# Patient Record
Sex: Female | Born: 1970
Health system: Southern US, Community
[De-identification: ages and names within clinical notes are randomized; demographics above are authoritative.]

## PROBLEM LIST (undated history)

## (undated) DIAGNOSIS — F32A Depression, unspecified: Secondary | ICD-10-CM

## (undated) DIAGNOSIS — I10 Essential (primary) hypertension: Secondary | ICD-10-CM

## (undated) DIAGNOSIS — F419 Anxiety disorder, unspecified: Secondary | ICD-10-CM

## (undated) DIAGNOSIS — F329 Major depressive disorder, single episode, unspecified: Secondary | ICD-10-CM

## (undated) DIAGNOSIS — C189 Malignant neoplasm of colon, unspecified: Secondary | ICD-10-CM

## (undated) HISTORY — DX: Anxiety disorder, unspecified: F41.9

## (undated) HISTORY — DX: Major depressive disorder, single episode, unspecified: F32.9

## (undated) HISTORY — DX: Depression, unspecified: F32.A

---

## 2004-10-12 ENCOUNTER — Emergency Department (HOSPITAL_COMMUNITY): Admission: EM | Admit: 2004-10-12 | Discharge: 2004-10-12 | Payer: Self-pay | Admitting: Emergency Medicine

## 2012-01-16 ENCOUNTER — Other Ambulatory Visit (HOSPITAL_COMMUNITY)
Admission: RE | Admit: 2012-01-16 | Discharge: 2012-01-16 | Disposition: A | Discharge status: Home / Self Care | Payer: Medicaid Other | Source: Ambulatory Visit | Attending: Obstetrics and Gynecology | Admitting: Obstetrics and Gynecology

## 2012-01-16 DIAGNOSIS — Z124 Encounter for screening for malignant neoplasm of cervix: Secondary | ICD-10-CM | POA: Insufficient documentation

## 2012-04-15 ENCOUNTER — Encounter (INDEPENDENT_AMBULATORY_CARE_PROVIDER_SITE_OTHER): Payer: Self-pay | Admitting: Surgery

## 2012-04-20 ENCOUNTER — Ambulatory Visit (INDEPENDENT_AMBULATORY_CARE_PROVIDER_SITE_OTHER): Payer: Self-pay | Admitting: Surgery

## 2012-05-04 ENCOUNTER — Encounter (INDEPENDENT_AMBULATORY_CARE_PROVIDER_SITE_OTHER): Payer: Self-pay | Admitting: Surgery

## 2012-07-27 ENCOUNTER — Encounter (HOSPITAL_COMMUNITY): Payer: Self-pay | Admitting: Emergency Medicine

## 2012-07-27 ENCOUNTER — Emergency Department (HOSPITAL_COMMUNITY)
Admission: EM | Admit: 2012-07-27 | Discharge: 2012-07-28 | Disposition: A | Discharge status: Home / Self Care | Payer: Medicaid Other | Attending: Emergency Medicine | Admitting: Emergency Medicine

## 2012-07-27 DIAGNOSIS — M549 Dorsalgia, unspecified: Secondary | ICD-10-CM

## 2012-07-27 DIAGNOSIS — Z79899 Other long term (current) drug therapy: Secondary | ICD-10-CM | POA: Insufficient documentation

## 2012-07-27 DIAGNOSIS — M546 Pain in thoracic spine: Secondary | ICD-10-CM | POA: Insufficient documentation

## 2012-07-27 DIAGNOSIS — R110 Nausea: Secondary | ICD-10-CM | POA: Insufficient documentation

## 2012-07-27 DIAGNOSIS — R61 Generalized hyperhidrosis: Secondary | ICD-10-CM | POA: Insufficient documentation

## 2012-07-27 DIAGNOSIS — I10 Essential (primary) hypertension: Secondary | ICD-10-CM | POA: Insufficient documentation

## 2012-07-27 HISTORY — DX: Essential (primary) hypertension: I10

## 2012-07-27 NOTE — ED Notes (Signed)
PT. REPORTS UPPER BACK PAIN ONSET LAST NIGHT WITH SLIGHT NAUSEA , DENIES INJURY OR FALL.

## 2012-07-28 ENCOUNTER — Emergency Department (HOSPITAL_COMMUNITY): Payer: Medicaid Other

## 2012-07-28 ENCOUNTER — Encounter (HOSPITAL_COMMUNITY): Payer: Self-pay | Admitting: Emergency Medicine

## 2012-07-28 MED ORDER — FAMOTIDINE 20 MG PO TABS: 20.0000 mg | ORAL_TABLET | Freq: Two times a day (BID) | ORAL | Status: DC

## 2012-07-28 MED ORDER — OMEPRAZOLE 20 MG PO CPDR: 20.0000 mg | DELAYED_RELEASE_CAPSULE | Freq: Every day | ORAL | Status: DC

## 2012-07-28 NOTE — ED Provider Notes (Signed)
Medical screening examination/treatment/procedure(s) were performed by non-physician practitioner and as supervising physician I was immediately available for consultation/collaboration.  Drakkar Medeiros K Marybel Alcott, MD 07/28/12 0404 

## 2012-07-28 NOTE — ED Notes (Signed)
Patient is alert and orientedx4.  Patient was explained discharge instructions and she understood with no questions.   

## 2012-07-28 NOTE — ED Provider Notes (Signed)
History     CSN: [id number]  Arrival date & time 07/27/12  2232   First MD Initiated Contact with Patient 07/27/12 2339      Chief Complaint  Patient presents with  . Back Pain   HPI  History provided by the patient. Patient is a 41 year old female with history of hypertension who presents with complaints of mid upper back pain that began last night. She reports resting and sitting at the time when she began to feel an ache and unusual pain to her left upper mid back area. Patient reports using over-the-counter pain medication for symptoms with some relief and went to sleep. Patient then reports being awoken by the same symptoms earlier this morning around 4 AM. Since that time symptoms have been waxing and waning. Symptoms have been associated with some nausea and sweats. Symptoms seem worse with lying in certain positions. Do not seem worse with any activities. Do not seem to be changed around eating her meal times. Patient denies having similar symptoms previously. She denies any injury or trauma. She denies any strenuous activity. Patient denies having any chest pain, shortness of breath, heart palpitations. Patient does not use any estrogen. She denies any hemoptysis. Denies any recent travel. Denies any previous history of DVT or PE. Pain is not pleuritic.    Past Medical History  Diagnosis Date  . Hypertension     Past Surgical History  Procedure Date  . Cesarean section     No family history on file.  History  Substance Use Topics  . Smoking status: Never Smoker   . Smokeless tobacco: Not on file  . Alcohol Use: No    OB History    Grav Para Term Preterm Abortions TAB SAB Ect Mult Living                  Review of Systems  Constitutional: Positive for diaphoresis. Negative for fever, chills and appetite change.  Respiratory: Negative for cough.   Cardiovascular: Negative for chest pain, palpitations and leg swelling.  Gastrointestinal: Positive for nausea.  Negative for vomiting, abdominal pain, diarrhea and constipation.  Genitourinary: Negative for dysuria, frequency, hematuria and flank pain.  Musculoskeletal: Positive for back pain.  Skin: Negative for rash.  Neurological: Negative for headaches.  All other systems reviewed and are negative.    Allergies  Alka-seltzer  Home Medications   Current Outpatient Rx  Name  Route  Sig  Dispense  Refill  . ASPIRIN 81 MG PO CHEW   Oral   Chew 81 mg by mouth daily as needed. For pain         . HYDROCHLOROTHIAZIDE 25 MG PO TABS   Oral   Take 25 mg by mouth every morning.         [name] METOPROLOL TARTRATE 25 MG PO TABS   Oral   Take 25 mg by mouth 2 (two) times daily.         [name] POTASSIUM CHLORIDE CRYS ER 20 MEQ PO TBCR   Oral   Take 20 mEq by mouth daily with breakfast.           BP 144/87  Pulse 87  Temp 98.9 F (37.2 C) (Oral)  Resp 14  SpO2 100%  LMP 07/22/2012  Physical Exam  Nursing note and vitals reviewed. Constitutional: She is oriented to person, place, and time. She appears well-developed and well-nourished. No distress.  HENT:  Head: Normocephalic and atraumatic.  Neck: Normal range of motion. Neck supple.  No cervical midline tenderness  Cardiovascular: Normal rate and regular rhythm.   No murmur heard. Pulmonary/Chest: Effort normal and breath sounds normal. No respiratory distress. She has no wheezes. She has no rales. She exhibits no tenderness.  Abdominal: Soft. There is no hepatosplenomegaly. There is no tenderness. There is no rebound, no guarding, no CVA tenderness, no tenderness at McBurney's point and negative Murphy's sign.  Musculoskeletal: She exhibits no edema and no tenderness.       Thoracic back: She exhibits tenderness. She exhibits normal range of motion and no bony tenderness.       Back:       Patient with mild tenderness over left but reports this feels different than current complaint symptoms.  Neurological: She is alert and  oriented to person, place, and time.  Skin: Skin is warm and dry.  Psychiatric: She has a normal mood and affect. Her behavior is normal.    ED Course  Procedures   Dg Chest 2 View  07/28/2012  *RADIOLOGY REPORT*  Clinical Data: Mid back pain for 24 hours  CHEST - 2 VIEW  Comparison: None  Findings: Normal heart size, mediastinal contours, and pulmonary vascularity. Lungs clear. Bones unremarkable. No pneumothorax.  IMPRESSION: No acute abnormalities.   Original Report Authenticated By: Ulyses Southward, M.D.      1. Back pain       MDM  11:45PM patient seen and evaluated. Patient appears well in no acute distress. Patient is PERC negative  Patient denies having any chest pain, shortness of breath or heart palpitations. Symptoms have been waxing and waning. They have been occurring at rest. Symptoms are atypical for ACS. Patient has a normal unremarkable EKG and chest x-ray. I discussed with patient possible causes of symptoms including muscle strain. She does appear to have slight tenderness to the back but reports this feels different from her symptoms. Patient is also having nausea and symptoms of sweating with BHS for muscle strain. I also discussed with patient possibilities of GERD.  At this time I recommended she use Tylenol with heat compress over the area as well as and antacid medicines to help see if symptoms improve. Patient will followup with PCP this week.     Date: 07/28/2012  Rate: 76  Rhythm: normal sinus rhythm  QRS Axis: normal  Intervals: normal  ST/T Wave abnormalities: normal  Conduction Disutrbances:none  Narrative Interpretation:   Old EKG Reviewed: none available      Angus Seller, PA 07/28/12 0126

## 2013-09-10 ENCOUNTER — Encounter (HOSPITAL_COMMUNITY): Payer: Self-pay | Admitting: Emergency Medicine

## 2013-09-10 ENCOUNTER — Emergency Department (HOSPITAL_COMMUNITY)
Admission: EM | Admit: 2013-09-10 | Discharge: 2013-09-10 | Disposition: A | Discharge status: Home / Self Care | Payer: Medicaid Other | Attending: Emergency Medicine | Admitting: Emergency Medicine

## 2013-09-10 DIAGNOSIS — IMO0001 Reserved for inherently not codable concepts without codable children: Secondary | ICD-10-CM | POA: Insufficient documentation

## 2013-09-10 DIAGNOSIS — Z79899 Other long term (current) drug therapy: Secondary | ICD-10-CM | POA: Insufficient documentation

## 2013-09-10 DIAGNOSIS — R509 Fever, unspecified: Secondary | ICD-10-CM | POA: Insufficient documentation

## 2013-09-10 DIAGNOSIS — J029 Acute pharyngitis, unspecified: Secondary | ICD-10-CM | POA: Insufficient documentation

## 2013-09-10 DIAGNOSIS — J069 Acute upper respiratory infection, unspecified: Secondary | ICD-10-CM | POA: Insufficient documentation

## 2013-09-10 DIAGNOSIS — I10 Essential (primary) hypertension: Secondary | ICD-10-CM | POA: Insufficient documentation

## 2013-09-10 DIAGNOSIS — R112 Nausea with vomiting, unspecified: Secondary | ICD-10-CM | POA: Insufficient documentation

## 2013-09-10 LAB — RAPID STREP SCREEN (MED CTR MEBANE ONLY): Streptococcus, Group A Screen (Direct): NEGATIVE

## 2013-09-10 MED ORDER — ONDANSETRON HCL 4 MG PO TABS: 4.0000 mg | ORAL_TABLET | Freq: Four times a day (QID) | ORAL | Status: DC

## 2013-09-10 MED ORDER — HYDROCOD POLST-CHLORPHEN POLST 10-8 MG/5ML PO LQCR
5.0000 mL | Freq: Once | ORAL | Status: AC
  Administered 2013-09-10: 5 mL via ORAL
  Filled 2013-09-10: qty 5

## 2013-09-10 MED ORDER — HYDROCOD POLST-CHLORPHEN POLST 10-8 MG/5ML PO LQCR: 5.0000 mL | Freq: Every evening | ORAL | Status: DC | PRN

## 2013-09-10 NOTE — ED Provider Notes (Signed)
CSN: [id number]     Arrival date & time 09/10/13  1607 History   This chart was scribed for non-physician practitioner working with [place], DO by Mercy Moore, ED Scribe. This patient was seen in room WTR5/WTR5 and the patient's care was started at 5:55 PM.   Chief Complaint  Patient presents with  . Influenza   The history is provided by the patient. No language interpreter was used.   HPI Comments: Brittney Young is a 43 y.o. female who presents to the Emergency Department complaining of influenza like symptoms: sore, itchy throat, rhinorrhea, non productive burning cough, body aches, fever onset 4 days ago. Maximum temperature at home measured at 100.3 F. ED temperature measured at 99.8 F. Patient reports 4/5 episodes of emesis yesterday after feeling nauseated. Patient has been managing symptoms with OTC medications, but has not taken anything today. Patient states that her cough is worse when lying down. Patient reports no history of asthma. Nonsmoker. Patient has history of HTN.   Past Medical History  Diagnosis Date  . Hypertension    Past Surgical History  Procedure Laterality Date  . Cesarean section     History reviewed. No pertinent family history. History  Substance Use Topics  . Smoking status: Never Smoker   . Smokeless tobacco: Not on file  . Alcohol Use: No   OB History   Grav Para Term Preterm Abortions TAB SAB Ect Mult Living                 Review of Systems  Constitutional: Positive for fever.  HENT: Positive for rhinorrhea and sore throat.   Respiratory: Positive for cough.   Gastrointestinal: Positive for nausea.  Musculoskeletal: Positive for myalgias.    Allergies  Review of patient's allergies indicates no known allergies.  Home Medications   Current Outpatient Rx  Name  Route  Sig  Dispense  Refill  . hydrochlorothiazide (HYDRODIURIL) 25 MG tablet   Oral   Take 25 mg by mouth every morning.         . metoprolol tartrate  (LOPRESSOR) 25 MG tablet   Oral   Take 25 mg by mouth 2 (two) times daily.         [name] Phenyleph-CPM-DM-APAP (ALKA-SELTZER PLUS COLD & FLU) 01-07-09-250 MG TBEF   Oral   Take 2 tablets by mouth as needed (cold symptoms).         . potassium chloride SA (K-DUR,KLOR-CON) 20 MEQ tablet   Oral   Take 20 mEq by mouth daily with breakfast.          Triage Vitals: BP 141/75  Pulse 100  Temp(Src) 99.8 F (37.7 C) (Oral)  Resp 25  SpO2 100%  LMP 08/17/2013 Physical Exam  Nursing note and vitals reviewed. Constitutional: She is oriented to person, place, and time. She appears well-developed and well-nourished. No distress.  HENT:  Head: Normocephalic and atraumatic.  Nose: Mucosal edema present.  Mouth/Throat: Posterior oropharyngeal erythema present. No oropharyngeal exudate.  Pharynx red without exudate. Nasal mucousa Minimally swollen.   Eyes: EOM are normal.  Neck: Neck supple. No tracheal deviation present.  Cardiovascular: Normal rate and regular rhythm.   No murmur heard. Pulmonary/Chest: Effort normal and breath sounds normal. No respiratory distress. She has no wheezes. She has no rales.  Musculoskeletal: Normal range of motion.  Neurological: She is alert and oriented to person, place, and time.  Skin: Skin is warm and dry. No rash noted.  Psychiatric: She has  a normal mood and affect. Her behavior is normal.    ED Course  Procedures (including critical care time) DIAGNOSTIC STUDIES: Oxygen Saturation is 100% on room air, normal by my interpretation.    COORDINATION OF CARE: 5:59 PM- Will order strep screen and prescribe Tussionex. Pt advised of plan for treatment and pt agrees.    Labs Review Labs Reviewed - No data to display Imaging Review No results found.  EKG Interpretation   None       MDM  No diagnosis found. 1. URI 2. Pharyngitis  Well appearing patient, non-toxic. Symptomatic treatment recommended.  I personally performed the services  described in this documentation, which was scribed in my presence. The recorded information has been reviewed and is accurate.     Dewaine Oats, PA-C 09/11/13 1647

## 2013-09-10 NOTE — Discharge Instructions (Signed)
Cough, Adult  A cough is a reflex that helps clear your throat and airways. It can help heal the body or may be a reaction to an irritated airway. A cough may only last 2 or 3 weeks (acute) or may last more than 8 weeks (chronic).  CAUSES Acute cough:  Viral or bacterial infections. Chronic cough:  Infections.  Allergies.  Asthma.  Post-nasal drip.  Smoking.  Heartburn or acid reflux.  Some medicines.  Chronic lung problems (COPD).  Cancer. SYMPTOMS   Cough.  Fever.  Chest pain.  Increased breathing rate.  High-pitched whistling sound when breathing (wheezing).  Colored mucus that you cough up (sputum). TREATMENT   A bacterial cough may be treated with antibiotic medicine.  A viral cough must run its course and will not respond to antibiotics.  Your caregiver may recommend other treatments if you have a chronic cough. HOME CARE INSTRUCTIONS   Only take over-the-counter or prescription medicines for pain, discomfort, or fever as directed by your caregiver. Use cough suppressants only as directed by your caregiver.  Use a cold steam vaporizer or humidifier in your bedroom or home to help loosen secretions.  Sleep in a semi-upright position if your cough is worse at night.  Rest as needed.  Stop smoking if you smoke. SEEK IMMEDIATE MEDICAL CARE IF:   You have pus in your sputum.  Your cough starts to worsen.  You cannot control your cough with suppressants and are losing sleep.  You begin coughing up blood.  You have difficulty breathing.  You develop pain which is getting worse or is uncontrolled with medicine.  You have a fever. MAKE SURE YOU:   Understand these instructions.  Will watch your condition.  Will get help right away if you are not doing well or get worse. Document Released: 02/21/2011 Document Revised: 11/17/2011 Document Reviewed: 02/21/2011 [facility] Patient Information 2014 [place].  Antibiotic Nonuse  Your  caregiver felt that the infection or problem was not one that would be helped with an antibiotic. Infections may be caused by viruses or bacteria. Only a caregiver can tell which one of these is the likely cause of an illness. A cold is the most common cause of infection in both adults and children. A cold is a virus. Antibiotic treatment will have no effect on a viral infection. Viruses can lead to many lost days of work caring for sick children and many missed days of school. Children may catch as many as 10 "colds" or "flus" per year during which they can be tearful, cranky, and uncomfortable. The goal of treating a virus is aimed at keeping the ill person comfortable. Antibiotics are medications used to help the body fight bacterial infections. There are relatively few types of bacteria that cause infections but there are hundreds of viruses. While both viruses and bacteria cause infection they are very different types of germs. A viral infection will typically go away by itself within 7 to 10 days. Bacterial infections may spread or get worse without antibiotic treatment. Examples of bacterial infections are:  Sore throats (like strep throat or tonsillitis).  Infection in the lung (pneumonia).  Ear and skin infections. Examples of viral infections are:  Colds or flus.  Most coughs and bronchitis.  Sore throats not caused by Strep.  Runny noses. It is often best not to take an antibiotic when a viral infection is the cause of the problem. Antibiotics can kill off the helpful bacteria that we have inside our body  and allow harmful bacteria to start growing. Antibiotics can cause side effects such as allergies, nausea, and diarrhea without helping to improve the symptoms of the viral infection. Additionally, repeated uses of antibiotics can cause bacteria inside of our body to become resistant. That resistance can be passed onto harmful bacterial. The next time you have an infection it may be  harder to treat if antibiotics are used when they are not needed. Not treating with antibiotics allows our own immune system to develop and take care of infections more efficiently. Also, antibiotics will work better for [place] when they are prescribed for bacterial infections. Treatments for a child that is ill may include:  Give extra fluids throughout the day to stay hydrated.  Get plenty of rest.  Only give your child over-the-counter or prescription medicines for pain, discomfort, or fever as directed by your caregiver.  The use of a cool mist humidifier may help stuffy noses.  Cold medications if suggested by your caregiver. Your caregiver may decide to start you on an antibiotic if:  The problem you were seen for today continues for a longer length of time than expected.  You develop a secondary bacterial infection. SEEK MEDICAL CARE IF:  Fever lasts longer than 5 days.  Symptoms continue to get worse after 5 to 7 days or become severe.  Difficulty in breathing develops.  Signs of dehydration develop (poor drinking, rare urinating, dark colored urine).  Changes in behavior or worsening tiredness (listlessness or lethargy). Document Released: 11/03/2001 Document Revised: 11/17/2011 Document Reviewed: 05/02/2009 [facility] Patient Information 2014 [place], [place]. Sore Throat A sore throat is pain, burning, irritation, or scratchiness of the throat. There is often pain or tenderness when swallowing or talking. A sore throat may be accompanied by other symptoms, such as coughing, sneezing, fever, and swollen neck glands. A sore throat is often the first sign of another sickness, such as a cold, flu, strep throat, or mononucleosis (commonly known as mono). Most sore throats go away without medical treatment. CAUSES  The most common causes of a sore throat include:  A viral infection, such as a cold, flu, or mono.  A bacterial infection, such as strep throat, tonsillitis, or whooping  cough.  Seasonal allergies.  Dryness in the air.  Irritants, such as smoke or pollution.  Gastroesophageal reflux disease (GERD). HOME CARE INSTRUCTIONS   Only take over-the-counter medicines as directed by your caregiver.  Drink enough fluids to keep your urine clear or pale yellow.  Rest as needed.  Try using throat sprays, lozenges, or sucking on hard candy to ease any pain (if older than 4 years or as directed).  Sip warm liquids, such as broth, herbal tea, or warm water with honey to relieve pain temporarily. You may also eat or drink cold or frozen liquids such as frozen ice pops.  Gargle with salt water (mix 1 tsp salt with 8 oz of water).  Do not smoke and avoid secondhand smoke.  Put a cool-mist humidifier in your bedroom at night to moisten the air. You can also turn on a hot shower and sit in the bathroom with the door closed for 5 10 minutes. SEEK IMMEDIATE MEDICAL CARE IF:  You have difficulty breathing.  You are unable to swallow fluids, soft foods, or your saliva.  You have increased swelling in the throat.  Your sore throat does not get better in 7 days.  You have nausea and vomiting.  You have a fever or persistent symptoms for more  than 2 3 days.  You have a fever and your symptoms suddenly get worse. MAKE SURE YOU:   Understand these instructions.  Will watch your condition.  Will get help right away if you are not doing well or get worse. Document Released: 10/02/2004 Document Revised: 08/11/2012 Document Reviewed: 05/02/2012 [facility] Patient Information 2014 [place], [place]. Salt Water Gargle This solution will help make your mouth and throat feel better. HOME CARE INSTRUCTIONS   Mix 1 teaspoon of salt in 8 ounces of warm water.  Gargle with this solution as much or often as you need or as directed. Swish and gargle gently if you have any sores or wounds in your mouth.  Do not swallow this mixture. Document Released: 05/29/2004 Document  Revised: 11/17/2011 Document Reviewed: 10/20/2008 [facility] Patient Information 2014 [place].

## 2013-09-10 NOTE — ED Notes (Signed)
She c/o congested cough and body aches, esp. Of low back; x 3-4 days.  She states she vomited a couple of time with cough yesterday--denies any vomiting today.  She is in no distress.

## 2013-09-11 NOTE — ED Provider Notes (Signed)
Medical screening examination/treatment/procedure(s) were performed by non-physician practitioner and as supervising physician I was immediately available for consultation/collaboration.  EKG Interpretation   None         [place], DO 09/11/13 2341

## 2013-09-12 LAB — CULTURE, GROUP A STREP

## 2013-09-13 ENCOUNTER — Ambulatory Visit: Payer: BC Managed Care – PPO | Admitting: Family Medicine

## 2013-09-13 VITALS — BP 150/90 | HR 81 | Temp 98.2°F | Resp 16 | Ht 64.5 in | Wt 175.2 lb

## 2013-09-13 DIAGNOSIS — R059 Cough, unspecified: Secondary | ICD-10-CM

## 2013-09-13 DIAGNOSIS — J069 Acute upper respiratory infection, unspecified: Secondary | ICD-10-CM

## 2013-09-13 DIAGNOSIS — J019 Acute sinusitis, unspecified: Secondary | ICD-10-CM

## 2013-09-13 DIAGNOSIS — R05 Cough: Secondary | ICD-10-CM

## 2013-09-13 MED ORDER — AMOXICILLIN-POT CLAVULANATE 875-125 MG PO TABS: 1.0000 | ORAL_TABLET | Freq: Two times a day (BID) | ORAL | Status: DC

## 2013-09-13 MED ORDER — BENZONATATE 100 MG PO CAPS: 200.0000 mg | ORAL_CAPSULE | Freq: Two times a day (BID) | ORAL | Status: DC | PRN

## 2013-09-13 NOTE — Progress Notes (Signed)
Chief Complaint:  Chief Complaint  Patient presents with  . Cough    was in the ER dxd with URI, not feeling better  . Dizziness    when she moves around, facial tightness  . URI    HPI: Brittney Young is a 43 y.o. female who is here for  1 week history of flu like symptoms, back pain between shoulder  Worse with coughing, she has had some intermittent dizziness Has a cough but not coughing much, back pain and dizziness worse with coughing, she is coughing up green sputum She has had fever T max was 100.3, last one was  Saturday She still feels really bad but not worse She was vomiting before, currently no diarrhea, no rashes.  She was seen in ED for sore throat, and flu like sxs but rapid strp was doen and that was it. No flu test or other blood work The rapid strep test was negative.  Has not had flu vaccine,  No other sick contacts.  She did not take her BP meds today Has been out of work for several days due to illness and would like a note  Past Medical History  Diagnosis Date  . Hypertension   . Depression   . Anxiety    Past Surgical History  Procedure Laterality Date  . Cesarean section     History   Social History  . Marital Status: Single    Spouse Name: N/A    Number of Children: N/A  . Years of Education: N/A   Social History Main Topics  . Smoking status: Never Smoker   . Smokeless tobacco: None  . Alcohol Use: No  . Drug Use: No  . Sexual Activity: None   Other Topics Concern  . None   Social History Narrative  . None   Family History  Problem Relation Age of Onset  . Stroke Mother   . Cancer Sister    No Known Allergies Prior to Admission medications   Medication Sig Start Date End Date Taking? Authorizing Provider  chlorpheniramine-HYDROcodone (TUSSIONEX PENNKINETIC ER) 10-8 MG/5ML LQCR Take 5 mLs by mouth at bedtime as needed for cough. 09/10/13   Shari A Upstill, PA-C  hydrochlorothiazide (HYDRODIURIL) 25 MG tablet Take 25  mg by mouth every morning.    Historical Provider, MD  metoprolol tartrate (LOPRESSOR) 25 MG tablet Take 25 mg by mouth 2 (two) times daily.    Historical Provider, MD  ondansetron (ZOFRAN) 4 MG tablet Take 1 tablet (4 mg total) by mouth every 6 (six) hours. 09/10/13   Shari A Upstill, PA-C  Phenyleph-CPM-DM-APAP (ALKA-SELTZER PLUS COLD & FLU) 01-07-09-250 MG TBEF Take 2 tablets by mouth as needed (cold symptoms).    Historical Provider, MD  potassium chloride SA (K-DUR,KLOR-CON) 20 MEQ tablet Take 20 mEq by mouth daily with breakfast.    Historical Provider, MD     ROS: The patient denies  night sweats, unintentional weight loss, chest pain, palpitations, wheezing, dyspnea on exertion,   abdominal pain, dysuria, hematuria, melena, numbness,  or tingling.   All other systems have been reviewed and were otherwise negative with the exception of those mentioned in the HPI and as above.    PHYSICAL EXAM: Filed Vitals:   09/13/13 1741  BP: 150/90  Pulse: 81  Temp: 98.2 F (36.8 C)  Resp: 16  Spo2 100% Filed Vitals:   09/13/13 1741  Height: 5' 4.5" (1.638 m)  Weight: 175 lb 3.2 oz (  79.47 kg)   Body mass index is 29.62 kg/(m^2).  General: Alert, no acute distress, looks tired HEENT:  Normocephalic, atraumatic, oropharynx patent. EOMI, PERRLA. Tm nl. No exudates. + sinus tenderness.  Cardiovascular:  Regular rate and rhythm, no rubs murmurs or gallops.  No Carotid bruits, radial pulse intact. No pedal edema.  Respiratory: Clear to auscultation bilaterally.  No wheezes, rales, or rhonchi.  No cyanosis, no use of accessory musculature GI: No organomegaly, abdomen is soft and non-tender, positive bowel sounds.  No masses. Skin: No rashes. Neurologic: Facial musculature symmetric. CN 2-12 grossly intact Psychiatric: Patient is appropriate throughout our interaction. Lymphatic: No cervical lymphadenopathy Musculoskeletal: Gait intact.   LABS: Results for orders placed during the hospital  encounter of 09/10/13  RAPID STREP SCREEN      Result Value Range   Streptococcus, Group A Screen (Direct) NEGATIVE  NEGATIVE  CULTURE, GROUP A STREP      Result Value Range   Specimen Description THROAT     Special Requests NONE     Culture       Value: No Beta Hemolytic Streptococci Isolated     Performed at [facility]   Report Status 09/12/2013 FINAL       EKG/XRAY:   Primary read interpreted by Dr. Marin Comment at [facility].   ASSESSMENT/PLAN: Encounter Diagnoses  Name Primary?  . URI, acute Yes  . Acute sinusitis   . Cough     Most likely viral with some sinusitis, has been afebrile so will not do anything more aggressive, sxs are not worse,she just feels she is not improving as fast as she would like Will rx augmentin for acute sinusitis Otc nasocort, continue with rx cough meds She will take BP meds when she is feeling better, push fluids, get up and down slowly, orthostatic BP nl F/u prn Work note given  Gross sideeffects, risk and benefits, and alternatives of medications d/w patient. Patient is aware that all medications have potential sideeffects and we are unable to predict every sideeffect or drug-drug interaction that may occur.  Lavenia Stumpo, [place], DO 09/13/2013 6:29 PM

## 2014-03-08 ENCOUNTER — Other Ambulatory Visit (HOSPITAL_COMMUNITY): Payer: Self-pay | Admitting: Physician Assistant

## 2014-03-08 ENCOUNTER — Telehealth (HOSPITAL_COMMUNITY): Payer: Self-pay | Admitting: *Deleted

## 2014-03-08 DIAGNOSIS — R079 Chest pain, unspecified: Secondary | ICD-10-CM

## 2014-03-14 ENCOUNTER — Telehealth (HOSPITAL_COMMUNITY): Payer: Self-pay

## 2014-03-14 NOTE — Telephone Encounter (Signed)
Encounter complete. 

## 2014-03-15 ENCOUNTER — Telehealth (HOSPITAL_COMMUNITY): Payer: Self-pay

## 2014-03-15 NOTE — Telephone Encounter (Signed)
Encounter complete. 

## 2014-03-16 ENCOUNTER — Ambulatory Visit (HOSPITAL_COMMUNITY)
Admission: RE | Admit: 2014-03-16 | Discharge: 2014-03-16 | Disposition: A | Discharge status: Home / Self Care | Payer: BC Managed Care – PPO | Source: Ambulatory Visit | Attending: Internal Medicine | Admitting: Internal Medicine

## 2014-03-16 DIAGNOSIS — R079 Chest pain, unspecified: Secondary | ICD-10-CM | POA: Diagnosis not present

## 2014-03-16 NOTE — Procedures (Signed)
Exercise Treadmill Test   Test  Exercise Tolerance Test Ordering MD: Lennie Odor, PA-C  Interpreting MD:  Unique Test No: 1   Treadmill:  1  Indication for ETT: Dizziness  Contraindication to ETT: Yes   Stress Modality: exercise - treadmill  Cardiac Imaging Performed: non   Protocol: standard Bruce - maximal  Max BP:  157/84  Max MPHR (bpm): 179 85% MPR (bpm): 152  MPHR obtained (bpm):  164 % MPHR obtained:  91  Reached 85% MPHR (min:sec):  5:30 Total Exercise Time (min-sec): 6:14  Workload in METS: 7.30 Borg Scale:   Reason ETT Terminated:  fatigue    ST Segment Analysis At Rest: NSR, nonspecific ST changes. With Exercise: no evidence of significant ST depression  Other Information Arrhythmia:  No Angina during ETT:  absent (0) Quality of ETT:  diagnostic  ETT Interpretation:  normal - no evidence of ischemia by ST analysis  Comments: ETT with no chest pain; fair exercise capacity; normal BP response; no ST changes; negative adequate ETT.  Kirk Ruths

## 2014-04-14 ENCOUNTER — Other Ambulatory Visit: Payer: Self-pay | Admitting: Physician Assistant

## 2014-04-14 ENCOUNTER — Other Ambulatory Visit (HOSPITAL_COMMUNITY)
Admission: RE | Admit: 2014-04-14 | Discharge: 2014-04-14 | Disposition: A | Discharge status: Home / Self Care | Payer: BC Managed Care – PPO | Source: Ambulatory Visit | Attending: Family Medicine | Admitting: Family Medicine

## 2014-04-14 DIAGNOSIS — N6320 Unspecified lump in the left breast, unspecified quadrant: Secondary | ICD-10-CM

## 2014-04-14 DIAGNOSIS — Z124 Encounter for screening for malignant neoplasm of cervix: Secondary | ICD-10-CM | POA: Insufficient documentation

## 2014-04-18 LAB — CYTOLOGY - PAP

## 2014-04-19 ENCOUNTER — Ambulatory Visit
Admission: RE | Admit: 2014-04-19 | Discharge: 2014-04-19 | Disposition: A | Discharge status: Home / Self Care | Payer: BC Managed Care – PPO | Source: Ambulatory Visit | Attending: Physician Assistant | Admitting: Physician Assistant

## 2014-04-19 ENCOUNTER — Other Ambulatory Visit: Payer: Self-pay | Admitting: Physician Assistant

## 2014-04-19 DIAGNOSIS — N6320 Unspecified lump in the left breast, unspecified quadrant: Secondary | ICD-10-CM

## 2014-04-24 ENCOUNTER — Other Ambulatory Visit: Payer: Self-pay | Admitting: Physician Assistant

## 2014-04-24 DIAGNOSIS — N6320 Unspecified lump in the left breast, unspecified quadrant: Secondary | ICD-10-CM

## 2014-04-26 ENCOUNTER — Ambulatory Visit
Admission: RE | Admit: 2014-04-26 | Discharge: 2014-04-26 | Disposition: A | Discharge status: Home / Self Care | Payer: BC Managed Care – PPO | Source: Ambulatory Visit | Attending: Physician Assistant | Admitting: Physician Assistant

## 2014-04-26 DIAGNOSIS — N6320 Unspecified lump in the left breast, unspecified quadrant: Secondary | ICD-10-CM

## 2014-04-27 ENCOUNTER — Other Ambulatory Visit: Payer: Self-pay | Admitting: Physician Assistant

## 2014-04-27 DIAGNOSIS — R921 Mammographic calcification found on diagnostic imaging of breast: Secondary | ICD-10-CM

## 2014-04-28 ENCOUNTER — Ambulatory Visit
Admission: RE | Admit: 2014-04-28 | Discharge: 2014-04-28 | Disposition: A | Discharge status: Home / Self Care | Payer: BC Managed Care – PPO | Source: Ambulatory Visit | Attending: Physician Assistant | Admitting: Physician Assistant

## 2014-04-28 DIAGNOSIS — R921 Mammographic calcification found on diagnostic imaging of breast: Secondary | ICD-10-CM

## 2014-06-30 ENCOUNTER — Other Ambulatory Visit: Payer: Self-pay | Admitting: Physician Assistant

## 2014-06-30 ENCOUNTER — Ambulatory Visit (INDEPENDENT_AMBULATORY_CARE_PROVIDER_SITE_OTHER): Payer: BC Managed Care – PPO | Admitting: Physician Assistant

## 2014-06-30 ENCOUNTER — Encounter: Payer: Self-pay | Admitting: Physician Assistant

## 2014-06-30 VITALS — BP 133/88 | HR 82 | Ht 64.5 in | Wt 174.0 lb

## 2014-06-30 DIAGNOSIS — A6000 Herpesviral infection of urogenital system, unspecified: Secondary | ICD-10-CM

## 2014-06-30 DIAGNOSIS — R5383 Other fatigue: Secondary | ICD-10-CM | POA: Diagnosis not present

## 2014-06-30 DIAGNOSIS — I10 Essential (primary) hypertension: Secondary | ICD-10-CM

## 2014-06-30 NOTE — Progress Notes (Signed)
   Subjective:    Patient ID: Brittney Young, female    DOB: 06/28/1971, 43 y.o.   MRN: [id number]  HPI  Patient is a 43 year old female who presents to the clinic to establish care.  .. Active Ambulatory Problems    Diagnosis Date Noted  . Essential hypertension, benign 06/30/2014  . Genital herpes 06/30/2014  . Anemia 07/03/2014  . Vitamin D deficiency 07/03/2014  . Other fatigue 07/03/2014   Resolved Ambulatory Problems    Diagnosis Date Noted  . No Resolved Ambulatory Problems   Past Medical History  Diagnosis Date  . Hypertension   . Depression   . Anxiety    .[name] Family History  Problem Relation Age of Onset  . Stroke Mother   . Cancer Sister    .[name] History   Social History  . Marital Status: Single    Spouse Name: N/A    Number of Children: N/A  . Years of Education: N/A   Occupational History  . Not on file.   Social History Main Topics  . Smoking status: Never Smoker   . Smokeless tobacco: Not on file  . Alcohol Use: No  . Drug Use: No  . Sexual Activity: Not on file   Other Topics Concern  . Not on file   Social History Narrative  . No narrative on file   Hypertension-patient would like refills on her medication. She denies any chest pain, palpitations, headaches or vision changes.  Patient does have a history of anemia. This has not been checked recently. She complains of ongoing fatigue. She also admits to working second shift and getting 3-4 hours of rest at night. She also has heavy periods that are regular. She is currently on her cycle.  Patient also reports a history of general herpes. She occasionally will have outbreaks that are very painful and treatment leaves her not to be able to work. She'll like to be written out for 1-2 days as needed a month for FMLA.     Review of Systems  All other systems reviewed and are negative.      Objective:   Physical Exam  Constitutional: She is oriented to person, place, and time. She  appears well-developed and well-nourished.  HENT:  Head: Normocephalic and atraumatic.  Cardiovascular: Normal rate, regular rhythm and normal heart sounds.   Pulmonary/Chest: Effort normal and breath sounds normal. She has no wheezes.  Neurological: She is alert and oriented to person, place, and time.  Skin: Skin is dry.  Psychiatric: She has a normal mood and affect. Her behavior is normal.          Assessment & Plan:  HTN-Refilled medications for 6 months.  Fatigue/anemia- ordered labs today to evaluate thyroid, CBC, ferritin, B12, vitamin D and CMP. I do think a lot of patient's fatigue could be coming from her sleeping. She works second shift and then has a young child to which she is up a lot of nights for. I'm not sure there is anything she can do about this except change her shift at work. Discussed with patient importance of sleep.  General herpes-discuss with patient that are needed records proving diagnosis. Also she like FMLA papers filled out I need a separate visit because I do not have time with establishing of care to fill out FMLA paperwork.  Per pt has had fasting labs at [facility] in September. Would like to have records to update system.

## 2014-07-01 LAB — CBC WITH DIFFERENTIAL/PLATELET
BASOS PCT: 0 % (ref 0–1)
Basophils Absolute: 0 10*3/uL (ref 0.0–0.1)
Eosinophils Absolute: 0 10*3/uL (ref 0.0–0.7)
Eosinophils Relative: 0 % (ref 0–5)
HEMATOCRIT: 32.2 % — AB (ref 36.0–46.0)
Hemoglobin: 10.3 g/dL — ABNORMAL LOW (ref 12.0–15.0)
LYMPHS ABS: 1.5 10*3/uL (ref 0.7–4.0)
LYMPHS PCT: 33 % (ref 12–46)
MCH: 29.9 pg (ref 26.0–34.0)
MCHC: 32 g/dL (ref 30.0–36.0)
MCV: 93.6 fL (ref 78.0–100.0)
Monocytes Absolute: 0.2 10*3/uL (ref 0.1–1.0)
Monocytes Relative: 5 % (ref 3–12)
Neutro Abs: 2.8 10*3/uL (ref 1.7–7.7)
Neutrophils Relative %: 62 % (ref 43–77)
Platelets: 233 10*3/uL (ref 150–400)
RBC: 3.44 MIL/uL — AB (ref 3.87–5.11)
RDW: 15.5 % (ref 11.5–15.5)
WBC: 4.5 10*3/uL (ref 4.0–10.5)

## 2014-07-01 LAB — IBC PANEL
%SAT: 18 % — AB (ref 20–55)
TIBC: 281 ug/dL (ref 250–470)
UIBC: 230 ug/dL (ref 125–400)

## 2014-07-01 LAB — IRON: Iron: 51 ug/dL (ref 42–145)

## 2014-07-01 LAB — TSH: TSH: 1.942 u[IU]/mL (ref 0.350–4.500)

## 2014-07-01 LAB — VITAMIN B12: Vitamin B-12: 356 pg/mL (ref 211–911)

## 2014-07-01 LAB — FERRITIN: Ferritin: 40 ng/mL (ref 10–291)

## 2014-07-01 LAB — VITAMIN D 25 HYDROXY (VIT D DEFICIENCY, FRACTURES): Vit D, 25-Hydroxy: 13 ng/mL — ABNORMAL LOW (ref 30–89)

## 2014-07-03 ENCOUNTER — Other Ambulatory Visit: Payer: Self-pay | Admitting: Physician Assistant

## 2014-07-03 ENCOUNTER — Encounter: Payer: Self-pay | Admitting: Physician Assistant

## 2014-07-03 DIAGNOSIS — E559 Vitamin D deficiency, unspecified: Secondary | ICD-10-CM | POA: Insufficient documentation

## 2014-07-03 DIAGNOSIS — D649 Anemia, unspecified: Secondary | ICD-10-CM | POA: Insufficient documentation

## 2014-07-03 DIAGNOSIS — R5383 Other fatigue: Secondary | ICD-10-CM | POA: Insufficient documentation

## 2014-07-03 MED ORDER — VITAMIN D (ERGOCALCIFEROL) 1.25 MG (50000 UNIT) PO CAPS: 50000.0000 [IU] | ORAL_CAPSULE | ORAL | Status: DC

## 2014-07-07 ENCOUNTER — Ambulatory Visit (INDEPENDENT_AMBULATORY_CARE_PROVIDER_SITE_OTHER): Payer: BC Managed Care – PPO | Admitting: Physician Assistant

## 2014-07-07 ENCOUNTER — Encounter: Payer: Self-pay | Admitting: Physician Assistant

## 2014-07-07 VITALS — BP 129/83 | HR 86 | Ht 64.5 in | Wt 175.0 lb

## 2014-07-07 DIAGNOSIS — A6000 Herpesviral infection of urogenital system, unspecified: Secondary | ICD-10-CM

## 2014-07-07 MED ORDER — ACYCLOVIR 400 MG PO TABS: 400.0000 mg | ORAL_TABLET | Freq: Three times a day (TID) | ORAL | Status: DC

## 2014-07-07 NOTE — Progress Notes (Signed)
   Subjective:    Patient ID: Brittney Young, female    DOB: 06-10-1971, 43 y.o.   MRN: [id number]  HPI Pt presents to the clinic for FMLA paperwork to be filled out for outbreaks of genital herpes. Per pt dx of genital herpes in 2008. Never treated in our office. Treated last by Dr. Arnoldo Morale in [place] within the last 2 months. When she has outbreak she is in a lot of pain as well as feels very achy and ran down.  She would like paperwork filled out to be able to miss for outbreak.    Review of Systems  All other systems reviewed and are negative.      Objective:   Physical Exam  Constitutional: She appears well-developed and well-nourished.  Psychiatric: She has a normal mood and affect. Her behavior is normal.          Assessment & Plan:  Genital herpes- FMLA paperwork filled out for 1 time up to 2 days every 2 months. Gave rx for outbreak acyclovir. If having more frequently than this pt needs to consider daily suppress therapy.

## 2014-07-08 ENCOUNTER — Emergency Department (INDEPENDENT_AMBULATORY_CARE_PROVIDER_SITE_OTHER)
Admission: EM | Admit: 2014-07-08 | Discharge: 2014-07-08 | Disposition: A | Discharge status: Home / Self Care | Payer: BC Managed Care – PPO | Source: Home / Self Care | Attending: Emergency Medicine | Admitting: Emergency Medicine

## 2014-07-08 ENCOUNTER — Encounter: Payer: Self-pay | Admitting: Emergency Medicine

## 2014-07-08 DIAGNOSIS — J029 Acute pharyngitis, unspecified: Secondary | ICD-10-CM

## 2014-07-08 LAB — POCT RAPID STREP A (OFFICE): Rapid Strep A Screen: NEGATIVE

## 2014-07-08 NOTE — ED Notes (Signed)
Intermittent sore throat without fever since Thursday.  Woke up this AM with hoarseness.

## 2014-07-08 NOTE — Discharge Instructions (Signed)
Sore Throat A sore throat is pain, burning, irritation, or scratchiness of the throat. There is often pain or tenderness when swallowing or talking. A sore throat may be accompanied by other symptoms, such as coughing, sneezing, fever, and swollen neck glands. A sore throat is often the first sign of another sickness, such as a cold, flu, strep throat, or mononucleosis (commonly known as mono). Most sore throats go away without medical treatment. CAUSES  The most common causes of a sore throat include:  A viral infection, such as a cold, flu, or mono.  A bacterial infection, such as strep throat, tonsillitis, or whooping cough.  Seasonal allergies.  Dryness in the air.  Irritants, such as smoke or pollution.  Gastroesophageal reflux disease (GERD). HOME CARE INSTRUCTIONS   Only take over-the-counter medicines as directed by your caregiver.  Drink enough fluids to keep your urine clear or pale yellow.  Rest as needed.  Try using throat sprays, lozenges, or sucking on hard candy to ease any pain (if older than 4 years or as directed).  Sip warm liquids, such as broth, herbal tea, or warm water with honey to relieve pain temporarily. You may also eat or drink cold or frozen liquids such as frozen ice pops.  Gargle with salt water (mix 1 tsp salt with 8 oz of water).  Do not smoke and avoid secondhand smoke.  Put a cool-mist humidifier in your bedroom at night to moisten the air. You can also turn on a hot shower and sit in the bathroom with the door closed for 5-10 minutes. SEEK IMMEDIATE MEDICAL CARE IF:  You have difficulty breathing.  You are unable to swallow fluids, soft foods, or your saliva.  You have increased swelling in the throat.  Your sore throat does not get better in 7 days.  You have nausea and vomiting.  You have a fever or persistent symptoms for more than 2-3 days.  You have a fever and your symptoms suddenly get worse. MAKE SURE YOU:   Understand  these instructions.  Will watch your condition.  Will get help right away if you are not doing well or get worse. Document Released: 10/02/2004 Document Revised: 08/11/2012 Document Reviewed: 05/02/2012 [facility] Patient Information 2015 [place], [place]. This information is not intended to replace advice given to you by your health care provider. Make sure you discuss any questions you have with your health care provider. Viral Infections A virus is a type of germ. Viruses can cause:  Minor sore throats.  Aches and pains.  Headaches.  Runny nose.  Rashes.  Watery eyes.  Tiredness.  Coughs.  Loss of appetite.  Feeling sick to your stomach (nausea).  Throwing up (vomiting).  Watery poop (diarrhea). HOME CARE   Only take medicines as told by your doctor.  Drink enough water and fluids to keep your pee (urine) clear or pale yellow. Sports drinks are a good choice.  Get plenty of rest and eat healthy. Soups and broths with crackers or rice are fine. GET HELP RIGHT AWAY IF:   You have a very bad headache.  You have shortness of breath.  You have chest pain or neck pain.  You have an unusual rash.  You cannot stop throwing up.  You have watery poop that does not stop.  You cannot keep fluids down.  You or your child has a temperature by mouth above 102 F (38.9 C), not controlled by medicine.  Your baby is older than 3 months with a  rectal temperature of 102 F (38.9 C) or higher.  Your baby is 66 months old or younger with a rectal temperature of 100.4 F (38 C) or higher. MAKE SURE YOU:   Understand these instructions.  Will watch this condition.  Will get help right away if you are not doing well or get worse. Document Released: 08/07/2008 Document Revised: 11/17/2011 Document Reviewed: 12/31/2010 [facility] Patient Information 2015 [place], [place]. This information is not intended to replace advice given to you by your health care provider. Make sure  you discuss any questions you have with your health care provider.

## 2014-07-08 NOTE — ED Provider Notes (Signed)
CSN: [id number]     Arrival date & time 07/08/14  1254 History   First MD Initiated Contact with Patient 07/08/14 1318     Chief Complaint  Patient presents with  . Sore Throat   (Consider location/radiation/quality/duration/timing/severity/associated sxs/prior Treatment) Patient is a 43 y.o. female presenting with pharyngitis. The history is provided by the patient. No language interpreter was used.  Sore Throat This is a new problem. The problem occurs constantly. The problem has been gradually worsening. Nothing aggravates the symptoms. Nothing relieves the symptoms. She has tried nothing for the symptoms. The treatment provided no relief.    Past Medical History  Diagnosis Date  . Hypertension   . Depression   . Anxiety    Past Surgical History  Procedure Laterality Date  . Cesarean section     Family History  Problem Relation Age of Onset  . Stroke Mother   . Cancer Sister    History  Substance Use Topics  . Smoking status: Never Smoker   . Smokeless tobacco: Not on file  . Alcohol Use: No   OB History   Grav Para Term Preterm Abortions TAB SAB Ect Mult Living                 Review of Systems  Unable to perform ROS HENT: Positive for rhinorrhea and sore throat.   All other systems reviewed and are negative.   Allergies  Review of patient's allergies indicates no known allergies.  Home Medications   Prior to Admission medications   Medication Sig Start Date End Date Taking? Authorizing Provider  acyclovir (ZOVIRAX) 400 MG tablet Take 1 tablet (400 mg total) by mouth 3 (three) times daily. For 5 days for outbreak. 07/07/14   Jade L Breeback, PA-C  hydrochlorothiazide (HYDRODIURIL) 25 MG tablet Take 25 mg by mouth every morning.    Historical Provider, MD  metoprolol tartrate (LOPRESSOR) 25 MG tablet Take 25 mg by mouth 2 (two) times daily.    Historical Provider, MD  potassium chloride SA (K-DUR,KLOR-CON) 20 MEQ tablet Take 20 mEq by mouth daily with  breakfast.    Historical Provider, MD  Vitamin D, Ergocalciferol, (DRISDOL) 50000 UNITS CAPS capsule Take 1 capsule (50,000 Units total) by mouth every 7 (seven) days. 07/03/14   Jade L Breeback, PA-C   BP 145/86  Pulse 87  Temp(Src) 98.2 F (36.8 C) (Oral)  Resp 18  Ht 5\' 4"  (1.626 m)  Wt 171 lb (77.565 kg)  BMI 29.34 kg/m2  SpO2 99%  LMP 06/25/2014 Physical Exam  Nursing note and vitals reviewed. Constitutional: She appears well-developed and well-nourished.  HENT:  Head: Normocephalic.  Right Ear: External ear normal.  Left Ear: External ear normal.  Nose: Nose normal.  Erythema pharnyx  Eyes: Conjunctivae are normal. Pupils are equal, round, and reactive to light.  Neck: Normal range of motion. Neck supple.  Cardiovascular: Normal rate.   Pulmonary/Chest: Effort normal.  Abdominal: Soft.  Musculoskeletal: Normal range of motion.  Neurological: She is alert.  Skin: Skin is warm.  Psychiatric: She has a normal mood and affect.    ED Course  Procedures (including critical care time) Labs Review Labs Reviewed  POCT RAPID STREP A (OFFICE)    Imaging Review No results found.   MDM I suspect viral etiology of sore throat,  patient has family that also has an upper respiratory infection. Patient is advised warm salt water gargles lozenges Tylenol for fever pain and body aches. Patient is advised to see  her physician for recheck if symptoms persist more than 1 week.    1. Acute pharyngitis, unspecified pharyngitis type     AVS  Fransico Meadow, PA-C 07/08/14 1405

## 2014-08-16 ENCOUNTER — Other Ambulatory Visit: Payer: Self-pay | Admitting: Physician Assistant

## 2014-09-19 ENCOUNTER — Other Ambulatory Visit: Payer: Self-pay | Admitting: Physician Assistant

## 2014-10-06 ENCOUNTER — Telehealth: Payer: Self-pay | Admitting: *Deleted

## 2014-10-06 DIAGNOSIS — E876 Hypokalemia: Secondary | ICD-10-CM

## 2014-10-06 LAB — CMP14+LP+1AC+CBC/D/PLT+TSH
CALCIUM: 9.3 mg/dL
Chloride, Serum: 100
PROTEIN, TOTAL - PEBFLD: 8.4

## 2014-10-06 NOTE — Telephone Encounter (Signed)
Pt left vm asking for a refill on her potassium.  I called her back & left her a vm stating that we would need an updated potassium level.  She called me back & left vm stating that she can't take off work to come to the lab but that they can draw labs at her job, all we would need to do it send what we need drawn & a note or something verifying that it's ok for them to draw.  Are you ok with this?

## 2014-10-06 NOTE — Telephone Encounter (Signed)
LMOM for pt to return call. 

## 2014-10-06 NOTE — Telephone Encounter (Signed)
Yes ok with draw at office. Make sure she isn't going to run out of potassium before we get results.

## 2014-10-06 NOTE — Telephone Encounter (Signed)
Faxed lab order & letter to [place]. Fax # [phone].  Pt notified.

## 2014-10-09 LAB — BASIC METABOLIC PANEL
BUN: 17 mg/dL (ref 4–21)
Creatinine: 0.8 mg/dL (ref 0.5–1.1)
GLUCOSE: 80 mg/dL
Potassium: 4.2 mmol/L (ref 3.4–5.3)
Sodium: 137 mmol/L (ref 137–147)

## 2014-10-09 LAB — HEPATIC FUNCTION PANEL
ALT: 18 U/L (ref 7–35)
AST: 19 U/L (ref 13–35)
Alkaline Phosphatase: 62 U/L (ref 25–125)
Bilirubin, Total: 0.2 mg/dL

## 2014-10-16 NOTE — Telephone Encounter (Signed)
The CMP ordered was due to her asking for a refill of her potassium.  Is that ok to fill?

## 2014-10-16 NOTE — Telephone Encounter (Signed)
Got labs from outside laboratory. CMP and were good despite some minor protein abnormalities. Vitamin D and iron were not checked to see your response to vitamin d and oral iron. Can we order these? Ferritin, cbc, vitamin D

## 2014-10-16 NOTE — Telephone Encounter (Signed)
LMOM for pt to call back to tell me where to send potassium refill.

## 2014-10-16 NOTE — Telephone Encounter (Signed)
Yes ok to fill. Potassium normal.

## 2014-10-25 ENCOUNTER — Other Ambulatory Visit: Payer: Self-pay | Admitting: *Deleted

## 2014-10-25 MED ORDER — POTASSIUM CHLORIDE CRYS ER 20 MEQ PO TBCR: 20.0000 meq | EXTENDED_RELEASE_TABLET | Freq: Every day | ORAL | Status: DC

## 2014-10-25 NOTE — Telephone Encounter (Signed)
Pt called back with pharm.  rx sent.

## 2014-11-17 ENCOUNTER — Encounter: Payer: Self-pay | Admitting: Physician Assistant

## 2014-12-12 ENCOUNTER — Telehealth: Payer: Self-pay | Admitting: *Deleted

## 2014-12-12 NOTE — Telephone Encounter (Signed)
Called patient to instruct about refill & office visit

## 2014-12-12 NOTE — Telephone Encounter (Signed)
Ok to refill metoprolol this month only. Needs 6 month follow up.

## 2014-12-13 MED ORDER — METOPROLOL TARTRATE 25 MG PO TABS: 25.0000 mg | ORAL_TABLET | Freq: Two times a day (BID) | ORAL | Status: DC

## 2014-12-13 NOTE — Telephone Encounter (Signed)
Closing previous documentation

## 2014-12-18 ENCOUNTER — Ambulatory Visit (INDEPENDENT_AMBULATORY_CARE_PROVIDER_SITE_OTHER): Payer: BLUE CROSS/BLUE SHIELD | Admitting: Physician Assistant

## 2014-12-18 ENCOUNTER — Encounter: Payer: Self-pay | Admitting: Physician Assistant

## 2014-12-18 VITALS — BP 131/93 | HR 85 | Wt 183.0 lb

## 2014-12-18 DIAGNOSIS — A6000 Herpesviral infection of urogenital system, unspecified: Secondary | ICD-10-CM

## 2014-12-18 DIAGNOSIS — F329 Major depressive disorder, single episode, unspecified: Secondary | ICD-10-CM

## 2014-12-18 DIAGNOSIS — F418 Other specified anxiety disorders: Secondary | ICD-10-CM

## 2014-12-18 DIAGNOSIS — I10 Essential (primary) hypertension: Secondary | ICD-10-CM

## 2014-12-18 DIAGNOSIS — F32A Depression, unspecified: Secondary | ICD-10-CM

## 2014-12-18 DIAGNOSIS — E559 Vitamin D deficiency, unspecified: Secondary | ICD-10-CM | POA: Diagnosis not present

## 2014-12-18 DIAGNOSIS — F419 Anxiety disorder, unspecified: Secondary | ICD-10-CM

## 2014-12-18 MED ORDER — HYDROCHLOROTHIAZIDE 25 MG PO TABS: 25.0000 mg | ORAL_TABLET | Freq: Every day | ORAL | Status: DC

## 2014-12-18 MED ORDER — POTASSIUM CHLORIDE CRYS ER 20 MEQ PO TBCR: 20.0000 meq | EXTENDED_RELEASE_TABLET | Freq: Every day | ORAL | Status: DC

## 2014-12-18 MED ORDER — METOPROLOL TARTRATE 25 MG PO TABS: 25.0000 mg | ORAL_TABLET | Freq: Two times a day (BID) | ORAL | Status: DC

## 2014-12-18 MED ORDER — ACYCLOVIR 400 MG PO TABS: ORAL_TABLET | ORAL | Status: DC

## 2014-12-18 MED ORDER — VITAMIN D (ERGOCALCIFEROL) 1.25 MG (50000 UNIT) PO CAPS: 50000.0000 [IU] | ORAL_CAPSULE | ORAL | Status: DC

## 2014-12-18 MED ORDER — CITALOPRAM HYDROBROMIDE 10 MG PO TABS: 10.0000 mg | ORAL_TABLET | Freq: Every day | ORAL | Status: DC

## 2014-12-21 NOTE — Progress Notes (Signed)
   Subjective:    Patient ID: Brittney Young, female    DOB: [birth year], 44 y.o.   MRN: [id number]  HPI Pt presents to the clinic for 6 month refill.   HTN- doing well. No concerns or complaints. No SOB, CP, palpitations, headache or vision changes.   Genital herpes- no current outbreak. She has had a few due to stress. Needs refill.   Vitamin D- admits to not taking.   She does feel more anxious and depressed. Her ex-husband is giving her a hard time. She is struggling financially. No sucidal or homicidal thoughts. She works weird hours and has a 3 year that does not sleep well.    Review of Systems  All other systems reviewed and are negative.      Objective:   Physical Exam  Constitutional: She is oriented to person, place, and time. She appears well-developed and well-nourished.  HENT:  Head: Normocephalic and atraumatic.  Cardiovascular: Normal rate, regular rhythm and normal heart sounds.   Pulmonary/Chest: Effort normal and breath sounds normal.  Neurological: She is oriented to person, place, and time.  Skin: Skin is dry.  Psychiatric: She has a normal mood and affect. Her behavior is normal.          Assessment & Plan:  HTN- refilled medications for 6 months. Admits to being out of HCTZ. Could be the cause over diastyloic elevation. Discussed low salt diet.   Genital herpes- refills for outbreaks given.   Vitamin D deficiency discussed this can make you feel better. Given refill for weekly D3 for 3 months then needs refill.   Anxiety and depression-GAD-7 was 12. PHQ-9 was 10. Started celexa. Discussed SE's. Call with any worsening of anxiety or depression. Follow up in 8month.

## 2015-01-08 ENCOUNTER — Ambulatory Visit: Payer: BLUE CROSS/BLUE SHIELD | Admitting: Family Medicine

## 2015-02-09 ENCOUNTER — Ambulatory Visit: Payer: BLUE CROSS/BLUE SHIELD | Admitting: Family Medicine

## 2015-02-12 ENCOUNTER — Encounter: Payer: Self-pay | Admitting: Family Medicine

## 2015-02-12 ENCOUNTER — Ambulatory Visit (INDEPENDENT_AMBULATORY_CARE_PROVIDER_SITE_OTHER): Payer: BLUE CROSS/BLUE SHIELD | Admitting: Family Medicine

## 2015-02-12 VITALS — BP 128/87 | HR 70 | Wt 173.0 lb

## 2015-02-12 DIAGNOSIS — I10 Essential (primary) hypertension: Secondary | ICD-10-CM

## 2015-02-12 DIAGNOSIS — F419 Anxiety disorder, unspecified: Secondary | ICD-10-CM

## 2015-02-12 DIAGNOSIS — F418 Other specified anxiety disorders: Secondary | ICD-10-CM

## 2015-02-12 DIAGNOSIS — F329 Major depressive disorder, single episode, unspecified: Secondary | ICD-10-CM

## 2015-02-12 DIAGNOSIS — F32A Depression, unspecified: Secondary | ICD-10-CM

## 2015-02-12 MED ORDER — METOPROLOL TARTRATE 25 MG PO TABS: 25.0000 mg | ORAL_TABLET | Freq: Two times a day (BID) | ORAL | Status: DC

## 2015-02-12 MED ORDER — POTASSIUM CHLORIDE CRYS ER 20 MEQ PO TBCR: 20.0000 meq | EXTENDED_RELEASE_TABLET | Freq: Every day | ORAL | Status: DC

## 2015-02-12 MED ORDER — CITALOPRAM HYDROBROMIDE 20 MG PO TABS: 20.0000 mg | ORAL_TABLET | Freq: Every day | ORAL | Status: DC

## 2015-02-12 MED ORDER — HYDROCHLOROTHIAZIDE 25 MG PO TABS: 25.0000 mg | ORAL_TABLET | Freq: Every day | ORAL | Status: DC

## 2015-02-12 NOTE — Progress Notes (Signed)
CC: Brittney Young is a 44 y.o. female is here for f/u depression and anxiety   Subjective: HPI:  Follow-up essential hypertension: Continues to take metoprolol and hydrochlorothiazide on a daily basis. No outside blood pressures report. No chest pain shortness of breath orthopnea nor peripheral edema. Denies any motor or sensory disturbances other than insomnia.  Follow-up anxiety and depression: Since starting citalopram she tells me that she's noticed that significant difference and improvement with irritability and now not allowing others to bring her subjective mood down. She had nausea the first couple days she was taking this but denies any known side effects. She denies any degree of depression but still reports waking up in the middle the night for no particular reason thinking of all the things that stress her out in life. She gets interrupted sleep on a nightly basis due to this often she can't fall back asleep for hours.   Review Of Systems Outlined In HPI  Past Medical History  Diagnosis Date  . Hypertension   . Depression   . Anxiety     Past Surgical History  Procedure Laterality Date  . Cesarean section     Family History  Problem Relation Age of Onset  . Stroke Mother   . Cancer Sister     History   Social History  . Marital Status: Single    Spouse Name: N/A  . Number of Children: N/A  . Years of Education: N/A   Occupational History  . Not on file.   Social History Main Topics  . Smoking status: Never Smoker   . Smokeless tobacco: Not on file  . Alcohol Use: No  . Drug Use: No  . Sexual Activity: Not on file   Other Topics Concern  . Not on file   Social History Narrative     Objective: BP 128/87 mmHg  Pulse 70  Wt 173 lb (78.472 kg)  General: Alert and Oriented, No Acute Distress HEENT: Pupils equal, round, reactive to light. Conjunctivae clear.  Moist mucous membranes Lungs: Clear to auscultation bilaterally, no  wheezing/ronchi/rales.  Comfortable work of breathing. Good air movement. Cardiac: Regular rate and rhythm. Normal S1/S2.  No murmurs, rubs, nor gallops.   Extremities: No peripheral edema.  Strong peripheral pulses.  Mental Status: No depression, anxiety, nor agitation. Skin: Warm and dry.  Assessment & Plan: Kaliyan was seen today for f/u depression and anxiety.  Diagnoses and all orders for this visit:  Essential hypertension, benign Orders: -     hydrochlorothiazide (HYDRODIURIL) 25 MG tablet; Take 1 tablet (25 mg total) by mouth daily. -     potassium chloride SA (K-DUR,KLOR-CON) 20 MEQ tablet; Take 1 tablet (20 mEq total) by mouth daily with breakfast. -     metoprolol tartrate (LOPRESSOR) 25 MG tablet; Take 1 tablet (25 mg total) by mouth 2 (two) times daily.  Anxiety and depression Orders: -     citalopram (CELEXA) 20 MG tablet; Take 1 tablet (20 mg total) by mouth daily.   Essential hypertension: Controlled continue metoprolol and hydrochlorothiazide with potassium supplement Anxiety and depression: Depression appears controlled however uncontrolled anxiety seems to be causing her insomnia. Urged to increase dose to 20 mg of citalopram and follow-up in one month if insomnia is still persistent  Return in about 3 months (around 05/15/2015) for Mood and HTN.

## 2015-03-26 ENCOUNTER — Encounter: Payer: Self-pay | Admitting: Family Medicine

## 2015-03-26 ENCOUNTER — Ambulatory Visit (INDEPENDENT_AMBULATORY_CARE_PROVIDER_SITE_OTHER): Payer: BLUE CROSS/BLUE SHIELD | Admitting: Family Medicine

## 2015-03-26 VITALS — BP 121/79 | HR 80 | Temp 98.1°F | Wt 177.0 lb

## 2015-03-26 DIAGNOSIS — R300 Dysuria: Secondary | ICD-10-CM | POA: Diagnosis not present

## 2015-03-26 DIAGNOSIS — S39012A Strain of muscle, fascia and tendon of lower back, initial encounter: Secondary | ICD-10-CM

## 2015-03-26 DIAGNOSIS — S338XXA Sprain of other parts of lumbar spine and pelvis, initial encounter: Secondary | ICD-10-CM

## 2015-03-26 DIAGNOSIS — N3000 Acute cystitis without hematuria: Secondary | ICD-10-CM

## 2015-03-26 DIAGNOSIS — N390 Urinary tract infection, site not specified: Secondary | ICD-10-CM | POA: Insufficient documentation

## 2015-03-26 LAB — POCT URINALYSIS DIPSTICK
Bilirubin, UA: NEGATIVE
Glucose, UA: NEGATIVE
KETONES UA: NEGATIVE
Nitrite, UA: NEGATIVE
PH UA: 6
RBC UA: NEGATIVE
Urobilinogen, UA: 0.2

## 2015-03-26 MED ORDER — CEPHALEXIN 500 MG PO CAPS: 500.0000 mg | ORAL_CAPSULE | Freq: Two times a day (BID) | ORAL | Status: DC

## 2015-03-26 MED ORDER — NAPROXEN 500 MG PO TABS: 500.0000 mg | ORAL_TABLET | Freq: Two times a day (BID) | ORAL | Status: DC

## 2015-03-26 MED ORDER — CYCLOBENZAPRINE HCL 5 MG PO TABS: 5.0000 mg | ORAL_TABLET | Freq: Every evening | ORAL | Status: DC | PRN

## 2015-03-26 NOTE — Assessment & Plan Note (Signed)
Symptoms also consistent with left-sided lumbosacral strain. Treat with home exercise program, heating pad, naproxen and Flexeril.

## 2015-03-26 NOTE — Patient Instructions (Signed)
Thank you for coming in today. Take Keflex twice daily. Take naproxen twice daily for pain and Flexeril at bedtime for muscle spasms and pain. If your belly pain worsens, or you have high fever, bad vomiting, blood in your stool or black tarry stool go to the Emergency Room.   Urinary Tract Infection Urinary tract infections (UTIs) can develop anywhere along your urinary tract. Your urinary tract is your body's drainage system for removing wastes and extra water. Your urinary tract includes two kidneys, two ureters, a bladder, and a urethra. Your kidneys are a pair of bean-shaped organs. Each kidney is about the size of your fist. They are located below your ribs, one on each side of your spine. CAUSES Infections are caused by microbes, which are microscopic organisms, including fungi, viruses, and bacteria. These organisms are so small that they can only be seen through a microscope. Bacteria are the microbes that most commonly cause UTIs. SYMPTOMS  Symptoms of UTIs may vary by age and gender of the patient and by the location of the infection. Symptoms in young women typically include a frequent and intense urge to urinate and a painful, burning feeling in the bladder or urethra during urination. Older women and men are more likely to be tired, shaky, and weak and have muscle aches and abdominal pain. A fever may mean the infection is in your kidneys. Other symptoms of a kidney infection include pain in your back or sides below the ribs, nausea, and vomiting. DIAGNOSIS To diagnose a UTI, your caregiver will ask you about your symptoms. Your caregiver also will ask to provide a urine sample. The urine sample will be tested for bacteria and white blood cells. White blood cells are made by your body to help fight infection. TREATMENT  Typically, UTIs can be treated with medication. Because most UTIs are caused by a bacterial infection, they usually can be treated with the use of antibiotics. The choice  of antibiotic and length of treatment depend on your symptoms and the type of bacteria causing your infection. HOME CARE INSTRUCTIONS  If you were prescribed antibiotics, take them exactly as your caregiver instructs you. Finish the medication even if you feel better after you have only taken some of the medication.  Drink enough water and fluids to keep your urine clear or pale yellow.  Avoid caffeine, tea, and carbonated beverages. They tend to irritate your bladder.  Empty your bladder often. Avoid holding urine for long periods of time.  Empty your bladder before and after sexual intercourse.  After a bowel movement, women should cleanse from front to back. Use each tissue only once. SEEK MEDICAL CARE IF:   You have back pain.  You develop a fever.  Your symptoms do not begin to resolve within 3 days. SEEK IMMEDIATE MEDICAL CARE IF:   You have severe back pain or lower abdominal pain.  You develop chills.  You have nausea or vomiting.  You have continued burning or discomfort with urination. MAKE SURE YOU:   Understand these instructions.  Will watch your condition.  Will get help right away if you are not doing well or get worse. Document Released: 06/04/2005 Document Revised: 02/24/2012 Document Reviewed: 10/03/2011 [facility] Patient Information 2015 [place], [place]. This information is not intended to replace advice given to you by your health care provider. Make sure you discuss any questions you have with your health care provider.   Lumbosacral Strain Lumbosacral strain is a strain of any of the parts that  make up your lumbosacral vertebrae. Your lumbosacral vertebrae are the bones that make up the lower third of your backbone. Your lumbosacral vertebrae are held together by muscles and tough, fibrous tissue (ligaments).  CAUSES  A sudden blow to your back can cause lumbosacral strain. Also, anything that causes an excessive stretch of the muscles in the low back  can cause this strain. This is typically seen when people exert themselves strenuously, fall, lift heavy objects, bend, or crouch repeatedly. RISK FACTORS  Physically demanding work.  Participation in pushing or pulling sports or sports that require a sudden twist of the back (tennis, golf, baseball).  Weight lifting.  Excessive lower back curvature.  Forward-tilted pelvis.  Weak back or abdominal muscles or both.  Tight hamstrings. SIGNS AND SYMPTOMS  Lumbosacral strain may cause pain in the area of your injury or pain that moves (radiates) down your leg.  DIAGNOSIS Your health care provider can often diagnose lumbosacral strain through a physical exam. In some cases, you may need tests such as X-ray exams.  TREATMENT  Treatment for your lower back injury depends on many factors that your clinician will have to evaluate. However, most treatment will include the use of anti-inflammatory medicines. HOME CARE INSTRUCTIONS   Avoid hard physical activities (tennis, racquetball, waterskiing) if you are not in proper physical condition for it. This may aggravate or create problems.  If you have a back problem, avoid sports requiring sudden body movements. Swimming and walking are generally safer activities.  Maintain good posture.  Maintain a healthy weight.  For acute conditions, you may put ice on the injured area.  Put ice in a plastic bag.  Place a towel between your skin and the bag.  Leave the ice on for 20 minutes, 2-3 times a day.  When the low back starts healing, stretching and strengthening exercises may be recommended. SEEK MEDICAL CARE IF:  Your back pain is getting worse.  You experience severe back pain not relieved with medicines. SEEK IMMEDIATE MEDICAL CARE IF:   You have numbness, tingling, weakness, or problems with the use of your arms or legs.  There is a change in bowel or bladder control.  You have increasing pain in any area of the body, including  your belly (abdomen).  You notice shortness of breath, dizziness, or feel faint.  You feel sick to your stomach (nauseous), are throwing up (vomiting), or become sweaty.  You notice discoloration of your toes or legs, or your feet get very cold. MAKE SURE YOU:   Understand these instructions.  Will watch your condition.  Will get help right away if you are not doing well or get worse. Document Released: 06/04/2005 Document Revised: 08/30/2013 Document Reviewed: 04/13/2013 [facility] Patient Information 2015 [place], [place]. This information is not intended to replace advice given to you by your health care provider. Make sure you discuss any questions you have with your health care provider.

## 2015-03-26 NOTE — Progress Notes (Signed)
Brittney Young is a 44 y.o. female who presents to [facility]  today for UTI and back pain.  She notes a several day history of urinary frequency urgency and dysuria. This is associated with low back pain. The back pain is present in the left low back and radiates to the left thigh. No weakness or numbness bowel bladder dysfunction or difficulty walking. She's tried cranberry juice for her symptoms which helped a bit. Her symptoms are somewhat consistent with previous episodes of UTI. She denies any injury.  Past Medical History  Diagnosis Date  . Hypertension   . Depression   . Anxiety    Past Surgical History  Procedure Laterality Date  . Cesarean section     History  Substance Use Topics  . Smoking status: Never Smoker   . Smokeless tobacco: Not on file  . Alcohol Use: No   ROS as above Medications: Current Outpatient Prescriptions  Medication Sig Dispense Refill  . acyclovir (ZOVIRAX) 400 MG tablet take 1 tablet by mouth three times a day for 5 days for OUTBREAK 15 tablet 5  . citalopram (CELEXA) 20 MG tablet Take 1 tablet (20 mg total) by mouth daily. 30 tablet 4  . hydrochlorothiazide (HYDRODIURIL) 25 MG tablet Take 1 tablet (25 mg total) by mouth daily. 30 tablet 5  . metoprolol tartrate (LOPRESSOR) 25 MG tablet Take 1 tablet (25 mg total) by mouth 2 (two) times daily. 60 tablet 5  . potassium chloride SA (K-DUR,KLOR-CON) 20 MEQ tablet Take 1 tablet (20 mEq total) by mouth daily with breakfast. 30 tablet 5  . Vitamin D, Ergocalciferol, (DRISDOL) 50000 UNITS CAPS capsule Take 1 capsule (50,000 Units total) by mouth every 7 (seven) days. 12 capsule 1  . cephALEXin (KEFLEX) 500 MG capsule Take 1 capsule (500 mg total) by mouth 2 (two) times daily. 14 capsule 0  . cyclobenzaprine (FLEXERIL) 5 MG tablet Take 1 tablet (5 mg total) by mouth at bedtime as needed for muscle spasms. 20 tablet 0  . naproxen (NAPROSYN) 500 MG tablet Take 1 tablet  (500 mg total) by mouth 2 (two) times daily with a meal. 30 tablet 0   No current facility-administered medications for this visit.   No Known Allergies   Exam:  BP 121/79 mmHg  Pulse 80  Temp(Src) 98.1 F (36.7 C)  Wt 177 lb (80.287 kg) Gen: Well NAD HEENT: EOMI,  MMM Lungs: Normal work of breathing. CTABL Heart: RRR no MRG Abd: NABS, Soft. Nondistended, Nontender CVA angle tenderness to percussion Exts: Brisk capillary refill, warm and well perfused.  Lumbar spine: Tender palpation left SI joint. Normal back range of motion. Negative straight leg raise test and Faber test bilaterally. Lower extremity strength is intact throughout. Normal gait is intact  Results for orders placed or performed in visit on 03/26/15 (from the past 24 hour(s))  POCT Urinalysis Dipstick     Status: Abnormal   Collection Time: 03/26/15  3:39 PM  Result Value Ref Range   Color, UA dark color    Clarity, UA clear    Glucose, UA neg    Bilirubin, UA neg    Ketones, UA neg    Spec Grav, UA >=1.030    Blood, UA neg    pH, UA 6.0    Protein, UA trace    Urobilinogen, UA 0.2    Nitrite, UA neg    Leukocytes, UA small (1+) (A) Negative   No results found.  Please see individual assessment and plan sections.

## 2015-03-26 NOTE — Assessment & Plan Note (Signed)
UTI- treat with Keflex.

## 2015-04-02 ENCOUNTER — Encounter: Payer: Self-pay | Admitting: Family Medicine

## 2015-04-02 ENCOUNTER — Ambulatory Visit (INDEPENDENT_AMBULATORY_CARE_PROVIDER_SITE_OTHER): Payer: BLUE CROSS/BLUE SHIELD | Admitting: Family Medicine

## 2015-04-02 VITALS — BP 138/94 | HR 82 | Wt 183.0 lb

## 2015-04-02 DIAGNOSIS — M5442 Lumbago with sciatica, left side: Secondary | ICD-10-CM | POA: Diagnosis not present

## 2015-04-02 DIAGNOSIS — M961 Postlaminectomy syndrome, not elsewhere classified: Secondary | ICD-10-CM | POA: Insufficient documentation

## 2015-04-02 MED ORDER — PREDNISONE 10 MG (48) PO TBPK: ORAL_TABLET | Freq: Every day | ORAL | Status: DC

## 2015-04-02 MED ORDER — GABAPENTIN 300 MG PO CAPS
300.0000 mg | ORAL_CAPSULE | Freq: Three times a day (TID) | ORAL | Status: DC
Start: 2015-04-02 — End: 2015-04-11

## 2015-04-02 NOTE — Assessment & Plan Note (Addendum)
Lumbago with left-sided sciatica. Increased prednisone dose pack to 10 mg 12 day dose pack. Prescribed also gabapentin. Refer to physical therapy. If not better in a week or 2 will proceed with MRI. X-ray was normal and reviewed at [facility] emergency room. Establish problem worsens

## 2015-04-02 NOTE — Progress Notes (Signed)
Brittney Young is a 44 y.o. female who presents to [facility]  today for back pain. Patient has acute low back pain radiating to the left leg. She was seen on the 18th and thought to have a lumbosacral strain and was given anti-inflammatory's home exercise and muscle spasm medications. She went to the emergency room [facility] recently for the same problem when it worsened. X-rays were normal. She was given a short Dosepak of prednisone which has been mildly helpful. She notes pain radiating to her calf associated with a numbness and tingling. She denies any significant weakness. No bowel or bladder problems. She has been unable to work since the 15th because of the pain. She has FMLA paperwork to fill out as well. No fevers chills nausea vomiting or diarrhea.   Past Medical History  Diagnosis Date  . Hypertension   . Depression   . Anxiety    Past Surgical History  Procedure Laterality Date  . Cesarean section     History  Substance Use Topics  . Smoking status: Never Smoker   . Smokeless tobacco: Not on file  . Alcohol Use: No   ROS as above Medications: Current Outpatient Prescriptions  Medication Sig Dispense Refill  . acyclovir (ZOVIRAX) 400 MG tablet take 1 tablet by mouth three times a day for 5 days for OUTBREAK 15 tablet 5  . cephALEXin (KEFLEX) 500 MG capsule Take 1 capsule (500 mg total) by mouth 2 (two) times daily. 14 capsule 0  . citalopram (CELEXA) 20 MG tablet Take 1 tablet (20 mg total) by mouth daily. 30 tablet 4  . cyclobenzaprine (FLEXERIL) 5 MG tablet Take 1 tablet (5 mg total) by mouth at bedtime as needed for muscle spasms. (Patient taking differently: Take 10 mg by mouth at bedtime as needed for muscle spasms. ) 20 tablet 0  . hydrochlorothiazide (HYDRODIURIL) 25 MG tablet Take 1 tablet (25 mg total) by mouth daily. 30 tablet 5  . HYDROcodone-acetaminophen (NORCO/VICODIN) 5-325 MG per tablet Take 1 tablet by mouth every 6  (six) hours as needed for moderate pain.    . metoprolol tartrate (LOPRESSOR) 25 MG tablet Take 1 tablet (25 mg total) by mouth 2 (two) times daily. 60 tablet 5  . potassium chloride SA (K-DUR,KLOR-CON) 20 MEQ tablet Take 1 tablet (20 mEq total) by mouth daily with breakfast. 30 tablet 5  . Vitamin D, Ergocalciferol, (DRISDOL) 50000 UNITS CAPS capsule Take 1 capsule (50,000 Units total) by mouth every 7 (seven) days. 12 capsule 1  . gabapentin (NEURONTIN) 300 MG capsule Take 1 capsule (300 mg total) by mouth 3 (three) times daily. 90 capsule 3  . predniSONE (STERAPRED UNI-PAK 48 TAB) 10 MG (48) TBPK tablet Take by mouth daily. 48 tablet 0   No current facility-administered medications for this visit.   No Known Allergies   Exam:  BP 138/94 mmHg  Pulse 82  Wt 183 lb (83.008 kg) Gen: Well NAD HEENT: EOMI,  MMM Lungs: Normal work of breathing. CTABL Heart: RRR no MRG Abd: NABS, Soft. Nondistended, Nontender Exts: Brisk capillary refill, warm and well perfused.  Back: Nontender to midline. Tender palpation left SI. Normal lumbar range of motion pain with flexion Positive left-sided straight leg raise test negative Faber test bilaterally. Reflexes are equal and normal bilateral knees and ankles. Strength is intact. Mildly antalgic gait present.  No results found for this or any previous visit (from the past 24 hour(s)). No results found.  Please see individual assessment and plan sections.

## 2015-04-02 NOTE — Patient Instructions (Signed)
Thank you for coming in today. Go to physical therpay.  If not better we will set up a MRI.  Take the higher dose prednisone.  Take gabapentin.  Come back or go to the emergency room if you notice new weakness new numbness problems walking or bowel or bladder problems. Sciatica Sciatica is pain, weakness, numbness, or tingling along the path of the sciatic nerve. The nerve starts in the lower back and runs down the back of each leg. The nerve controls the muscles in the lower leg and in the back of the knee, while also providing sensation to the back of the thigh, lower leg, and the sole of your foot. Sciatica is a symptom of another medical condition. For instance, nerve damage or certain conditions, such as a herniated disk or bone spur on the spine, pinch or put pressure on the sciatic nerve. This causes the pain, weakness, or other sensations normally associated with sciatica. Generally, sciatica only affects one side of the body. CAUSES   Herniated or slipped disc.  Degenerative disk disease.  A pain disorder involving the narrow muscle in the buttocks (piriformis syndrome).  Pelvic injury or fracture.  Pregnancy.  Tumor (rare). SYMPTOMS  Symptoms can vary from mild to very severe. The symptoms usually travel from the low back to the buttocks and down the back of the leg. Symptoms can include:  Mild tingling or dull aches in the lower back, leg, or hip.  Numbness in the back of the calf or sole of the foot.  Burning sensations in the lower back, leg, or hip.  Sharp pains in the lower back, leg, or hip.  Leg weakness.  Severe back pain inhibiting movement. These symptoms may get worse with coughing, sneezing, laughing, or prolonged sitting or standing. Also, being overweight may worsen symptoms. DIAGNOSIS  Your caregiver will perform a physical exam to look for common symptoms of sciatica. He or she may ask you to do certain movements or activities that would trigger sciatic  nerve pain. Other tests may be performed to find the cause of the sciatica. These may include:  Blood tests.  X-rays.  Imaging tests, such as an MRI or CT scan. TREATMENT  Treatment is directed at the cause of the sciatic pain. Sometimes, treatment is not necessary and the pain and discomfort goes away on its own. If treatment is needed, your caregiver may suggest:  Over-the-counter medicines to relieve pain.  Prescription medicines, such as anti-inflammatory medicine, muscle relaxants, or narcotics.  Applying heat or ice to the painful area.  Steroid injections to lessen pain, irritation, and inflammation around the nerve.  Reducing activity during periods of pain.  Exercising and stretching to strengthen your abdomen and improve flexibility of your spine. Your caregiver may suggest losing weight if the extra weight makes the back pain worse.  Physical therapy.  Surgery to eliminate what is pressing or pinching the nerve, such as a bone spur or part of a herniated disk. HOME CARE INSTRUCTIONS   Only take over-the-counter or prescription medicines for pain or discomfort as directed by your caregiver.  Apply ice to the affected area for 20 minutes, 3-4 times a day for the first 48-72 hours. Then try heat in the same way.  Exercise, stretch, or perform your usual activities if these do not aggravate your pain.  Attend physical therapy sessions as directed by your caregiver.  Keep all follow-up appointments as directed by your caregiver.  Do not wear high heels or shoes that  do not provide proper support.  Check your mattress to see if it is too soft. A firm mattress may lessen your pain and discomfort. SEEK IMMEDIATE MEDICAL CARE IF:   You lose control of your bowel or bladder (incontinence).  You have increasing weakness in the lower back, pelvis, buttocks, or legs.  You have redness or swelling of your back.  You have a burning sensation when you urinate.  You have  pain that gets worse when you lie down or awakens you at night.  Your pain is worse than you have experienced in the past.  Your pain is lasting longer than 4 weeks.  You are suddenly losing weight without reason. MAKE SURE YOU:  Understand these instructions.  Will watch your condition.  Will get help right away if you are not doing well or get worse. Document Released: 08/19/2001 Document Revised: 02/24/2012 Document Reviewed: 01/04/2012 [facility] Patient Information 2015 [place], [place]. This information is not intended to replace advice given to you by your health care provider. Make sure you discuss any questions you have with your health care provider.

## 2015-04-04 ENCOUNTER — Ambulatory Visit (INDEPENDENT_AMBULATORY_CARE_PROVIDER_SITE_OTHER): Payer: BLUE CROSS/BLUE SHIELD | Admitting: Rehabilitative and Restorative Service Providers"

## 2015-04-04 ENCOUNTER — Encounter: Payer: Self-pay | Admitting: Rehabilitative and Restorative Service Providers"

## 2015-04-04 DIAGNOSIS — M5416 Radiculopathy, lumbar region: Secondary | ICD-10-CM

## 2015-04-04 DIAGNOSIS — Z7409 Other reduced mobility: Secondary | ICD-10-CM

## 2015-04-04 DIAGNOSIS — R52 Pain, unspecified: Secondary | ICD-10-CM

## 2015-04-04 DIAGNOSIS — M2560 Stiffness of unspecified joint, not elsewhere classified: Secondary | ICD-10-CM | POA: Diagnosis not present

## 2015-04-04 DIAGNOSIS — R531 Weakness: Secondary | ICD-10-CM
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
## 2015-04-04 NOTE — Patient Instructions (Signed)
Avoid sitting as much as possible  Sit with pillow with firm support in back and hand towel at waist  Lying down with pillow to support hips and legs all the way to ankles  Try lying on back with thin hand towel at waist with hips/knees at 90 degrees  Moist heat  Look for TENS unit for pain management  Try to avoid shifting weight to Rt side

## 2015-04-06 ENCOUNTER — Ambulatory Visit (INDEPENDENT_AMBULATORY_CARE_PROVIDER_SITE_OTHER): Payer: BLUE CROSS/BLUE SHIELD | Admitting: Rehabilitative and Restorative Service Providers"

## 2015-04-06 ENCOUNTER — Encounter: Payer: Self-pay | Admitting: Rehabilitative and Restorative Service Providers"

## 2015-04-06 DIAGNOSIS — R531 Weakness: Secondary | ICD-10-CM

## 2015-04-06 DIAGNOSIS — R52 Pain, unspecified: Secondary | ICD-10-CM

## 2015-04-06 DIAGNOSIS — M5416 Radiculopathy, lumbar region: Secondary | ICD-10-CM

## 2015-04-06 DIAGNOSIS — Z7409 Other reduced mobility: Secondary | ICD-10-CM

## 2015-04-06 DIAGNOSIS — M2560 Stiffness of unspecified joint, not elsewhere classified: Secondary | ICD-10-CM | POA: Diagnosis not present

## 2015-04-06 NOTE — Patient Instructions (Signed)
Abdominal Bracing With Pelvic Floor (Hook-Lying)   With neutral spine, tighten pelvic floor and abdominals, tighten back muscles at waist. Hold 10 sec Repeat _10__ times. Do _several __ times a day. Work toward doing this when sitting and standing - during functional activities.     Extremity Flexion (Hook-Lying)   Tighten stomach and slowly lower right arm over head until back begins to arch. Repeat with left arm Keep trunk rigid. Repeat _10___ times per set. Do __1-2__ sets per session. Do __2-3__ sessions per day.   Bent Leg Lift (Hook-Lying)   Tighten stomach and slowly raise right leg _6-8___ inches from floor. Repeat with Lt leg. Keep trunk rigid. Hold _1-2___ seconds. Repeat _10___ times per set. Do _1-2___ sets per session. Do _2-3___ sessions per day.  HIP: Hamstrings - Supine  Opposite knee bent!! Place strap around foot. Raise leg up, keep knee straight. Hold _30__ seconds. _3__ reps per set  _2-3__ days per week

## 2015-04-06 NOTE — Therapy (Signed)
[facility] [place], [place], [address] Phone: [phone]   Fax:  [phone]  Physical Therapy Treatment  Patient Details  Name: Brittney Young MRN: [id number] Date of Birth: 08-18-1971 Referring Provider:  Gregor Hams, MD  Encounter Date: 04/06/2015      PT End of Session - 04/06/15 1541    Visit Number 2   Number of Visits 16   Date for PT Re-Evaluation 05/30/15   PT Start Time 0230   PT Stop Time 0339   PT Time Calculation (min) 69 min   Activity Tolerance Patient tolerated treatment well;No increased pain      Past Medical History  Diagnosis Date  . Hypertension   . Depression   . Anxiety     Past Surgical History  Procedure Laterality Date  . Cesarean section      There were no vitals filed for this visit.  Visit Diagnosis:  Lumbar radiculopathy, acute  Pain  Stiffness in joint  Decreased strength, endurance, and mobility      Subjective Assessment - 04/06/15 1507    Subjective Brittney Young reports continued pain - may have been a little less yesterday but she has to be up with household chores and activities. Discussed that she may ask family for help - especially over the weekend.    Currently in Pain? Yes   Pain Score 9    Pain Location Back   Pain Orientation Right;Lower   Pain Descriptors / Indicators Stabbing;Burning;Shooting;Radiating;Sharp   Pain Type Acute pain   Pain Radiating Towards hip into front of leg through knee to jest below knee                         [facility] Adult PT Treatment/Exercise - 04/06/15 0001    Lumbar Exercises: Stretches   Passive Hamstring Stretch 30 seconds;2 reps   Lumbar Exercises: Supine   AB Set Limitations 3 part core 10 sec x10 reps   Glut Set Limitations painful    Bent Knee Raise 1 second;2 seconds;10 reps   Other Supine Lumbar Exercises alternate shoulder flexion 10 reps   Moist Heat Therapy   Number Minutes Moist  Heat 10 Minutes   Moist Heat Location Lumbar Spine;Hip;Knee  prior to treatment   Cryotherapy   Number Minutes Cryotherapy 15 Minutes   Cryotherapy Location Lumbar Spine   Type of Cryotherapy Ice pack   Electrical Stimulation   Electrical Stimulation Location L-spine Lt hip   Electrical Stimulation Action IFC   Electrical Stimulation Parameters to tolerance   Electrical Stimulation Goals Pain   Manual Therapy   Manual therapy comments unable to tolerate                PT Education - 04/06/15 1540    Education provided Yes   Education Details neutral spine; core stabilization; exercises; positioning   Person(s) Educated Patient   Methods Explanation;Demonstration;Tactile cues;Verbal cues;Handout   Comprehension Verbalized understanding;Returned demonstration;Verbal cues required;Tactile cues required          PT Short Term Goals - 04/06/15 1546    PT SHORT TERM GOAL #1   Title Patient to tolerate initial HEP - I in initial exercises for home 05/01/15   Time 4   Period Weeks   Status On-going   PT SHORT TERM GOAL #2   Title Patient to stand upright with good alignment and equal weight bearing bilat LE's 04/17/15   Time 2  Period Weeks   Status On-going   PT SHORT TERM GOAL #3   Title Complete strength testing LE's as symptoms allow 04/17/15   Time 2   Period Weeks   Status On-going   PT SHORT TERM GOAL #4   Title Patient to tolerate sitting for 20 min without increased symptoms; standing and walking 10 min without increased symptoms 05/01/15   Time 4   Period Weeks   Status On-going           PT Long Term Goals - 04/06/15 1546    PT LONG TERM GOAL #1   Title Patient I in HEP for discharge 05/30/15   Time 8   Period Weeks   Status On-going   PT LONG TERM GOAL #2   Title Trunk ROOM at ~ 50-75% of normal ROM 05/30/15   Time 8   Period Weeks   Status On-going   PT LONG TERM GOAL #3   Title Patient reports increased tolerance for ADL's and RTW  05/30/15   Time 8   Period Weeks   Status On-going   PT LONG TERM GOAL #4   Title Decrease FOTO to </=  % limitation 05/30/15   Time 8   Period Weeks   Status On-going               Plan - 04/06/15 1542    Clinical Impression Statement Dicsogenic signs and symptoms persist - related to activity level. Prone on pillows increaed pain but supine with hips and knees bent produced 50% reduction in pain. Tolerated core stabilization and gentle hamstring stretch with minimal difficulties. Good relief with ice/estim. Unable to tolerate manual work through [place].   Pt will benefit from skilled therapeutic intervention in order to improve on the following deficits Pain;Postural dysfunction;Improper body mechanics;Decreased range of motion;Abnormal gait;Decreased activity tolerance;Decreased endurance   Rehab Potential Good   PT Frequency 2x / week   PT Duration 8 weeks   PT Treatment/Interventions Patient/family education;ADLs/Self Care Home Management;Therapeutic exercise;Therapeutic activities;Neuromuscular re-education;Cryotherapy;Electrical Stimulation;Moist Heat;Iontophoresis 4mg /ml Dexamethasone;Ultrasound;Manual techniques   PT Next Visit Plan Continue spinal education; progress gradually with core stabilization; modalities; manual work as tolerated/indicated   PT Home Exercise Plan core stabilization HS gentle stretch   Consulted and Agree with Plan of Care Patient        Problem List Patient Active Problem List   Diagnosis Date Noted  . Lumbago 04/02/2015  . UTI (urinary tract infection) 03/26/2015  . Anxiety and depression 12/18/2014  . Anemia 07/03/2014  . Vitamin D deficiency 07/03/2014  . Other fatigue 07/03/2014  . Essential hypertension, benign 06/30/2014  . Genital herpes 06/30/2014    Celyn Nilda Simmer, PT, MPH  04/06/2015, 3:52 PM  [facility] [place], [place], [address] Phone: [phone]    Fax:  [phone]

## 2015-04-11 ENCOUNTER — Encounter: Payer: Self-pay | Admitting: Family Medicine

## 2015-04-11 ENCOUNTER — Ambulatory Visit (INDEPENDENT_AMBULATORY_CARE_PROVIDER_SITE_OTHER): Payer: BLUE CROSS/BLUE SHIELD | Admitting: Family Medicine

## 2015-04-11 ENCOUNTER — Encounter: Payer: Self-pay | Admitting: Rehabilitative and Restorative Service Providers"

## 2015-04-11 ENCOUNTER — Ambulatory Visit (INDEPENDENT_AMBULATORY_CARE_PROVIDER_SITE_OTHER): Payer: BLUE CROSS/BLUE SHIELD | Admitting: Rehabilitative and Restorative Service Providers"

## 2015-04-11 VITALS — BP 136/94 | HR 110 | Wt 187.0 lb

## 2015-04-11 DIAGNOSIS — R52 Pain, unspecified: Secondary | ICD-10-CM | POA: Diagnosis not present

## 2015-04-11 DIAGNOSIS — R6889 Other general symptoms and signs: Secondary | ICD-10-CM

## 2015-04-11 DIAGNOSIS — M5442 Lumbago with sciatica, left side: Secondary | ICD-10-CM

## 2015-04-11 DIAGNOSIS — Z7409 Other reduced mobility: Secondary | ICD-10-CM | POA: Diagnosis not present

## 2015-04-11 DIAGNOSIS — M5416 Radiculopathy, lumbar region: Secondary | ICD-10-CM

## 2015-04-11 DIAGNOSIS — R531 Weakness: Secondary | ICD-10-CM

## 2015-04-11 DIAGNOSIS — M2560 Stiffness of unspecified joint, not elsewhere classified: Secondary | ICD-10-CM | POA: Diagnosis not present

## 2015-04-11 MED ORDER — GABAPENTIN 600 MG PO TABS: 600.0000 mg | ORAL_TABLET | Freq: Three times a day (TID) | ORAL | Status: DC
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
## 2015-04-11 NOTE — Patient Instructions (Signed)
Thank you for coming in today. Get MRI. Call me if you dont hear about scheduling.  Return a few days after the MRI is done.  Come back or go to the emergency room if you notice new weakness new numbness problems walking or bowel or bladder problems.  Increase gabapentin to 600 three times daily.

## 2015-04-11 NOTE — Assessment & Plan Note (Signed)
Not better with PT and gabapentin.  Will increase gabapentin to 600 tid and hold PT.  Will obtain MRI.  F/u after MRI obtained.

## 2015-04-11 NOTE — Progress Notes (Signed)
Brittney Young is a 44 y.o. female who presents to [facility]  today for continued left back pain. Patient notes continued severe left back pain with pain radiating to the lower leg. She additionally has numbness into her lower leg and subjective left-sided weakness. She denies any bowel bladder dysfunction fevers chills nausea vomiting or diarrhea. She has nearly completed her prednisone dosepack which has not helped much. She has tried gabapentin and is currently taking 300 mg 3 times a day. She is increased to 600 mg at night which seems to help some. She is not able to work because the pain is interferes with her ability to function at work.   Past Medical History  Diagnosis Date  . Hypertension   . Depression   . Anxiety    Past Surgical History  Procedure Laterality Date  . Cesarean section     History  Substance Use Topics  . Smoking status: Never Smoker   . Smokeless tobacco: Not on file  . Alcohol Use: No   ROS as above Medications: Current Outpatient Prescriptions  Medication Sig Dispense Refill  . acyclovir (ZOVIRAX) 400 MG tablet take 1 tablet by mouth three times a day for 5 days for OUTBREAK 15 tablet 5  . cephALEXin (KEFLEX) 500 MG capsule Take 1 capsule (500 mg total) by mouth 2 (two) times daily. 14 capsule 0  . citalopram (CELEXA) 20 MG tablet Take 1 tablet (20 mg total) by mouth daily. 30 tablet 4  . cyclobenzaprine (FLEXERIL) 5 MG tablet Take 1 tablet (5 mg total) by mouth at bedtime as needed for muscle spasms. (Patient taking differently: Take 10 mg by mouth at bedtime as needed for muscle spasms. ) 20 tablet 0  . hydrochlorothiazide (HYDRODIURIL) 25 MG tablet Take 1 tablet (25 mg total) by mouth daily. 30 tablet 5  . HYDROcodone-acetaminophen (NORCO/VICODIN) 5-325 MG per tablet Take 1 tablet by mouth every 6 (six) hours as needed for moderate pain.    . metoprolol tartrate (LOPRESSOR) 25 MG tablet Take 1 tablet (25 mg  total) by mouth 2 (two) times daily. 60 tablet 5  . potassium chloride SA (K-DUR,KLOR-CON) 20 MEQ tablet Take 1 tablet (20 mEq total) by mouth daily with breakfast. 30 tablet 5  . predniSONE (STERAPRED UNI-PAK 48 TAB) 10 MG (48) TBPK tablet Take by mouth daily. 48 tablet 0  . Vitamin D, Ergocalciferol, (DRISDOL) 50000 UNITS CAPS capsule Take 1 capsule (50,000 Units total) by mouth every 7 (seven) days. 12 capsule 1  . gabapentin (NEURONTIN) 600 MG tablet Take 1 tablet (600 mg total) by mouth 3 (three) times daily. 90 tablet 1   No current facility-administered medications for this visit.   No Known Allergies   Exam:  BP 136/94 mmHg  Pulse 110  Wt 187 lb (84.823 kg) Gen: Well NAD HEENT: EOMI,  MMM Lungs: Normal work of breathing. CTABL Heart: Mild tachycardia no MRG Abd: NABS, Soft. Nondistended, Nontender Exts: Brisk capillary refill, warm and well perfused.  Back: Nontender to midline. Tender palpation left lumbar paraspinals. Range of motion intact but painful. Reflexes intact bilateral lower extremities. Strength is intact to hip flexion and knee flexion and extension ankle dorsiflexion plantar flexion and great toe dorsiflexion plantarflexion. Sensation decreased the lateral aspect of her lower leg. Antalgic gait present  No results found for this or any previous visit (from the past 24 hour(s)). No results found.   Please see individual assessment and plan sections.

## 2015-04-12 ENCOUNTER — Encounter: Payer: BLUE CROSS/BLUE SHIELD | Admitting: Physical Therapy

## 2015-04-12 ENCOUNTER — Telehealth: Payer: Self-pay

## 2015-04-12 NOTE — Telephone Encounter (Signed)
Pt called stating that [facility] has faxed [place] another form and they need office note from her visit on 04/11/2015. Faxed notes to [phone]. Pt notified.

## 2015-04-16 ENCOUNTER — Ambulatory Visit (INDEPENDENT_AMBULATORY_CARE_PROVIDER_SITE_OTHER): Payer: BLUE CROSS/BLUE SHIELD

## 2015-04-16 DIAGNOSIS — M5442 Lumbago with sciatica, left side: Secondary | ICD-10-CM

## 2015-04-16 DIAGNOSIS — M5126 Other intervertebral disc displacement, lumbar region: Secondary | ICD-10-CM | POA: Diagnosis not present

## 2015-04-17 ENCOUNTER — Telehealth: Payer: Self-pay

## 2015-04-17 ENCOUNTER — Telehealth: Payer: Self-pay | Admitting: Family Medicine

## 2015-04-17 DIAGNOSIS — M5432 Sciatica, left side: Secondary | ICD-10-CM

## 2015-04-17 NOTE — Telephone Encounter (Signed)
Lumbar MRI shows a bulging disc that makes sense for the pain. Epidural steroid injection ordered. CMA will contact patient

## 2015-04-17 NOTE — Telephone Encounter (Signed)
Pt notified of results and recommendations.

## 2015-04-17 NOTE — Telephone Encounter (Signed)
Pt called requesting that I fax MRI results and recommendations to [employer] at [phone]. Fax sent.

## 2015-04-18 ENCOUNTER — Encounter: Payer: BLUE CROSS/BLUE SHIELD | Admitting: Rehabilitative and Restorative Service Providers"

## 2015-04-19 ENCOUNTER — Encounter: Payer: BLUE CROSS/BLUE SHIELD | Admitting: Rehabilitative and Restorative Service Providers"

## 2015-04-19 ENCOUNTER — Other Ambulatory Visit: Payer: Self-pay | Admitting: Family Medicine

## 2015-04-19 MED ORDER — CYCLOBENZAPRINE HCL 10 MG PO TABS: 10.0000 mg | ORAL_TABLET | Freq: Three times a day (TID) | ORAL | Status: DC | PRN

## 2015-04-19 NOTE — Telephone Encounter (Signed)
Pt states when she was at the ED she was advised to take 10mg  of Flexeril at night, so that is how she's been taking the Rx. Not taking 5mg  as written. Will route to Provider for review.

## 2015-04-25 ENCOUNTER — Other Ambulatory Visit: Payer: Self-pay | Admitting: Family Medicine

## 2015-04-26 ENCOUNTER — Ambulatory Visit
Admission: RE | Admit: 2015-04-26 | Discharge: 2015-04-26 | Disposition: A | Discharge status: Home / Self Care | Payer: BLUE CROSS/BLUE SHIELD | Source: Ambulatory Visit | Attending: Family Medicine | Admitting: Family Medicine

## 2015-04-26 MED ORDER — METHYLPREDNISOLONE ACETATE 40 MG/ML INJ SUSP (RADIOLOG
120.0000 mg | Freq: Once | INTRAMUSCULAR | Status: AC
  Administered 2015-04-26: 120 mg via EPIDURAL

## 2015-04-26 MED ORDER — IOHEXOL 180 MG/ML  SOLN
1.0000 mL | Freq: Once | INTRAMUSCULAR | Status: DC | PRN
Start: 2015-04-26 — End: 2015-04-27
  Administered 2015-04-26: 1 mL via EPIDURAL

## 2015-04-26 NOTE — Telephone Encounter (Signed)
Quick Note:  Please contact pt to see how she is doing ______

## 2015-04-26 NOTE — Discharge Instructions (Signed)

## 2015-05-17 ENCOUNTER — Telehealth: Payer: Self-pay

## 2015-05-17 ENCOUNTER — Ambulatory Visit: Payer: BLUE CROSS/BLUE SHIELD | Admitting: Osteopathic Medicine

## 2015-05-17 NOTE — Telephone Encounter (Signed)
Patient called to find out if Dr Georgina Snell has received disability paperwork.

## 2015-05-17 NOTE — Telephone Encounter (Signed)
Left vm notifying pt that we did receive the paper work.

## 2015-05-18 ENCOUNTER — Ambulatory Visit (INDEPENDENT_AMBULATORY_CARE_PROVIDER_SITE_OTHER): Payer: BLUE CROSS/BLUE SHIELD | Admitting: Family Medicine

## 2015-05-18 ENCOUNTER — Encounter: Payer: Self-pay | Admitting: Family Medicine

## 2015-05-18 VITALS — BP 148/95 | HR 106 | Wt 188.0 lb

## 2015-05-18 DIAGNOSIS — M5442 Lumbago with sciatica, left side: Secondary | ICD-10-CM | POA: Diagnosis not present

## 2015-05-18 DIAGNOSIS — R29898 Other symptoms and signs involving the musculoskeletal system: Secondary | ICD-10-CM | POA: Diagnosis not present

## 2015-05-18 NOTE — Assessment & Plan Note (Addendum)
Patient has left-sided low back pain with radicular component. She's artery had one epidural steroid injection. The epidural steroid injection significantly improved her pain radiating to her leg however she continues to experience some weakness especially in hip extension which makes sense based on the L3 disc herniation. Plan to refer back to physical therapy for further evaluation and management. Will consider retrial of epidural steroid injection. Additionally she is a week we'll continue to follow weakness. If this worsens will promptly referred to neurosurgery.  Establish problem slightly worsened

## 2015-05-18 NOTE — Patient Instructions (Signed)
Thank you for coming in today. Continue ibuprofen. Attend physical therapy. Return in a few weeks. Come back or go to the emergency room if you notice new weakness new numbness problems walking or bowel or bladder problems.

## 2015-05-18 NOTE — Progress Notes (Signed)
Brittney Young is a 44 y.o. female who presents to [facility]  today for follow-up back pain. Patient has been seen several times for left sided low back pain radiating to the left leg. She had an epidural steroid injection on August 18. The Septra radiating pain considerably. However she continues to experience left-sided low back pain. Additionally she notes weakness especially when climbing stairs in her left leg. She denies any numbness bowel bladder dysfunction or difficulty walking. The pain is debilitating and currently interfering with her ability to work.   Past Medical History  Diagnosis Date  . Hypertension   . Depression   . Anxiety    Past Surgical History  Procedure Laterality Date  . Cesarean section     Social History  Substance Use Topics  . Smoking status: Never Smoker   . Smokeless tobacco: Not on file  . Alcohol Use: No   family history includes Cancer in her sister; Stroke in her mother.  ROS as above Medications: Current Outpatient Prescriptions  Medication Sig Dispense Refill  . acyclovir (ZOVIRAX) 400 MG tablet take 1 tablet by mouth three times a day for 5 days for OUTBREAK 15 tablet 5  . citalopram (CELEXA) 20 MG tablet Take 1 tablet (20 mg total) by mouth daily. 30 tablet 4  . cyclobenzaprine (FLEXERIL) 10 MG tablet Take 1 tablet (10 mg total) by mouth 3 (three) times daily as needed for muscle spasms. 90 tablet 1  . gabapentin (NEURONTIN) 600 MG tablet Take 1 tablet (600 mg total) by mouth 3 (three) times daily. 90 tablet 1  . hydrochlorothiazide (HYDRODIURIL) 25 MG tablet Take 1 tablet (25 mg total) by mouth daily. 30 tablet 5  . HYDROcodone-acetaminophen (NORCO/VICODIN) 5-325 MG per tablet Take 1 tablet by mouth every 6 (six) hours as needed for moderate pain.    . metoprolol tartrate (LOPRESSOR) 25 MG tablet Take 1 tablet (25 mg total) by mouth 2 (two) times daily. 60 tablet 5  . potassium chloride SA  (K-DUR,KLOR-CON) 20 MEQ tablet Take 1 tablet (20 mEq total) by mouth daily with breakfast. 30 tablet 5  . Vitamin D, Ergocalciferol, (DRISDOL) 50000 UNITS CAPS capsule Take 1 capsule (50,000 Units total) by mouth every 7 (seven) days. 12 capsule 1   No current facility-administered medications for this visit.   No Known Allergies   Exam:  BP 148/95 mmHg  Pulse 106  Wt 188 lb (85.276 kg)  LMP 03/31/2015 Gen: Well NAD HEENT: EOMI,  MMM Lungs: Normal work of breathing. CTABL Heart: RRR no MRG heart rate per my check 98 bpm Abd: NABS, Soft. Nondistended, Nontender Exts: Brisk capillary refill, warm and well perfused.  Back: Nontender to spinal midline. Tender palpation left SI joint. Negative straight leg raise test and Faber test bilaterally. Positive left side is pretzel stretch. Lower extremity strength is significant for weakness in the left hip extensors especially when stepping up.   She is intact and equal hip flexion strength bilaterally hip abduction and adduction knee flexion and extension and ankle dorsiflexion and extension bilaterally. Reflexes show normal brisk reflexes in the right leg slightly diminished left knee reflex but normal left ankle reflex. Sensation is intact throughout. Normal gait.  MRI L-spine reviewed No results found for this or any previous visit (from the past 24 hour(s)). No results found.   Please see individual assessment and plan sections.

## 2015-05-21 ENCOUNTER — Other Ambulatory Visit: Payer: Self-pay | Admitting: Physician Assistant

## 2015-05-22 ENCOUNTER — Encounter: Payer: Self-pay | Admitting: Family Medicine

## 2015-05-23 ENCOUNTER — Ambulatory Visit (INDEPENDENT_AMBULATORY_CARE_PROVIDER_SITE_OTHER): Payer: BLUE CROSS/BLUE SHIELD | Admitting: Physical Therapy

## 2015-05-23 ENCOUNTER — Encounter: Payer: Self-pay | Admitting: Physical Therapy

## 2015-05-23 DIAGNOSIS — Z7409 Other reduced mobility: Secondary | ICD-10-CM | POA: Diagnosis not present

## 2015-05-23 DIAGNOSIS — M2560 Stiffness of unspecified joint, not elsewhere classified: Secondary | ICD-10-CM | POA: Diagnosis not present

## 2015-05-23 DIAGNOSIS — R6889 Other general symptoms and signs: Secondary | ICD-10-CM

## 2015-05-23 DIAGNOSIS — R52 Pain, unspecified: Secondary | ICD-10-CM | POA: Diagnosis not present

## 2015-05-23 DIAGNOSIS — R531 Weakness: Secondary | ICD-10-CM

## 2015-05-23 NOTE — Patient Instructions (Addendum)
Knee Roll   Lying on back, with knees bent and feet flat on floor, arms outstretched to sides, slowly roll both knees to side, hold 1-2 seconds. Back to starting position, hold 1-2 seconds. Then to opposite side, hold 1-2 seconds. Return to starting position. Keep shoulders and arms in contact with floor.  Pelvic Tilt: Posterior - Legs Bent (Supine)   Draw in lower stomach, pull up pelvic floor and tighten low back. Hold _5___ seconds. Relax. Repeat __10__ times per set. Do __1__ sets per session. Do __2__ sessions per day.   Outer Hip Stretch: Reclined IT Band Stretch (Strap)   Strap around opposite foot, pull across only as far as possible with shoulders on mat. Hold for _10___ breaths. Repeat _1___ times each leg.  Copyright  VHI. All rights reserved.  [place] [phone]

## 2015-05-23 NOTE — Therapy (Signed)
Brittney Young, [place], [address] Phone: [phone]   Fax:  [phone]  Physical Therapy Evaluation  Patient Details  Name: Brittney Young MRN: [id number] Date of Birth: [birth year] Referring Provider:  Gregor Hams, MD  Encounter Date: 05/23/2015      PT End of Session - 05/23/15 1543    Visit Number 1   Date for PT Re-Evaluation 05/30/15   PT Start Time 1502   PT Stop Time 1601   PT Time Calculation (min) 59 min      Past Medical History  Diagnosis Date  . Hypertension   . Depression   . Anxiety     Past Surgical History  Procedure Laterality Date  . Cesarean section      There were no vitals filed for this visit.  Visit Diagnosis:  Pain - Plan: PT plan of care cert/re-cert  Decreased strength, endurance, and mobility - Plan: PT plan of care cert/re-cert  Stiffness in joint - Plan: PT plan of care cert/re-cert      Subjective Assessment - 05/23/15 1505    Subjective Brittney Young had an MRI that showed a bulging disc since she was here last, she had an epidural injection about a month ago and this settled the pain down over 90%. No more pain into the Lt LE, now only has pain in the low back and left leg weakness.    How long can you sit comfortably? not limited   How long can you stand comfortably? not limited   How long can you walk comfortably? not limited   Diagnostic tests MRI    Patient Stated Goals increase strength in Lt LE, alternate legs on steps, squat and return to stand.    Currently in Pain? Yes   Pain Score 5    Pain Location Back   Pain Orientation Left;Lower   Pain Descriptors / Indicators Aching;Burning;Sharp;Throbbing;Dull   Pain Type Acute pain   Pain Onset More than a month ago   Pain Frequency Intermittent   Aggravating Factors  nothing now.    Pain Relieving Factors ibuprofen            [facility] PT Assessment - 05/23/15 0001    Assessment   Medical  Diagnosis Lt sided low back pain with sciatica   Onset Date/Surgical Date 03/26/15   Hand Dominance Right   Next MD Visit 06/15/15   Prior Therapy yes in Aug.   Precautions   Precautions None   Balance Screen   Has the patient fallen in the past 6 months No   Has the patient had a decrease in activity level because of a fear of falling?  No   Is the patient reluctant to leave their home because of a fear of falling?  No   Home Environment   Living Environment Private residence   Living Arrangements Spouse/significant other   Prior Function   Level of Independence Independent   Vocation Full time employment;On disability  short term for now,  goal return to work 07/10/15   Vocation Requirements [employer] - lifting 5-10 lb every 20 min; bending to lift objects in the center to place to the Rt; pushing and pulling carts with rolls of paper 50-75lb; standing on concrete floors; walking; 8 hr/5+ days/wk - 23 months   Leisure child care; household chores - soft couch at the end of the day   Observation/Other Assessments   Focus on Therapeutic Outcomes (FOTO)  43% limited   Sensation   Additional Comments no more numbness in Lt LE   Posture/Postural Control   Posture/Postural Control Postural limitations   Postural Limitations Rounded Shoulders;Forward head;Increased lumbar lordosis   ROM / Strength   AROM / PROM / Strength Strength   AROM   Lumbar Flexion to lower shin , pain in one spot L t  SIJ area   Lumbar Extension decreased 50%    Lumbar - Right Side Bend WNL   Lumbar - Left Side Bend WNL   Lumbar - Right Rotation WNL   Lumbar - Left Rotation WNL   Strength   Overall Strength Deficits   Overall Strength Comments multifidis fair (-),    Strength Assessment Site Hip;Knee;Ankle   Right/Left Hip Left  Rt WNL except abduction 4+/5   Left Hip Flexion 4+/5   Left Hip Extension 3+/5   Left Hip ABduction 3+/5   Right/Left Knee Left  Rt WNL   Left Knee  Flexion 4/5   Left Knee Extension 4+/5   Right/Left Ankle Left  Rt WNL   Left Ankle Dorsiflexion 4+/5   Left Ankle Plantar Flexion 4/5   Left Ankle Inversion 4+/5   Left Ankle Eversion 4+/5   Flexibility   Soft Tissue Assessment /Muscle Length --  LE'sWNL   Palpation   Palpation comment very tight in Lt upper gluts, piriformis, lumbar paraspinals.                    [facility] Adult PT Treatment/Exercise - 05/23/15 0001    Lumbar Exercises: Stretches   Lower Trunk Rotation --  10 reps to each side   ITB Stretch --  cross body stretch with strap for 10 breathes.    Lumbar Exercises: Supine   AB Set Limitations 3 part core 5 sec x10 reps   Modalities   Modalities Electrical Stimulation;Moist Heat   Moist Heat Therapy   Number Minutes Moist Heat 15 Minutes   Moist Heat Location Lumbar Spine  into Lt buttock   Electrical Stimulation   Electrical Stimulation Location Lt lumbar and buttock   Electrical Stimulation Action IFC   Electrical Stimulation Parameters to tolerance   Electrical Stimulation Goals Pain;Tone   Manual Therapy   Manual Therapy Joint mobilization;Myofascial release   Joint Mobilization grade II- III Lt UPA mobs L4-5 on articular pillar and transverse process   Myofascial Release to Lt piriformis, gluts and lumbar paraspinals                 PT Education - 05/23/15 1537    Education provided Yes   Education Details HEP    Person(s) Educated Patient   Methods Explanation;Handout   Comprehension Returned demonstration;Verbal cues required          PT Short Term Goals - 05/23/15 1703    PT SHORT TERM GOAL #1   Title I with initial HEP ( 06/13/15)    Time 3   Period Weeks   Status New   PT SHORT TERM GOAL #2   Title Patient to stand upright with good alignment and equal weight bearing bilat LE's 06/13/15)   Time 3   Period Weeks   Status New   PT SHORT TERM GOAL #3   Title increase Lt LE strength =/> 4/5 through out ( 06/13/15)     Time 3   Period Weeks   Status New           PT Long Term Goals - 05/23/15  [place] #1   Title I with advanced HEP ( 07/04/15)    Time 6   Period Weeks   Status New   PT LONG TERM GOAL #2   Title report pain reduction =/> 75% with daily activity ( 07/04/15)    Time 6   Period Weeks   Status New   PT LONG TERM GOAL #3   Title demo Lt LE strength =/> 5-/5 ( 07/04/15)    Time 6   Period Weeks   Status New   PT LONG TERM GOAL #4   Title Decrease FOTO to </=  33% limitation 07/04/15   Time 6   Period Weeks   Status New   PT LONG TERM GOAL #5   Title lift up to 50# floor to waist without increased pain to assist with return to work ( 07/04/15)    Time 6   Period Weeks   Status New   Additional Long Term Goals   Additional Long Term Goals Yes   PT LONG TERM GOAL #6   Title push/pull =/> 100# on the sled without increased pain to assist with return to work ( 07/04/15)    Time 6   Period Weeks   Status New               Plan - 05/23/15 1701    Clinical Impression Statement 44 yo female referred back to PT after having spinal injections, she no longer has pain down her Lt LE however does have pain in the Lt low back and buttocks along with Lt LE weakness.     Pt will benefit from skilled therapeutic intervention in order to improve on the following deficits Pain;Postural dysfunction;Improper body mechanics;Decreased range of motion;Abnormal gait;Decreased activity tolerance;Decreased endurance;Decreased strength   Rehab Potential Good   PT Frequency 2x / week   PT Duration 6 weeks   PT Treatment/Interventions Patient/family education;ADLs/Self Care Home Management;Therapeutic exercise;Therapeutic activities;Neuromuscular re-education;Cryotherapy;Electrical Stimulation;Moist Heat;Iontophoresis 4mg /ml Dexamethasone;Ultrasound;Manual techniques;Traction;Dry needling   PT Next Visit Plan TPR/STW to Lt buttocks, progress core stability, possibly traction    Consulted and Agree with Plan of Care Patient         Problem List Patient Active Problem List   Diagnosis Date Noted  . Weakness of left hip 05/18/2015  . Lumbago 04/02/2015  . UTI (urinary tract infection) 03/26/2015  . Anxiety and depression 12/18/2014  . Anemia 07/03/2014  . Vitamin D deficiency 07/03/2014  . Other fatigue 07/03/2014  . Essential hypertension, benign 06/30/2014  . Genital herpes 06/30/2014    Jeral Pinch PT 05/23/2015, 5:09 PM  [facility] [address], [place], [address] Phone: [phone]   Fax:  [phone]

## 2015-05-25 ENCOUNTER — Ambulatory Visit (INDEPENDENT_AMBULATORY_CARE_PROVIDER_SITE_OTHER): Payer: BLUE CROSS/BLUE SHIELD | Admitting: Physical Therapy

## 2015-05-25 DIAGNOSIS — M2560 Stiffness of unspecified joint, not elsewhere classified: Secondary | ICD-10-CM | POA: Diagnosis not present

## 2015-05-25 DIAGNOSIS — Z7409 Other reduced mobility: Secondary | ICD-10-CM

## 2015-05-25 DIAGNOSIS — R52 Pain, unspecified: Secondary | ICD-10-CM

## 2015-05-25 DIAGNOSIS — M5416 Radiculopathy, lumbar region: Secondary | ICD-10-CM

## 2015-05-25 DIAGNOSIS — R531 Weakness: Secondary | ICD-10-CM

## 2015-05-25 NOTE — Therapy (Signed)
[place], [place], [address] Phone: [phone]   Fax:  [phone]  Physical Therapy Treatment  Patient Details  Name: Brittney Young MRN: [id number] Date of Birth: Aug 12, 1971 Referring Provider:  Gregor Hams, MD  Encounter Date: 05/25/2015      PT End of Session - 05/25/15 1336    Visit Number 2   Number of Visits 16   Date for PT Re-Evaluation 05/30/15   PT Start Time 3159   PT Stop Time 1418   PT Time Calculation (min) 45 min      Past Medical History  Diagnosis Date  . Hypertension   . Depression   . Anxiety     Past Surgical History  Procedure Laterality Date  . Cesarean section      There were no vitals filed for this visit.  Visit Diagnosis:  Pain  Decreased strength, endurance, and mobility  Stiffness in joint  Lumbar radiculopathy, acute      Subjective Assessment - 05/25/15 1337    Subjective Pt reports she has been performing her HEP since last visit. Still having trouble with transitional movements.  Pelvic tilts made her stomach cramp.    Currently in Pain? Yes   Pain Score 6    Pain Location Back   Pain Orientation Left;Lower   Pain Descriptors / Indicators Sharp   Aggravating Factors  first stand up, sitting for prolonged period   Pain Relieving Factors TENS, heat, medicine            [facility] PT Assessment - 05/25/15 0001    Assessment   Medical Diagnosis Lt sided low back pain with sciatica   Onset Date/Surgical Date 03/26/15   Hand Dominance Right   Next MD Visit 06/15/15          [facility] Adult PT Treatment/Exercise - 05/25/15 0001    Lumbar Exercises: Stretches   Passive Hamstring Stretch 2 reps;30 seconds   Passive Hamstring Stretch Limitations with strap    Lower Trunk Rotation --  10 reps to each side   Pelvic Tilt --  5 sec, 10 reps   ITB Stretch 2 reps;30 seconds  each leg; with strap   Lumbar Exercises: Seated   Sit to Stand  5 reps  with core engagement   Sit to Stand Limitations improvement with repetition   Modalities   Modalities Electrical Stimulation;Moist Heat   Moist Heat Therapy   Number Minutes Moist Heat 15 Minutes   Moist Heat Location Lumbar Spine  into Lt buttock   Electrical Stimulation   Electrical Stimulation Location bilat low back and SI joint    Electrical Stimulation Action IFC   Electrical Stimulation Parameters to tolerance    Electrical Stimulation Goals Pain   Manual Therapy   Manual Therapy Myofascial release   Myofascial Release to Lt piriformis, gluts and lumbar paraspinals                 PT Education - 05/25/15 1648    Education provided Yes   Education Details Core engagement with transitional movements and reviewed body mechanics for supine to/from sit.    Person(s) Educated Patient   Methods Explanation;Demonstration   Comprehension Verbalized understanding;Returned demonstration          PT Short Term Goals - 05/23/15 1703    PT SHORT TERM GOAL #1   Title I with initial HEP ( 06/13/15)    Time 3   Period Weeks  Status New   PT SHORT TERM GOAL #2   Title Patient to stand upright with good alignment and equal weight bearing bilat LE's 06/13/15)   Time 3   Period Weeks   Status New   PT SHORT TERM GOAL #3   Title increase Lt LE strength =/> 4/5 through out ( 06/13/15)    Time 3   Period Weeks   Status New           PT Long Term Goals - 05/23/15 1704    PT LONG TERM GOAL #1   Title I with advanced HEP ( 07/04/15)    Time 6   Period Weeks   Status New   PT LONG TERM GOAL #2   Title report pain reduction =/> 75% with daily activity ( 07/04/15)    Time 6   Period Weeks   Status New   PT LONG TERM GOAL #3   Title demo Lt LE strength =/> 5-/5 ( 07/04/15)    Time 6   Period Weeks   Status New   PT LONG TERM GOAL #4   Title Decrease FOTO to </=  33% limitation 07/04/15   Time 6   Period Weeks   Status New   PT LONG TERM GOAL #5    Title lift up to 50# floor to waist without increased pain to assist with return to work ( 07/04/15)    Time 6   Period Weeks   Status New   Additional Long Term Goals   Additional Long Term Goals Yes   PT LONG TERM GOAL #6   Title push/pull =/> 100# on the sled without increased pain to assist with return to work ( 07/04/15)    Time 6   Period Weeks   Status New               Plan - 05/25/15 1646    Clinical Impression Statement Pt required tactile/VC and demonstration to assist in proper form for pelvic tilt and transverse abdominal engagement.  Pt tolerated all new exercises without increase in pain. Pt reported reduction of pain in LB with use of estim and MHP at end of session.  No goals met yet; only second visit.    Pt will benefit from skilled therapeutic intervention in order to improve on the following deficits Pain;Postural dysfunction;Improper body mechanics;Decreased range of motion;Abnormal gait;Decreased activity tolerance;Decreased endurance;Decreased strength   Rehab Potential Good   PT Frequency 2x / week   PT Duration 6 weeks   PT Treatment/Interventions Patient/family education;ADLs/Self Care Home Management;Therapeutic exercise;Therapeutic activities;Neuromuscular re-education;Cryotherapy;Electrical Stimulation;Moist Heat;Iontophoresis 4mg/ml Dexamethasone;Ultrasound;Manual techniques;Traction;Dry needling   PT Next Visit Plan TPR/STW to Lt buttocks, progress core stability, possibly traction   Consulted and Agree with Plan of Care Patient        Problem List Patient Active Problem List   Diagnosis Date Noted  . Weakness of left hip 05/18/2015  . Lumbago 04/02/2015  . UTI (urinary tract infection) 03/26/2015  . Anxiety and depression 12/18/2014  . Anemia 07/03/2014  . Vitamin D deficiency 07/03/2014  . Other fatigue 07/03/2014  . Essential hypertension, benign 06/30/2014  . Genital herpes 06/30/2014   Jennifer Carlson-Long, PTA 05/25/2015 4:53  PM  [facility] [address] [place], [place], [address] Phone: [phone]   Fax:  [phone]      

## 2015-05-28 ENCOUNTER — Ambulatory Visit (INDEPENDENT_AMBULATORY_CARE_PROVIDER_SITE_OTHER): Payer: BLUE CROSS/BLUE SHIELD | Admitting: Physical Therapy

## 2015-05-28 DIAGNOSIS — R52 Pain, unspecified: Secondary | ICD-10-CM | POA: Diagnosis not present

## 2015-05-28 DIAGNOSIS — R531 Weakness: Secondary | ICD-10-CM

## 2015-05-28 DIAGNOSIS — M5416 Radiculopathy, lumbar region: Secondary | ICD-10-CM

## 2015-05-28 DIAGNOSIS — Z7409 Other reduced mobility: Secondary | ICD-10-CM

## 2015-05-28 DIAGNOSIS — M2560 Stiffness of unspecified joint, not elsewhere classified: Secondary | ICD-10-CM

## 2015-05-28 NOTE — Therapy (Signed)
[place], [place], [address] Phone: [phone]   Fax:  [phone]  Physical Therapy Treatment  Patient Details  Name: Brittney Young MRN: [id number] Date of Birth: 1971/02/28 Referring Provider:  Gregor Hams, MD  Encounter Date: 05/28/2015      PT End of Session - 05/28/15 1520    Visit Number 3   Number of Visits 16   Date for PT Re-Evaluation 05/30/15   PT Start Time 4403   PT Stop Time 4742   PT Time Calculation (min) 58 min   Activity Tolerance Patient tolerated treatment well      Past Medical History  Diagnosis Date  . Hypertension   . Depression   . Anxiety     Past Surgical History  Procedure Laterality Date  . Cesarean section      There were no vitals filed for this visit.  Visit Diagnosis:  Pain  Decreased strength, endurance, and mobility  Stiffness in joint  Lumbar radiculopathy, acute      Subjective Assessment - 05/28/15 1521    Subjective Pt reports felt good at end of last session, but relief only lasted until she arrived home.  Pain returned.  Only temporary relief with stretches and heat.    Currently in Pain? Yes   Pain Score 6    Pain Location Back   Pain Orientation Left   Pain Descriptors / Indicators Sharp   Aggravating Factors  first standing up, sitting for prolonged periods   Pain Relieving Factors medicine, heat, hooklying position            [facility] PT Assessment - 05/28/15 0001    Assessment   Medical Diagnosis Lt sided low back pain with sciatica   Onset Date/Surgical Date 03/26/15   Hand Dominance Right   Next MD Visit 06/07/15                     [facility] Adult PT Treatment/Exercise - 05/28/15 0001    Lumbar Exercises: Stretches   Passive Hamstring Stretch 2 reps;30 seconds  each leg   Passive Hamstring Stretch Limitations with strap    Lower Trunk Rotation --  10 reps to each side   ITB Stretch 2 reps;30  seconds  each leg; with strap   Lumbar Exercises: Supine   Ab Set 5 reps;5 seconds   Clam 5 reps  each leg with ab set   Heel Slides 5 reps  each leg, with ab set   Bent Knee Raise 10 reps  each leg, with ab set   Bridge 10 reps   Moist Heat Therapy   Number Minutes Moist Heat 15 Minutes   Moist Heat Location Lumbar Spine  into Lt buttock   Electrical Stimulation   Electrical Stimulation Location bilat low back paraspinals and SI joints   Electrical Stimulation Action IFC   Electrical Stimulation Parameters to tolerance    Electrical Stimulation Goals Pain   Manual Therapy   Manual Therapy Joint mobilization   Joint Mobilization grade 3+ to L4 transverse process Lt.  Assessed sacrum and lumbar spinal mobility with grade 3 mobs.  Pt reported decrease in pain during mobilization   performed by supervising PT, Jeral Pinch.                 PT Education - 05/28/15 1613    Education provided Yes   Education Details HEP   Person(s) Educated Patient   Methods Handout;Explanation  Comprehension Verbalized understanding;Returned demonstration          PT Short Term Goals - 05/23/15 1703    PT SHORT TERM GOAL #1   Title I with initial HEP ( 06/13/15)    Time 3   Period Weeks   Status New   PT SHORT TERM GOAL #2   Title Patient to stand upright with good alignment and equal weight bearing bilat LE's 06/13/15)   Time 3   Period Weeks   Status New   PT SHORT TERM GOAL #3   Title increase Lt LE strength =/> 4/5 through out ( 06/13/15)    Time 3   Period Weeks   Status New           PT Long Term Goals - 05/23/15 1704    PT LONG TERM GOAL #1   Title I with advanced HEP ( 07/04/15)    Time 6   Period Weeks   Status New   PT LONG TERM GOAL #2   Title report pain reduction =/> 75% with daily activity ( 07/04/15)    Time 6   Period Weeks   Status New   PT LONG TERM GOAL #3   Title demo Lt LE strength =/> 5-/5 ( 07/04/15)    Time 6   Period Weeks   Status  New   PT LONG TERM GOAL #4   Title Decrease FOTO to </=  33% limitation 07/04/15   Time 6   Period Weeks   Status New   PT LONG TERM GOAL #5   Title lift up to 50# floor to waist without increased pain to assist with return to work ( 07/04/15)    Time 6   Period Weeks   Status New   Additional Long Term Goals   Additional Long Term Goals Yes   PT LONG TERM GOAL #6   Title push/pull =/> 100# on the sled without increased pain to assist with return to work ( 07/04/15)    Time 6   Period Weeks   Status New               Plan - 05/28/15 1605    Clinical Impression Statement Pt tolerated new exercises without increase in pain.  Pt noting decreased back pain with engage core during transitional movements. Pt reported reduction of pain at end of session.  Progressing towards goals.  Pt will benefit from continued PT intervention to maximize function.    Pt will benefit from skilled therapeutic intervention in order to improve on the following deficits Pain;Postural dysfunction;Improper body mechanics;Decreased range of motion;Abnormal gait;Decreased activity tolerance;Decreased endurance;Decreased strength   Rehab Potential Good   PT Frequency 2x / week   PT Duration 6 weeks   PT Treatment/Interventions Patient/family education;ADLs/Self Care Home Management;Therapeutic exercise;Therapeutic activities;Neuromuscular re-education;Cryotherapy;Electrical Stimulation;Moist Heat;Iontophoresis 4mg /ml Dexamethasone;Ultrasound;Manual techniques;Traction;Dry needling   PT Next Visit Plan TPR/STW to Lt buttocks, progress core stability, possibly traction   Consulted and Agree with Plan of Care Patient        Problem List Patient Active Problem List   Diagnosis Date Noted  . Weakness of left hip 05/18/2015  . Lumbago 04/02/2015  . UTI (urinary tract infection) 03/26/2015  . Anxiety and depression 12/18/2014  . Anemia 07/03/2014  . Vitamin D deficiency 07/03/2014  . Other fatigue  07/03/2014  . Essential hypertension, benign 06/30/2014  . Genital herpes 06/30/2014    Kerin Perna, PTA 05/28/2015 4:24 PM  [facility] [address] [place], [place], [address] Phone: [phone]   Fax:  [phone]      

## 2015-05-28 NOTE — Patient Instructions (Signed)
  Abdominal Bracing With Pelvic Floor (Hook-Lying)   With neutral spine, tighten pelvic floor and abdominals. Hold 10 seconds. Repeat __10_ times. Do _1__ times a day.   Knee to Chest: Transverse Plane Stability  With abdominals tightened Bring one knee up, then return. Be sure pelvis does not roll side to side. Keep pelvis still. Lift knee __10_ times each leg. Restabilize pelvis. Repeat with other leg. Do _1-2__ sets, _1__ times per day.    Hip External Rotation With Pillow: Transverse Plane Stability  With abdominals tightened One knee bent, one leg straight, on pillow. Slowly roll bent knee out. Be sure pelvis does not rotate. Do _10__ times. Restabilize pelvis. Repeat with other leg. Do _1-2__ sets, _1__ times per day.  Heel Slide: 4-10 Inches - Transverse Plane Stability  With abdominals tightened Slide heel 4 inches down. Be sure pelvis does not rotate. Do _10__ times. Restabilize pelvis. Repeat with other leg. Do __1_ sets, _1__ times per day.   [facility] [address] [address] [place], [place] [address]  [phone] (office) [phone] (fax)

## 2015-06-01 ENCOUNTER — Ambulatory Visit (INDEPENDENT_AMBULATORY_CARE_PROVIDER_SITE_OTHER): Payer: BLUE CROSS/BLUE SHIELD | Admitting: Physical Therapy

## 2015-06-01 DIAGNOSIS — Z7409 Other reduced mobility: Secondary | ICD-10-CM

## 2015-06-01 DIAGNOSIS — R531 Weakness: Secondary | ICD-10-CM

## 2015-06-01 DIAGNOSIS — M2560 Stiffness of unspecified joint, not elsewhere classified: Secondary | ICD-10-CM

## 2015-06-01 DIAGNOSIS — M5416 Radiculopathy, lumbar region: Secondary | ICD-10-CM | POA: Diagnosis not present

## 2015-06-01 DIAGNOSIS — R52 Pain, unspecified: Secondary | ICD-10-CM

## 2015-06-01 DIAGNOSIS — R6889 Other general symptoms and signs: Secondary | ICD-10-CM

## 2015-06-01 NOTE — Patient Instructions (Signed)
Handout for extension POE and Press UPs
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
## 2015-06-01 NOTE — Therapy (Signed)
[place], [place], [address] Phone: [phone]   Fax:  [phone]  Physical Therapy Treatment  Patient Details  Name: Brittney Young MRN: [id number] Date of Birth: [birth year] Referring Provider:  Gregor Hams, MD  Encounter Date: 06/01/2015    Past Medical History  Diagnosis Date  . Hypertension   . Depression   . Anxiety     Past Surgical History  Procedure Laterality Date  . Cesarean section      There were no vitals filed for this visit.  Visit Diagnosis:  Pain  Decreased strength, endurance, and mobility  Stiffness in joint  Lumbar radiculopathy, acute                                 PT Short Term Goals - 05/23/15 1703    PT SHORT TERM GOAL #1   Title I with initial HEP ( 06/13/15)    Time 3   Period Weeks   Status New   PT SHORT TERM GOAL #2   Title Patient to stand upright with good alignment and equal weight bearing bilat LE's 06/13/15)   Time 3   Period Weeks   Status New   PT SHORT TERM GOAL #3   Title increase Lt LE strength =/> 4/5 through out ( 06/13/15)    Time 3   Period Weeks   Status New           PT Long Term Goals - 05/23/15 1704    PT LONG TERM GOAL #1   Title I with advanced HEP ( 07/04/15)    Time 6   Period Weeks   Status New   PT LONG TERM GOAL #2   Title report pain reduction =/> 75% with daily activity ( 07/04/15)    Time 6   Period Weeks   Status New   PT LONG TERM GOAL #3   Title demo Lt LE strength =/> 5-/5 ( 07/04/15)    Time 6   Period Weeks   Status New   PT LONG TERM GOAL #4   Title Decrease FOTO to </=  33% limitation 07/04/15   Time 6   Period Weeks   Status New   PT LONG TERM GOAL #5   Title lift up to 50# floor to waist without increased pain to assist with return to work ( 07/04/15)    Time 6   Period Weeks   Status New   Additional Long Term Goals   Additional Long Term Goals Yes    PT LONG TERM GOAL #6   Title push/pull =/> 100# on the sled without increased pain to assist with return to work ( 07/04/15)    Time 6   Period Weeks   Status New               Problem List Patient Active Problem List   Diagnosis Date Noted  . Weakness of left hip 05/18/2015  . Lumbago 04/02/2015  . UTI (urinary tract infection) 03/26/2015  . Anxiety and depression 12/18/2014  . Anemia 07/03/2014  . Vitamin D deficiency 07/03/2014  . Other fatigue 07/03/2014  . Essential hypertension, benign 06/30/2014  . Genital herpes 06/30/2014    Natividad Brood 06/01/2015, 3:29 PM  [facility] [place], [place], [address] Phone: [phone]   Fax:  [phone]

## 2015-06-06 ENCOUNTER — Ambulatory Visit (INDEPENDENT_AMBULATORY_CARE_PROVIDER_SITE_OTHER): Payer: BLUE CROSS/BLUE SHIELD | Admitting: Physical Therapy

## 2015-06-06 DIAGNOSIS — M2560 Stiffness of unspecified joint, not elsewhere classified: Secondary | ICD-10-CM

## 2015-06-06 DIAGNOSIS — R52 Pain, unspecified: Secondary | ICD-10-CM | POA: Diagnosis not present

## 2015-06-06 DIAGNOSIS — M5416 Radiculopathy, lumbar region: Secondary | ICD-10-CM | POA: Diagnosis not present

## 2015-06-06 DIAGNOSIS — Z7409 Other reduced mobility: Secondary | ICD-10-CM | POA: Diagnosis not present

## 2015-06-06 DIAGNOSIS — R531 Weakness: Secondary | ICD-10-CM

## 2015-06-06 DIAGNOSIS — R6889 Other general symptoms and signs: Secondary | ICD-10-CM

## 2015-06-06 NOTE — Therapy (Signed)
[place], [place], [address] Phone: [phone]   Fax:  [phone]  Physical Therapy Treatment  Patient Details  Name: Brittney Young MRN: [id number] Date of Birth: 08-02-1971 Referring Provider:  Gregor Hams, MD  Encounter Date: 06/06/2015      PT End of Session - 06/06/15 1516    Visit Number 5   Number of Visits 16   Date for PT Re-Evaluation 05/30/15   PT Start Time 6378   PT Stop Time 1603   PT Time Calculation (min) 48 min   Activity Tolerance Patient limited by pain      Past Medical History  Diagnosis Date  . Hypertension   . Depression   . Anxiety     Past Surgical History  Procedure Laterality Date  . Cesarean section      There were no vitals filed for this visit.  Visit Diagnosis:  Pain  Decreased strength, endurance, and mobility  Stiffness in joint  Lumbar radiculopathy, acute      Subjective Assessment - 06/06/15 1516    Subjective Pt reports her pain is a little more than last wk. Feels ok for about 1 hr after completing exercises then pain returns, even worse.     Currently in Pain? Yes   Pain Score 6    Pain Location Back   Pain Orientation Left   Pain Descriptors / Indicators Aching;Sharp   Aggravating Factors  bending, transition movements    Pain Relieving Factors medicine             [facility] PT Assessment - 06/06/15 0001    Assessment   Medical Diagnosis Lt sided low back pain with sciatica   Onset Date/Surgical Date 03/26/15   Hand Dominance Right   Next MD Visit 06/07/15   Strength   Right/Left Hip Left   Left Hip Flexion 4+/5  with pain in LB   Left Hip Extension 4-/5   Left Hip ABduction 4+/5           [facility] Adult PT Treatment/Exercise - 06/06/15 0001    Exercises   Exercises Lumbar   Lumbar Exercises: Stretches   Passive Hamstring Stretch 2 reps;30 seconds   Passive Hamstring Stretch Limitations strap   Single Knee to  Chest Stretch 2 reps;30 seconds  with towel assist on LLE   ITB Stretch 2 reps;30 seconds  with strap    Lumbar Exercises: Aerobic   Stationary Bike Nustep L4X6'   Lumbar Exercises: Prone   Other Prone Lumbar Exercises prone press ups (to tolerance) x 8 reps    Lumbar Exercises: Quadruped   [place] 10 reps  gentle   Straight Leg Raise 5 reps  each leg, with core engaged. No pain   Modalities   Modalities Electrical Stimulation;Moist Heat   Moist Heat Therapy   Number Minutes Moist Heat 15 Minutes   Moist Heat Location Lumbar Spine;Hip   Electrical Stimulation   Electrical Stimulation Location Lt SI joint and glute   Electrical Stimulation Action IFC   Electrical Stimulation Parameters to tolerance   Electrical Stimulation Goals Pain   Manual Therapy   Manual Therapy Joint mobilization;Soft tissue mobilization  performed by supervising PT, Jeral Pinch.    Manual therapy comments Trigger point release through Lt glute/ piriformis    Joint Mobilization Lt L5 articular pillar mobs 3+, Lt SI joint PA mobs grade 3.  PT Short Term Goals - 05/23/15 1703    PT SHORT TERM GOAL #1   Title I with initial HEP ( 06/13/15)    Time 3   Period Weeks   Status New   PT SHORT TERM GOAL #2   Title Patient to stand upright with good alignment and equal weight bearing bilat LE's 06/13/15)   Time 3   Period Weeks   Status New   PT SHORT TERM GOAL #3   Title increase Lt LE strength =/> 4/5 through out ( 06/13/15)    Time 3   Period Weeks   Status New           PT Long Term Goals - 05/23/15 1704    PT LONG TERM GOAL #1   Title I with advanced HEP ( 07/04/15)    Time 6   Period Weeks   Status New   PT LONG TERM GOAL #2   Title report pain reduction =/> 75% with daily activity ( 07/04/15)    Time 6   Period Weeks   Status New   PT LONG TERM GOAL #3   Title demo Lt LE strength =/> 5-/5 ( 07/04/15)    Time 6   Period Weeks   Status New   PT  LONG TERM GOAL #4   Title Decrease FOTO to </=  33% limitation 07/04/15   Time 6   Period Weeks   Status New   PT LONG TERM GOAL #5   Title lift up to 50# floor to waist without increased pain to assist with return to work ( 07/04/15)    Time 6   Period Weeks   Status New   Additional Long Term Goals   Additional Long Term Goals Yes   PT LONG TERM GOAL #6   Title push/pull =/> 100# on the sled without increased pain to assist with return to work ( 07/04/15)    Time 6   Period Weeks   Status New               Plan - 06/06/15 1551    Clinical Impression Statement Pt demo improved LE strength despite her decreased activity tolerance this session due to increased pain.  Pt reported decreased LB pain with engaging core, however needs cues to reinforce this action.  Pt reported decrease in pain with manual therapy and estim.     Pt will benefit from skilled therapeutic intervention in order to improve on the following deficits Pain;Postural dysfunction;Improper body mechanics;Decreased range of motion;Abnormal gait;Decreased activity tolerance;Decreased endurance;Decreased strength   Rehab Potential Good   PT Frequency 2x / week   PT Duration 6 weeks   PT Treatment/Interventions Patient/family education;ADLs/Self Care Home Management;Therapeutic exercise;Therapeutic activities;Neuromuscular re-education;Cryotherapy;Electrical Stimulation;Moist Heat;Iontophoresis 4mg /ml Dexamethasone;Ultrasound;Manual techniques;Traction;Dry needling   PT Next Visit Plan Traction is she is still having sciatic pain. strength, ROM, Pain relief   Consulted and Agree with Plan of Care Patient        Problem List Patient Active Problem List   Diagnosis Date Noted  . Weakness of left hip 05/18/2015  . Lumbago 04/02/2015  . UTI (urinary tract infection) 03/26/2015  . Anxiety and depression 12/18/2014  . Anemia 07/03/2014  . Vitamin D deficiency 07/03/2014  . Other fatigue 07/03/2014  . Essential  hypertension, benign 06/30/2014  . Genital herpes 06/30/2014    Kerin Perna, PTA 06/06/2015 3:57 PM  [place], [place], [address] Phone: [phone]  Fax:  [phone]

## 2015-06-07 ENCOUNTER — Ambulatory Visit (INDEPENDENT_AMBULATORY_CARE_PROVIDER_SITE_OTHER): Payer: BLUE CROSS/BLUE SHIELD | Admitting: Family Medicine

## 2015-06-07 ENCOUNTER — Encounter: Payer: Self-pay | Admitting: Family Medicine

## 2015-06-07 VITALS — BP 133/94 | HR 81 | Wt 192.0 lb

## 2015-06-07 DIAGNOSIS — R29898 Other symptoms and signs involving the musculoskeletal system: Secondary | ICD-10-CM

## 2015-06-07 DIAGNOSIS — M7062 Trochanteric bursitis, left hip: Secondary | ICD-10-CM | POA: Diagnosis not present

## 2015-06-07 DIAGNOSIS — M5442 Lumbago with sciatica, left side: Secondary | ICD-10-CM

## 2015-06-07 NOTE — Patient Instructions (Signed)
Thank you for coming in today. Call or go to the ER if you develop a large red swollen joint with extreme pain or oozing puss.  Continue PT.  Return before Oct 14th.   Hip Bursitis Bursitis is a swelling and soreness (inflammation) of a fluid-filled sac (bursa). This sac overlies and protects the joints.  CAUSES   Injury.  Overuse of the muscles surrounding the joint.  Arthritis.  Gout.  Infection.  Cold weather.  Inadequate warm-up and conditioning prior to activities. The cause may not be known.  SYMPTOMS   Mild to severe irritation.  Tenderness and swelling over the outside of the hip.  Pain with motion of the hip.  If the bursa becomes infected, a fever may be present. Redness, tenderness, and warmth will develop over the hip. Symptoms usually lessen in 3 to 4 weeks with treatment, but can come back. TREATMENT If conservative treatment does not work, your caregiver may advise draining the bursa and injecting cortisone into the area. This may speed up the healing process. This may also be used as an initial treatment of choice. HOME CARE INSTRUCTIONS   Apply ice to the affected area for 15-20 minutes every 3 to 4 hours while awake for the first 2 days. Put the ice in a plastic bag and place a towel between the bag of ice and your skin.  Rest the painful joint as much as possible, but continue to put the joint through a normal range of motion at least 4 times per day. When the pain lessens, begin normal, slow movements and usual activities to help prevent stiffness of the hip.  Only take over-the-counter or prescription medicines for pain, discomfort, or fever as directed by your caregiver.  Use crutches to limit weight bearing on the hip joint, if advised.  Elevate your painful hip to reduce swelling. Use pillows for propping and cushioning your legs and hips.  Gentle massage may provide comfort and decrease swelling. SEEK IMMEDIATE MEDICAL CARE IF:   Your pain  increases even during treatment, or you are not improving.  You have a fever.  You have heat and inflammation over the involved bursa.  You have any other questions or concerns. MAKE SURE YOU:   Understand these instructions.  Will watch your condition.  Will get help right away if you are not doing well or get worse. Document Released: 02/14/2002 Document Revised: 11/17/2011 Document Reviewed: 09/13/2008 [facility] Patient Information 2015 [place], [place]. This information is not intended to replace advice given to you by your health care provider. Make sure you discuss any questions you have with your health care provider.

## 2015-06-08 ENCOUNTER — Ambulatory Visit (INDEPENDENT_AMBULATORY_CARE_PROVIDER_SITE_OTHER): Payer: BLUE CROSS/BLUE SHIELD | Admitting: Rehabilitative and Restorative Service Providers"

## 2015-06-08 ENCOUNTER — Encounter: Payer: Self-pay | Admitting: Rehabilitative and Restorative Service Providers"

## 2015-06-08 DIAGNOSIS — R52 Pain, unspecified: Secondary | ICD-10-CM | POA: Diagnosis not present

## 2015-06-08 DIAGNOSIS — R531 Weakness: Secondary | ICD-10-CM

## 2015-06-08 DIAGNOSIS — M7062 Trochanteric bursitis, left hip: Secondary | ICD-10-CM | POA: Insufficient documentation

## 2015-06-08 DIAGNOSIS — M5416 Radiculopathy, lumbar region: Secondary | ICD-10-CM

## 2015-06-08 DIAGNOSIS — Z7409 Other reduced mobility: Secondary | ICD-10-CM | POA: Diagnosis not present

## 2015-06-08 DIAGNOSIS — M2560 Stiffness of unspecified joint, not elsewhere classified: Secondary | ICD-10-CM

## 2015-06-08 NOTE — Patient Instructions (Signed)
3 part core!!!! All day every day!    Marching - on back - tighten core - bring one knee up pause 1-2 seconds lower, then bring opposite knee up    Lower Abdominal (Dead Bug): Strength Get ON TARGET/tighten core! Lie on back. Bend legs  As you raise arms overhead =  maintain abdominal tension. Hold each position _2-3__ seconds. Repeat _10__ times. Do __2_ sessions per day.   Shoulder Flexion: Hands and Knees (Single Arm) Raise arm forward, pause 1-2 sec  Repeat _10_ times per set. Repeat with other arm. Do 1-2 sets per session. Do 1-2__ sessions per day   Mule Kick: Hands and Knees (Single Leg) On hands and knees, extend leg back, straightening knee, lift toe 4-6 inches from floor. Hold 1-2 sec Repeat _5-10_ times per set alternating legs. Do _1-2_ sets per session. Do _1-2_ sessions per day   Trunk: Prone Extension (Press-Ups) Lie on stomach on firm, flat surface. Relax bottom and legs. Raise chest in air with elbows straight. Keep hips flat on surface, sag stomach. Hold _2-3___ seconds. Repeat ___10_ times. Do _4-5___ sessions per day. CAUTION: Movement should be gentle and slow.

## 2015-06-08 NOTE — Therapy (Signed)
Brittney Young, [place], [address] Phone: [phone]   Fax:  [phone]  Physical Therapy Treatment  Patient Details  Name: Brittney Young MRN: [id number] Date of Birth: 02/22/1971 Referring Kayley Zeiders:  Gregor Hams, MD  Encounter Date: 06/08/2015      PT End of Session - 06/08/15 1534    Visit Number 6   Number of Visits 16   Date for PT Re-Evaluation 07/06/15   PT Start Time 3825   PT Stop Time 1624   PT Time Calculation (min) 50 min   Activity Tolerance Patient tolerated treatment well      Past Medical History  Diagnosis Date  . Hypertension   . Depression   . Anxiety     Past Surgical History  Procedure Laterality Date  . Cesarean section      There were no vitals filed for this visit.  Visit Diagnosis:  Pain - Plan: PT plan of care cert/re-cert  Decreased strength, endurance, and mobility - Plan: PT plan of care cert/re-cert  Stiffness in joint - Plan: PT plan of care cert/re-cert  Lumbar radiculopathy, acute - Plan: PT plan of care cert/re-cert      Subjective Assessment - 06/08/15 1535    Subjective Brittney Young reports that she had a cortisone injection in Lt hip yesterday for bursitis. She has some soreness but it may have helped some. Back is feeling a little better - not as bad. Feels she is improving but cotinues to have symptoms in ack but symptoms in her leg have resolved.    Currently in Pain? Yes   Pain Score 4    Pain Location Back   Pain Orientation Left;Lower   Pain Descriptors / Indicators Nagging   Pain Type Acute pain            [facility] PT Assessment - 06/08/15 0001    Assessment   Medical Diagnosis Lt sided low back pain with sciatica   Onset Date/Surgical Date 03/26/15   Hand Dominance Right   Next MD Visit 06/18/15   Strength   Right/Left Hip Left   Left Hip Flexion 4+/5  with pain in LB   Left Hip Extension 4-/5   Left Hip ABduction 4+/5                      [facility] Adult PT Treatment/Exercise - 06/08/15 0001    Exercises   Exercises Lumbar   Lumbar Exercises: Stretches   Passive Hamstring Stretch 2 reps;30 seconds   Passive Hamstring Stretch Limitations strap   Prone on Elbows Stretch --  2-3 min    Press Ups --  10 reps 2-3 sec pause arms closer to chest    Press Ups Limitations increase extension with arms closer to chest add pause   ITB Stretch 2 reps;30 seconds  with strap    Lumbar Exercises: Aerobic   Stationary Bike Nustep L4X5'   Lumbar Exercises: Supine   Ab Set 10 reps  10 sec hold   Bent Knee Raise 10 reps;1 second;2 seconds   Dead Bug 10 reps;1 second;2 seconds   Lumbar Exercises: Quadruped   Single Arm Raise 10 reps;1 second;2 seconds  engaging core   Straight Leg Raise 5 reps  engagning core foot 4-6 inches from table to recruit lumbar    Modalities   Modalities Electrical Stimulation;Moist Heat   Moist Heat Therapy   Number Minutes Moist Heat 15 Minutes   Moist  Heat Location Lumbar Spine;Hip   Electrical Stimulation   Electrical Stimulation Location bilat L5; Lt SI; Lt hip   Electrical Stimulation Action IFC   Electrical Stimulation Parameters to tolerance   Electrical Stimulation Goals Pain   Manual Therapy   Manual Therapy Joint mobilization;Soft tissue mobilization   Manual therapy comments Trigger point release through Lt glute/ piriformis    Joint Mobilization CPA L5; Lt transverse process mobs Grade II/III   Myofascial Release to Lt piriformis, gluts and lumbar paraspinals                 PT Education - 06/08/15 1603    Education provided Yes   Education Details McKenzie extension program added stabilization for Brittney Young) Educated Patient   Methods Explanation;Demonstration;Tactile cues;Verbal cues;Handout   Comprehension Verbalized understanding;Returned demonstration;Verbal cues required;Tactile cues required          PT Short Term Goals - 05/23/15  1703    PT SHORT TERM GOAL #1   Title I with initial HEP ( 06/13/15)    Time 3   Period Weeks   Status New   PT SHORT TERM GOAL #2   Title Patient to stand upright with good alignment and equal weight bearing bilat LE's 06/13/15)   Time 3   Period Weeks   Status New   PT SHORT TERM GOAL #3   Title increase Lt LE strength =/> 4/5 through out ( 06/13/15)    Time 3   Period Weeks   Status New           PT Long Term Goals - 05/23/15 1704    PT LONG TERM GOAL #1   Title I with advanced HEP ( 07/04/15)    Time 6   Period Weeks   Status New   PT LONG TERM GOAL #2   Title report pain reduction =/> 75% with daily activity ( 07/04/15)    Time 6   Period Weeks   Status New   PT LONG TERM GOAL #3   Title demo Lt LE strength =/> 5-/5 ( 07/04/15)    Time 6   Period Weeks   Status New   PT LONG TERM GOAL #4   Title Decrease FOTO to </=  33% limitation 07/04/15   Time 6   Period Weeks   Status New   PT LONG TERM GOAL #5   Title lift up to 50# floor to waist without increased pain to assist with return to work ( 07/04/15)    Time 6   Period Weeks   Status New   Additional Long Term Goals   Additional Long Term Goals Yes   PT LONG TERM GOAL #6   Title push/pull =/> 100# on the sled without increased pain to assist with return to work ( 07/04/15)    Time 6   Period Weeks   Status New               Plan - 06/08/15 1624    Clinical Impression Statement Tolerated McKenzie extension exercises and manual work well. Hopefully positive response to Lt hip injection. Discussion reveals that patient sleeps on stomach with one knee/hip ER and abducted in flexed position. Advised patient to avoid this due to stress through hip/pelvis and L spine. She will focus on core stabilization and extension exercises. Start a walking program level surfaces 10-20 min/day. Returns to MD 06/17/14/    Pt will benefit from skilled therapeutic intervention in order to improve  on the following  deficits Pain;Postural dysfunction;Improper body mechanics;Decreased range of motion;Abnormal gait;Decreased activity tolerance;Decreased endurance;Decreased strength   Rehab Potential Good   PT Frequency 2x / week   PT Duration 6 weeks   PT Treatment/Interventions Patient/family education;ADLs/Self Care Home Management;Therapeutic exercise;Therapeutic activities;Neuromuscular re-education;Cryotherapy;Electrical Stimulation;Moist Heat;Iontophoresis 4mg /ml Dexamethasone;Ultrasound;Manual techniques;Traction;Dry needling   PT Next Visit Plan Assess response to extension program; progress as indicated.    PT Home Exercise Plan core stabilization/McKenzie   Consulted and Agree with Plan of Care Patient        Problem List Patient Active Problem List   Diagnosis Date Noted  . Greater trochanteric bursitis of left hip 06/08/2015  . Lumbago 04/02/2015  . Anxiety and depression 12/18/2014  . Anemia 07/03/2014  . Vitamin D deficiency 07/03/2014  . Other fatigue 07/03/2014  . Essential hypertension, benign 06/30/2014  . Genital herpes 06/30/2014    Celyn Nilda Simmer PT, MPH 06/08/2015, 4:31 PM  [facility] [place] [address] [place], [place], [address] Phone: [phone]   Fax:  [phone]

## 2015-06-08 NOTE — Progress Notes (Signed)
Brittney Young is a 44 y.o. female who presents to [facility]  today for follow-up back. Patient has a long recent history for back pain.  She is unable to work because of her back pain radiating to her left leg. MRI showed bulging disks and nerve root impingement. She had considerable leg symptom improvement with epidural steroid injection. However at the last visit on September 9 she was found to have weakness especially with her left hip extensors. She's continued physical therapy and notes that she is doing quite well. Her strength has improved dramatically. She continues to experience mild to moderate intermittent low back pain. Additionally she has a new left lateral hip pain. This is worse when she lays on her left side and when she stands from a seated position. She has not had any special treatment for this yet. Her current FMLA paperwork extends to October 15.   Past Medical History  Diagnosis Date  . Hypertension   . Depression   . Anxiety    Past Surgical History  Procedure Laterality Date  . Cesarean section     Social History  Substance Use Topics  . Smoking status: Never Smoker   . Smokeless tobacco: Not on file  . Alcohol Use: No   family history includes Cancer in her sister; Stroke in her mother.  ROS as above Medications: Current Outpatient Prescriptions  Medication Sig Dispense Refill  . citalopram (CELEXA) 20 MG tablet Take 1 tablet (20 mg total) by mouth daily. (Patient not taking: Reported on 06/07/2015) 30 tablet 4  . cyclobenzaprine (FLEXERIL) 10 MG tablet Take 1 tablet (10 mg total) by mouth 3 (three) times daily as needed for muscle spasms. (Patient not taking: Reported on 06/07/2015) 90 tablet 1  . gabapentin (NEURONTIN) 600 MG tablet Take 1 tablet (600 mg total) by mouth 3 (three) times daily. (Patient not taking: Reported on 05/23/2015) 90 tablet 1  . hydrochlorothiazide (HYDRODIURIL) 25 MG tablet Take 1 tablet (25 mg  total) by mouth daily. (Patient not taking: Reported on 06/07/2015) 30 tablet 5  . HYDROcodone-acetaminophen (NORCO/VICODIN) 5-325 MG per tablet Take 1 tablet by mouth every 6 (six) hours as needed for moderate pain.    . metoprolol tartrate (LOPRESSOR) 25 MG tablet Take 1 tablet (25 mg total) by mouth 2 (two) times daily. (Patient not taking: Reported on 06/07/2015) 60 tablet 5  . POLY-IRON 150 150 MG capsule take 1 capsule by mouth twice a day for ANEMIA. (Patient not taking: Reported on 06/07/2015) 60 capsule 3  . potassium chloride SA (K-DUR,KLOR-CON) 20 MEQ tablet Take 1 tablet (20 mEq total) by mouth daily with breakfast. (Patient not taking: Reported on 06/07/2015) 30 tablet 5  . Vitamin D, Ergocalciferol, (DRISDOL) 50000 UNITS CAPS capsule Take 1 capsule (50,000 Units total) by mouth every 7 (seven) days. (Patient not taking: Reported on 06/07/2015) 12 capsule 1   No current facility-administered medications for this visit.   No Known Allergies   Exam:  BP 133/94 mmHg  Pulse 81  Wt 192 lb (87.091 kg) Gen: Well NAD HEENT: EOMI,  MMM Lungs: Normal work of breathing. CTABL Heart: RRR no MRG Abd: NABS, Soft. Nondistended, Nontender Exts: Brisk capillary refill, warm and well perfused.  Back is nontender to midline. Hips bilaterally have normal motion The hip strength is intact especially to extension. Patient is now able to climb stairs without weakness. The left lateral hip is tender in the area of the left greater trochanter. She  has mild left hip abduction weakness 4+/5. Capillary refill and sensation are intact distally. Reflexes are intact throughout.   Greater trochanteric bursa injection: Left Consent obtained and timeout performed.  Patient laying on side with affected hip up.   Area located and marked.   Skin cleaned with rubbing alcohol and chlorhexidine, and cold spray applied Using a spinal needle the greater trochanteric bursa was accessed.   40 mg of Kenalog and 4 mL of  Marcaine were injected in a wheel pattern.   Immediate improvement following injection: yes Patient tolerated procedure well with no weakness or numbness or bleeding.    No results found for this or any previous visit (from the past 24 hour(s)). No results found.   Please see individual assessment and plan sections.

## 2015-06-08 NOTE — Assessment & Plan Note (Signed)
Improving with physical therapy. Recheck in 2 weeks

## 2015-06-08 NOTE — Assessment & Plan Note (Signed)
Resolved

## 2015-06-08 NOTE — Assessment & Plan Note (Addendum)
Status post injection. Continue physical therapy. Recheck just prior to October 14 and 15th. We'll readdress work situation that visit.  New problem today

## 2015-06-11 ENCOUNTER — Encounter: Payer: BLUE CROSS/BLUE SHIELD | Admitting: Rehabilitative and Restorative Service Providers"

## 2015-06-13 ENCOUNTER — Ambulatory Visit (INDEPENDENT_AMBULATORY_CARE_PROVIDER_SITE_OTHER): Payer: BLUE CROSS/BLUE SHIELD | Admitting: Rehabilitative and Restorative Service Providers"

## 2015-06-13 ENCOUNTER — Encounter: Payer: Self-pay | Admitting: Rehabilitative and Restorative Service Providers"

## 2015-06-13 DIAGNOSIS — M5416 Radiculopathy, lumbar region: Secondary | ICD-10-CM

## 2015-06-13 DIAGNOSIS — R52 Pain, unspecified: Secondary | ICD-10-CM

## 2015-06-13 DIAGNOSIS — M2560 Stiffness of unspecified joint, not elsewhere classified: Secondary | ICD-10-CM

## 2015-06-13 DIAGNOSIS — Z7409 Other reduced mobility: Secondary | ICD-10-CM | POA: Diagnosis not present

## 2015-06-13 DIAGNOSIS — R531 Weakness: Secondary | ICD-10-CM

## 2015-06-13 NOTE — Therapy (Signed)
[facility] [address] [place], [place], [address] Phone: [phone]   Fax:  [phone]  Physical Therapy Treatment  Patient Details  Name: Brittney Young MRN: [id number] Date of Birth: Jul 22, 1971 Referring Provider:  Gregor Hams, MD  Encounter Date: 06/13/2015      PT End of Session - 06/13/15 1535    Visit Number 7   Number of Visits 16   Date for PT Re-Evaluation 07/06/15   PT Start Time 4174   PT Stop Time 1603   PT Time Calculation (min) 47 min   Activity Tolerance Patient tolerated treatment well      Past Medical History  Diagnosis Date  . Hypertension   . Depression   . Anxiety     Past Surgical History  Procedure Laterality Date  . Cesarean section      There were no vitals filed for this visit.  Visit Diagnosis:  Pain  Decreased strength, endurance, and mobility  Stiffness in joint  Lumbar radiculopathy, acute      Subjective Assessment - 06/13/15 1536    Subjective Patient reports that she continues to have some pain in the back and hip. She is workingon her exercises at home. Can tell she is making progress.    Currently in Pain? Yes   Pain Score 4    Pain Location Back   Pain Orientation Left;Lower   Pain Descriptors / Indicators Burning   Pain Type Acute pain   Pain Radiating Towards Lt lumbar spine area to Lt hip   Pain Onset More than a month ago   Pain Frequency Intermittent                         [facility] Adult PT Treatment/Exercise - 06/13/15 0001    Exercises   Exercises Lumbar   Lumbar Exercises: Stretches   Passive Hamstring Stretch 2 reps;30 seconds   Passive Hamstring Stretch Limitations strap   Press Ups --  10 reps 2-3 sec pause arms closer to chest    Press Ups Limitations increase extension with arms closer to chest add pause   ITB Stretch 2 reps;30 seconds  with strap    Lumbar Exercises: Aerobic   Stationary Bike Nustep L4X5'   Lumbar Exercises: Standing   Wall Slides 10 reps;5 seconds   Scapular Retraction Strengthening;Both;20 reps;Theraband   Theraband Level (Scapular Retraction) Level 2 (Red)   Row Strengthening;Both;20 reps;Theraband   Theraband Level (Row) Level 2 (Red)   Shoulder Extension Strengthening;Both;20 reps;Theraband   Theraband Level (Shoulder Extension) Level 2 (Red)   Lumbar Exercises: Supine   Ab Set 10 reps  10 sec hold   Bent Knee Raise 10 reps;1 second;2 seconds   Dead Bug 10 reps;1 second;2 seconds   Bridge 10 reps;5 seconds   Lumbar Exercises: Quadruped   Single Arm Raise 10 reps;1 second;2 seconds  engaging core   Straight Leg Raise --  engagning core foot 4-6 inches from table to recruit lumbar    Modalities   Modalities Electrical Stimulation;Moist Heat   Moist Heat Therapy   Number Minutes Moist Heat 15 Minutes   Moist Heat Location Lumbar Spine;Hip   Electrical Stimulation   Electrical Stimulation Location bolat L5; Lt SI; Lt hip   Electrical Stimulation Action IFC   Electrical Stimulation Parameters to tolerance   Electrical Stimulation Goals Pain   Manual Therapy   Manual Therapy Joint mobilization;Soft tissue mobilization   Manual therapy comments  Trigger point release through Lt glute/ piriformis    Joint Mobilization CPA L5; Lt transverse process mobs Grade II/III   Soft tissue mobilization Lt hip   Myofascial Release to Lt piriformis, gluts and lumbar paraspinals                 PT Education - 06/13/15 1545    Education provided Yes   Education Details progressed exercises working in standing with core engaged   [employer]) Educated Patient   Methods Explanation;Demonstration;Tactile cues;Verbal cues;Handout   Comprehension Verbalized understanding;Returned demonstration;Verbal cues required;Tactile cues required          PT Short Term Goals - 05/23/15 1703    PT SHORT TERM GOAL #1   Title I with initial HEP ( 06/13/15)    Time 3   Period Weeks    Status New   PT SHORT TERM GOAL #2   Title Patient to stand upright with good alignment and equal weight bearing bilat LE's 06/13/15)   Time 3   Period Weeks   Status New   PT SHORT TERM GOAL #3   Title increase Lt LE strength =/> 4/5 through out ( 06/13/15)    Time 3   Period Weeks   Status New           PT Long Term Goals - 05/23/15 1704    PT LONG TERM GOAL #1   Title I with advanced HEP ( 07/04/15)    Time 6   Period Weeks   Status New   PT LONG TERM GOAL #2   Title report pain reduction =/> 75% with daily activity ( 07/04/15)    Time 6   Period Weeks   Status New   PT LONG TERM GOAL #3   Title demo Lt LE strength =/> 5-/5 ( 07/04/15)    Time 6   Period Weeks   Status New   PT LONG TERM GOAL #4   Title Decrease FOTO to </=  33% limitation 07/04/15   Time 6   Period Weeks   Status New   PT LONG TERM GOAL #5   Title lift up to 50# floor to waist without increased pain to assist with return to work ( 07/04/15)    Time 6   Period Weeks   Status New   Additional Long Term Goals   Additional Long Term Goals Yes   PT LONG TERM GOAL #6   Title push/pull =/> 100# on the sled without increased pain to assist with return to work ( 07/04/15)    Time 6   Period Weeks   Status New               Plan - 06/13/15 1600    Clinical Impression Statement Gradual progress continues. RTD 06/18/15. Remains out of work. Continues to have intermitent radicular pain as well as central LBP. Reassess for note to MD at next visit.   Pt will benefit from skilled therapeutic intervention in order to improve on the following deficits Pain;Postural dysfunction;Improper body mechanics;Decreased range of motion;Abnormal gait;Decreased activity tolerance;Decreased endurance;Decreased strength   Rehab Potential Good   PT Frequency 2x / week   PT Duration 6 weeks   PT Treatment/Interventions Patient/family education;ADLs/Self Care Home Management;Therapeutic exercise;Therapeutic  activities;Neuromuscular re-education;Cryotherapy;Electrical Stimulation;Moist Heat;Iontophoresis 4mg /ml Dexamethasone;Ultrasound;Manual techniques;Traction;Dry needling   PT Next Visit Plan responsed to extension program; progress as indicated. Note to MD at next visit Friday   PT Home Exercise Plan core stabilization/McKenzie   Consulted and Agree  with Plan of Care Patient        Problem List Patient Active Problem List   Diagnosis Date Noted  . Greater trochanteric bursitis of left hip 06/08/2015  . Lumbago 04/02/2015  . Anxiety and depression 12/18/2014  . Anemia 07/03/2014  . Vitamin D deficiency 07/03/2014  . Other fatigue 07/03/2014  . Essential hypertension, benign 06/30/2014  . Genital herpes 06/30/2014    Thanh Mottern Nilda Simmer PT, MPH 06/13/2015, 4:06 PM  [facility] [place], [place], [address] Phone: [phone]   Fax:  [phone]

## 2015-06-13 NOTE — Patient Instructions (Signed)
Back Wall Slide    With feet _8-10___ inches from wall, lean as much of back against the wall as possible. Gently squat down _8-10__ inches, keeping back against wall.  Hold __5-10__ seconds while counting out loud. Repeat _10___ times. Do ___2-3_ sessions per day.  Resisted External Rotation: in Neutral - Bilateral   PALMS UP Sit or stand, tubing in both hands, elbows at sides, bent to 90, forearms forward. Pinch shoulder blades together and rotate forearms out. Keep elbows at sides. Repeat __10__ times per set. Do _2-3___ sets per session. Do _2-3___ sessions per day.   Low Row: Standing   Face anchor, feet shoulder width apart. Palms up, pull arms back, squeezing shoulder blades together. Repeat 10__ times per set. Do 2-3__ sets per session. Do 2-3__ sessions per week. Anchor Height: Waist     Strengthening: Resisted Extension   Hold tubing in right hand, arm forward. Pull arm back, elbow straight. Repeat _10___ times per set. Do 2-3____ sets per session. Do 2-3____ sessions per day.

## 2015-06-15 ENCOUNTER — Encounter: Payer: Self-pay | Admitting: Rehabilitative and Restorative Service Providers"

## 2015-06-15 ENCOUNTER — Ambulatory Visit (INDEPENDENT_AMBULATORY_CARE_PROVIDER_SITE_OTHER): Payer: BLUE CROSS/BLUE SHIELD | Admitting: Rehabilitative and Restorative Service Providers"

## 2015-06-15 DIAGNOSIS — R52 Pain, unspecified: Secondary | ICD-10-CM

## 2015-06-15 DIAGNOSIS — M5416 Radiculopathy, lumbar region: Secondary | ICD-10-CM | POA: Diagnosis not present

## 2015-06-15 DIAGNOSIS — Z7409 Other reduced mobility: Secondary | ICD-10-CM | POA: Diagnosis not present

## 2015-06-15 DIAGNOSIS — M2560 Stiffness of unspecified joint, not elsewhere classified: Secondary | ICD-10-CM

## 2015-06-15 DIAGNOSIS — R531 Weakness: Secondary | ICD-10-CM

## 2015-06-15 NOTE — Patient Instructions (Signed)
Pelvic press in prone x 10  Pelvic press with bent knee extension x5 x2 sets

## 2015-06-15 NOTE — Therapy (Signed)
[facility] [place], [place], [address] Phone: [phone]   Fax:  [phone]  Physical Therapy Treatment  Patient Details  Name: Brittney Young MRN: [id number] Date of Birth: 08/27/1971 Referring Provider:  Gregor Hams, MD  Encounter Date: 06/15/2015      PT End of Session - 06/15/15 1549    Visit Number 8   Number of Visits 16   Date for PT Re-Evaluation 07/06/15   PT Start Time 1450   PT Stop Time 7628   PT Time Calculation (min) 67 min   Activity Tolerance Patient tolerated treatment well      Past Medical History  Diagnosis Date  . Hypertension   . Depression   . Anxiety     Past Surgical History  Procedure Laterality Date  . Cesarean section      There were no vitals filed for this visit.  Visit Diagnosis:  Pain  Decreased strength, endurance, and mobility  Stiffness in joint  Lumbar radiculopathy, acute      Subjective Assessment - 06/15/15 1505    Subjective Patient reports that she continues to have some pain in the back and hip. She is working on her exercises at home. Can tell she is making progress although symptoms are increased some today - not sure why.   Currently in Pain? Yes   Pain Score 5    Pain Location Back   Pain Orientation Left;Lower   Pain Descriptors / Indicators Burning   Pain Type Acute pain   Pain Onset More than a month ago   Pain Frequency Intermittent   Aggravating Factors  bending; changing positions/transitional movements   Pain Relieving Factors medicine; heat; TENS unit            [facility] PT Assessment - 06/15/15 0001    Assessment   Medical Diagnosis Lt sided low back pain with sciatica   Onset Date/Surgical Date 03/26/15   Hand Dominance Right   Next MD Visit 06/18/15   Strength   Left Hip Flexion 5/5   Left Hip Extension 5/5   Left Hip ABduction 5/5   Left Knee Flexion 5/5   Left Knee Extension 5/5   Left Ankle  Dorsiflexion 5/5   Left Ankle Plantar Flexion 5/5   Left Ankle Inversion 5/5   Left Ankle Eversion 5/5   Palpation   SI assessment  painful Lt SI   Palpation comment persistent tightness through piriformis, gluts; lumbar paraspinals Lt                      [facility] Adult PT Treatment/Exercise - 06/15/15 0001    Exercises   Exercises Lumbar   Lumbar Exercises: Stretches   Passive Hamstring Stretch 2 reps;30 seconds   Passive Hamstring Stretch Limitations strap   Lumbar Exercises: Aerobic   Stationary Bike Nustep L4X5'   Tread Mill 1.0 to 1.4 mph 5 min level   Lumbar Exercises: Standing   Wall Slides 10 reps;5 seconds   Scapular Retraction Strengthening;Both;20 reps;Theraband   Theraband Level (Scapular Retraction) Level 2 (Red)   Row Strengthening;Both;20 reps;Theraband   Theraband Level (Row) Level 2 (Red)   Shoulder Extension Strengthening;Both;20 reps;Theraband   Theraband Level (Shoulder Extension) Level 2 (Red)   Lumbar Exercises: Supine   Ab Set 10 reps  10 sec hold   Bent Knee Raise 10 reps;1 second;2 seconds   Dead Bug 10 reps;1 second;2 seconds   Bridge 10 reps;5 seconds  Lumbar Exercises: Prone   Other Prone Lumbar Exercises pelvic press x10   Other Prone Lumbar Exercises knee bend hip ext with pelvic press 5 reps each LE 2 sets   Lumbar Exercises: Quadruped   [place]/Old Horse 10 reps   Single Arm Raise 10 reps;1 second;2 seconds  engaging core   Straight Leg Raise 10 reps   Modalities   Modalities Electrical Stimulation;Moist Heat   Moist Heat Therapy   Number Minutes Moist Heat 15 Minutes   Moist Heat Location Lumbar Spine;Hip   Electrical Stimulation   Electrical Stimulation Location bilat L5; Lt hip   Electrical Stimulation Action IFC   Electrical Stimulation Parameters to tolerance   Electrical Stimulation Goals Pain   Manual Therapy   Manual Therapy Joint mobilization;Soft tissue mobilization   Manual therapy comments Trigger point release  through Lt glute/ piriformis    Joint Mobilization CPA L5; Lt transverse process mobs Grade II/III   Soft tissue mobilization Lt hip   Myofascial Release to Lt piriformis, gluts and lumbar paraspinals                 PT Education - 06/15/15 1548    Education provided Yes   Education Details to progress activities at home; increase walking program; added prone press and prone press with hip ext w/knee bent   Person(s) Educated Patient   Methods Explanation;Demonstration;Tactile cues;Verbal cues;Handout   Comprehension Verbalized understanding;Returned demonstration;Verbal cues required;Tactile cues required          PT Short Term Goals - 06/15/15 1554    PT SHORT TERM GOAL #1   Title I with initial HEP ( 06/13/15)    Time 3   Period Weeks   Status Achieved   PT SHORT TERM GOAL #2   Title Patient to stand upright with good alignment and equal weight bearing bilat LE's 06/13/15)   Time 3   Period Weeks   Status Achieved   PT SHORT TERM GOAL #3   Title increase Lt LE strength =/> 4/5 through out ( 06/13/15)    Time 3   Period Weeks   Status Achieved   PT SHORT TERM GOAL #4   Title Patient to tolerate sitting for 20 min without increased symptoms; standing and walking 10 min without increased symptoms 05/01/15   Time 4   Period Weeks   Status On-going           PT Long Term Goals - 06/15/15 1556    PT LONG TERM GOAL #1   Title I with advanced HEP ( 07/04/15)    Time 6   Period Weeks   Status On-going   PT LONG TERM GOAL #2   Title report pain reduction =/> 75% with daily activity ( 07/04/15)    Time 6   Period Weeks   Status On-going   PT LONG TERM GOAL #3   Title demo Lt LE strength =/> 5-/5 ( 07/04/15)    Time 6   Period Weeks   Status Achieved   PT LONG TERM GOAL #4   Title Decrease FOTO to </=  33% limitation 07/04/15   Time 6   Period Weeks   Status On-going   PT LONG TERM GOAL #5   Title lift up to 50# floor to waist without increased pain to  assist with return to work ( 07/04/15)    Time 6   Period Weeks   Status Not Met   PT LONG TERM GOAL #6   Title push/pull =/>  100# on the sled without increased pain to assist with return to work ( 07/04/15)    Time 6   Period Weeks   Status Not Met               Plan - 06/15/15 1550    Clinical Impression Statement Gradual progress continues. Patient demonstrates good gains in LE strength with strength now 5/5 throughout. She continues to have pain with L4/5 lumbar mobs and pain in Lt SI as well as into the Lt lumbar paraspinals and hip abductors. Brittney Young has pain on an intermittent basis with variable intensity. She has progressed with exercise program and exercise tolerance but is not ready to resume tasks required for her job. Brittney Young will benefit from continued Physical Therapy to address core stability and strengthening; increasing activity level  progressing to return to work tasks.    Pt will benefit from skilled therapeutic intervention in order to improve on the following deficits Pain;Postural dysfunction;Improper body mechanics;Decreased range of motion;Abnormal gait;Decreased activity tolerance;Decreased endurance;Decreased strength   Rehab Potential Good   PT Frequency 2x / week   PT Duration 6 weeks   PT Treatment/Interventions Patient/family education;ADLs/Self Care Home Management;Therapeutic exercise;Therapeutic activities;Neuromuscular re-education;Cryotherapy;Electrical Stimulation;Moist Heat;Iontophoresis 22m/ml Dexamethasone;Ultrasound;Manual techniques;Traction;Dry needling   PT Next Visit Plan responsed to extension program; progress as indicated. Prone pelvic press series   PT Home Exercise Plan core stabilization/McKenzie/prone press series   Consulted and Agree with Plan of Care Patient        Problem List Patient Active Problem List   Diagnosis Date Noted  . Greater trochanteric bursitis of left hip 06/08/2015  . Lumbago 04/02/2015  . Anxiety and  depression 12/18/2014  . Anemia 07/03/2014  . Vitamin D deficiency 07/03/2014  . Other fatigue 07/03/2014  . Essential hypertension, benign 06/30/2014  . Genital herpes 06/30/2014    Saagar Tortorella PNilda SimmerPT, MPH 06/15/2015, 3:58 PM  [facility] [address]Phone: [phone]  Fax:  [phone]

## 2015-06-18 ENCOUNTER — Ambulatory Visit (INDEPENDENT_AMBULATORY_CARE_PROVIDER_SITE_OTHER): Payer: BLUE CROSS/BLUE SHIELD | Admitting: Family Medicine

## 2015-06-18 ENCOUNTER — Encounter: Payer: Self-pay | Admitting: Family Medicine

## 2015-06-18 VITALS — BP 130/89 | HR 79 | Wt 192.0 lb

## 2015-06-18 DIAGNOSIS — M5442 Lumbago with sciatica, left side: Secondary | ICD-10-CM

## 2015-06-18 NOTE — Patient Instructions (Signed)
Thank you for coming in today. Return in 2 weeks,  Call or go to the ER if you develop a large red swollen joint with extreme pain or oozing puss.   Continue PT.

## 2015-06-18 NOTE — Progress Notes (Signed)
Brittney Young is a 44 y.o. female who presents to primary Care today for follow-up back pain. Patient has been seen multiple times for her subacute back pain with radicular pain. She's had an epidural steroid injection which has helped her radicular pain significantly. Recently you've been battling hip and low back pain. She recently had a greater trochanteric injection which helped significantly. She continues physical therapy which make slow progress. 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 She does not think that she is ready to return to work at this point yet.   No fever, chills NVD.    Past Medical History  Diagnosis Date  . Hypertension   . Depression   . Anxiety    Past Surgical History  Procedure Laterality Date  . Cesarean section     Social History  Substance Use Topics  . Smoking status: Never Smoker   . Smokeless tobacco: Not on file  . Alcohol Use: No   ROS as above Medications: Current Outpatient Prescriptions  Medication Sig Dispense Refill  . citalopram (CELEXA) 20 MG tablet Take 1 tablet (20 mg total) by mouth daily. 30 tablet 4  . hydrochlorothiazide (HYDRODIURIL) 25 MG tablet Take 1 tablet (25 mg total) by mouth daily. 30 tablet 5  . metoprolol tartrate (LOPRESSOR) 25 MG tablet Take 1 tablet (25 mg total) by mouth 2 (two) times daily. 60 tablet 5  . potassium chloride SA (K-DUR,KLOR-CON) 20 MEQ tablet Take 1 tablet (20 mEq total) by mouth daily with breakfast. 30 tablet 5   No current facility-administered medications for this visit.   No Known Allergies   Exam:  BP 130/89 mmHg  Pulse 79  Wt 192 lb (87.091 kg) Gen: Well NAD HEENT: EOMI,  MMM Lungs: Normal work of breathing. CTABL Heart: RRR no MRG Abd: NABS, Soft. Nondistended, Nontender Exts: Brisk capillary refill, warm and well perfused.  Back: Non-tender to palpation midline. Very tender palpation left SI joint. Additionally  patient has anterior hip pain with rotational motion of the hip and the left side. Hip range of motion bilaterally is normal and equal bilaterally. Normal gait. The hip is nontender at the greater trochanter.   Procedure: Real-time Ultrasound Guided Injection of left SI joint  Device: GE Logiq E  Images permanently stored and available for review in the ultrasound unit. Verbal informed consent obtained. Discussed risks and benefits of procedure. Warned about infection bleeding damage to structures skin hypopigmentation and fat atrophy among others. Patient expresses understanding and agreement Time-out conducted.  Noted no overlying erythema, induration, or other signs of local infection.  Skin prepped in a sterile fashion.  Local anesthesia: Topical Ethyl chloride.  With sterile technique and under real time ultrasound guidance: 40 mg of Kenalog and 4 mL of Marcaine injected easily.  Completed without difficulty  Pain immediately resolved suggesting accurate placement of the medication.  Advised to call if fevers/chills, erythema, induration, drainage, or persistent bleeding.  Images permanently stored and available for review in the ultrasound unit.  Impression: Technically successful ultrasound guided injection.   No results found for this or any previous visit (from the past 24 hour(s)). No results found.  Assessment and Plan: 43 y.o. female with left SI joint pain. Patient has had persistent left SI joint pain now for some time. She had a trial of physical therapy which has not helped. Plan for SI joint injection today. Continue physical therapy. Continue work restriction. Follow-up  in 2 weeks. If pain is still quite bothersome in the anterior portion of the hip at that time would consider diagnostic and therapeutic femoroacetabular injection.  Discussed warning signs or symptoms. Please see discharge instructions. Patient expresses understanding.

## 2015-06-19 ENCOUNTER — Telehealth: Payer: Self-pay

## 2015-06-19 NOTE — Telephone Encounter (Signed)
Patient called to find out how long she will be out of work. Please advise.

## 2015-06-19 NOTE — Telephone Encounter (Signed)
She will be out of work probably two more weeks. We will readdress the work issue at the next visit in 2 weeks.

## 2015-06-20 ENCOUNTER — Telehealth: Payer: Self-pay | Admitting: *Deleted

## 2015-06-20 NOTE — Telephone Encounter (Signed)
Left message on vm with Dr. Clovis Riley advice

## 2015-06-21 ENCOUNTER — Ambulatory Visit (INDEPENDENT_AMBULATORY_CARE_PROVIDER_SITE_OTHER): Payer: BLUE CROSS/BLUE SHIELD | Admitting: Physical Therapy

## 2015-06-21 DIAGNOSIS — Z7409 Other reduced mobility: Secondary | ICD-10-CM | POA: Diagnosis not present

## 2015-06-21 DIAGNOSIS — M5416 Radiculopathy, lumbar region: Secondary | ICD-10-CM

## 2015-06-21 DIAGNOSIS — M2560 Stiffness of unspecified joint, not elsewhere classified: Secondary | ICD-10-CM

## 2015-06-21 DIAGNOSIS — R6889 Other general symptoms and signs: Secondary | ICD-10-CM

## 2015-06-21 DIAGNOSIS — R52 Pain, unspecified: Secondary | ICD-10-CM

## 2015-06-21 DIAGNOSIS — R531 Weakness: Secondary | ICD-10-CM

## 2015-06-21 NOTE — Therapy (Signed)
Brittney Young [place], [place], [address] Phone: [phone]   Fax:  [phone]  Physical Therapy Treatment  Patient Details  Name: Brittney Young MRN: [id number] Date of Birth: 07/09/1971 No Data Recorded  Encounter Date: 06/21/2015      PT End of Session - 06/21/15 1615    Visit Number 9   Number of Visits 16   Date for PT Re-Evaluation 07/06/15   PT Start Time 8177   PT Stop Time 1627   PT Time Calculation (min) 57 min   Activity Tolerance Patient tolerated treatment well      Past Medical History  Diagnosis Date  . Hypertension   . Depression   . Anxiety     Past Surgical History  Procedure Laterality Date  . Cesarean section      There were no vitals filed for this visit.  Visit Diagnosis:  Pain  Decreased strength, endurance, and mobility  Stiffness in joint  Lumbar radiculopathy, acute      Subjective Assessment - 06/21/15 1616    Subjective Pt reports she received injection in Lt SI joint on Monday. Still has some discomfort.    Currently in Pain? Yes   Pain Score 3    Pain Location Back   Pain Orientation Left;Lower   Pain Descriptors / Indicators Sore   Aggravating Factors  bending, transition movments.    Pain Relieving Factors medicine, heat, TENS            [facility] PT Assessment - 06/21/15 0001    Assessment   Medical Diagnosis Lt sided low back pain with sciatica   Onset Date/Surgical Date 03/26/15   Hand Dominance Right   Next MD Visit 07/02/15             [facility] Adult PT Treatment/Exercise - 06/21/15 0001    Exercises   Exercises Lumbar;Knee/Hip   Lumbar Exercises: Stretches   Passive Hamstring Stretch 2 reps;30 seconds   Passive Hamstring Stretch Limitations strap   Press Ups 5 reps   Piriformis Stretch 2 reps;30 seconds   Piriformis Stretch Limitations each leg, supine   Lumbar Exercises: Aerobic   Stationary Bike Nustep L4 x 7 min '    Lumbar Exercises: Standing   Other Standing Lumbar Exercises Modified triangle pose with hand on black mat table (no pain) x 20 sec x 2 reps each side.    Lumbar Exercises: Prone   Other Prone Lumbar Exercises Muscle energy, Lt hip on high/low table: contract hip flexor x 5 sec, press up x 5 sec x 8 reps.    Knee/Hip Exercises: Stretches   Gastroc Stretch 2 reps;30 seconds;Right;Left   Soleus Stretch 2 reps;30 seconds;Right;Left   Modalities   Modalities Cryotherapy;Electrical Stimulation   Cryotherapy   Number Minutes Cryotherapy 15 Minutes   Cryotherapy Location Lumbar Spine   Type of Cryotherapy Ice pack   Electrical Stimulation   Electrical Stimulation Location bilat L5; Lt hip   Electrical Stimulation Action IFC   Electrical Stimulation Parameters to tolerance    Electrical Stimulation Goals Pain                  PT Short Term Goals - 06/15/15 1554    PT SHORT TERM GOAL #1   Title I with initial HEP ( 06/13/15)    Time 3   Period Weeks   Status Achieved   PT SHORT TERM GOAL #2   Title Patient to stand upright with good  alignment and equal weight bearing bilat LE's 06/13/15)   Time 3   Period Weeks   Status Achieved   PT SHORT TERM GOAL #3   Title increase Lt LE strength =/> 4/5 through out ( 06/13/15)    Time 3   Period Weeks   Status Achieved   PT SHORT TERM GOAL #4   Title Patient to tolerate sitting for 20 min without increased symptoms; standing and walking 10 min without increased symptoms 05/01/15   Time 4   Period Weeks   Status On-going           PT Long Term Goals - 06/15/15 1556    PT LONG TERM GOAL #1   Title I with advanced HEP ( 07/04/15)    Time 6   Period Weeks   Status On-going   PT LONG TERM GOAL #2   Title report pain reduction =/> 75% with daily activity ( 07/04/15)    Time 6   Period Weeks   Status On-going   PT LONG TERM GOAL #3   Title demo Lt LE strength =/> 5-/5 ( 07/04/15)    Time 6   Period Weeks   Status Achieved    PT LONG TERM GOAL #4   Title Decrease FOTO to </=  33% limitation 07/04/15   Time 6   Period Weeks   Status On-going   PT LONG TERM GOAL #5   Title lift up to 50# floor to waist without increased pain to assist with return to work ( 07/04/15)    Time 6   Period Weeks   Status Not Met   PT LONG TERM GOAL #6   Title push/pull =/> 100# on the sled without increased pain to assist with return to work ( 07/04/15)    Time 6   Period Weeks   Status Not Met               Plan - 06/21/15 1626    Clinical Impression Statement Pt tolerated new stretches with no increase in pain. Pt's Lt ASIS appeared higher on Lt side; pt felt relief with muscle energy/hip flexor stretch on left - minimal shift in ASIS height.    Pt will benefit from skilled therapeutic intervention in order to improve on the following deficits Pain;Postural dysfunction;Improper body mechanics;Decreased range of motion;Abnormal gait;Decreased activity tolerance;Decreased endurance;Decreased strength   Rehab Potential Good   PT Frequency 2x / week   PT Duration 6 weeks   PT Treatment/Interventions Patient/family education;ADLs/Self Care Home Management;Therapeutic exercise;Therapeutic activities;Neuromuscular re-education;Cryotherapy;Electrical Stimulation;Moist Heat;Iontophoresis 64m/ml Dexamethasone;Ultrasound;Manual techniques;Traction;Dry needling   PT Next Visit Plan Trial of light lifting with instruction on body mechanics.  Also check pelvis alignment/ leg length.    Consulted and Agree with Plan of Care Patient        Problem List Patient Active Problem List   Diagnosis Date Noted  . Greater trochanteric bursitis of left hip 06/08/2015  . Lumbago 04/02/2015  . Anxiety and depression 12/18/2014  . Anemia 07/03/2014  . Vitamin D deficiency 07/03/2014  . Other fatigue 07/03/2014  . Essential hypertension, benign 06/30/2014  . Genital herpes 06/30/2014    JKerin Perna PTA 06/21/2015 6:08  PM  [place] [address]Phone: [phone]  Fax:  [phone] Name: Brittney SCHEXNAYDERMRN: [id number] of Birth: 3Feb 28, 1972

## 2015-06-22 ENCOUNTER — Encounter: Payer: BLUE CROSS/BLUE SHIELD | Admitting: Rehabilitative and Restorative Service Providers"

## 2015-06-22 NOTE — Telephone Encounter (Signed)
clsed

## 2015-06-25 ENCOUNTER — Ambulatory Visit (INDEPENDENT_AMBULATORY_CARE_PROVIDER_SITE_OTHER): Payer: BLUE CROSS/BLUE SHIELD | Admitting: Physical Therapy

## 2015-06-25 DIAGNOSIS — M2560 Stiffness of unspecified joint, not elsewhere classified: Secondary | ICD-10-CM | POA: Diagnosis not present

## 2015-06-25 DIAGNOSIS — R52 Pain, unspecified: Secondary | ICD-10-CM | POA: Diagnosis not present

## 2015-06-25 DIAGNOSIS — R531 Weakness: Secondary | ICD-10-CM

## 2015-06-25 DIAGNOSIS — Z7409 Other reduced mobility: Secondary | ICD-10-CM | POA: Diagnosis not present

## 2015-06-25 DIAGNOSIS — R6889 Other general symptoms and signs: Secondary | ICD-10-CM

## 2015-06-25 DIAGNOSIS — M5416 Radiculopathy, lumbar region: Secondary | ICD-10-CM | POA: Diagnosis not present

## 2015-06-25 NOTE — Therapy (Signed)
[place], [place], [address] Phone: [phone]   Fax:  [phone]  Physical Therapy Treatment  Patient Details  Name: Brittney Young MRN: [id number] Date of Birth: [birth year] No Data Recorded  Encounter Date: 06/25/2015      PT End of Session - 06/25/15 1520    PT Start Time 1430   PT Stop Time 1525   PT Time Calculation (min) 55 min   Activity Tolerance Patient limited by pain      Past Medical History  Diagnosis Date  . Hypertension   . Depression   . Anxiety     Past Surgical History  Procedure Laterality Date  . Cesarean section      There were no vitals filed for this visit.  Visit Diagnosis:  Pain  Decreased strength, endurance, and mobility  Stiffness in joint  Lumbar radiculopathy, acute      Subjective Assessment - 06/25/15 1435    Subjective "My back was burning pretty bad this weekend".  Pt reported that she had carried some groceries in house, as well swept house.     Currently in Pain? Yes   Pain Score 4    Pain Location Back   Pain Orientation Left;Lower   Pain Descriptors / Indicators Sore   Aggravating Factors  bending, transition movements   Pain Relieving Factors medicine, heat, TENS            [facility] PT Assessment - 06/25/15 0001    Assessment   Medical Diagnosis Lt sided low back pain with sciatica   Onset Date/Surgical Date 03/26/15   Hand Dominance Right   Next MD Visit 07/02/15   Special Tests   Leg length test  True   True   Left  34 in.   Right 33.75         [facility] Adult PT Treatment/Exercise - 06/25/15 0001    Lumbar Exercises: Stretches   Passive Hamstring Stretch 2 reps;60 seconds  supine with strap    ITB Stretch 2 reps;30 seconds  with strap    Piriformis Stretch 2 reps;30 seconds   Piriformis Stretch Limitations each leg, supine   Lumbar Exercises: Aerobic   Stationary Bike NuStep L5: 5 min    Lumbar Exercises:  Standing   Wall Slides 10 reps;5 seconds   Wall Slides Limitations corrections to form.    Other Standing Lumbar Exercises 2-5# lifts from over head to/from knee level with VC for proper body mechanics x 10 reps.    Lumbar Exercises: Prone   Other Prone Lumbar Exercises Muscle energy, Lt hip on high/low table: contract hip flexor x 5 sec, press up x 5 sec x 8 reps.    Modalities   Modalities Cryotherapy;Electrical Stimulation   Cryotherapy   Number Minutes Cryotherapy 15 Minutes  pt in prone   Cryotherapy Location Lumbar Spine   Type of Cryotherapy Ice pack   Electrical Stimulation   Electrical Stimulation Location bilat L5; Lt hip   Electrical Stimulation Action IFC   Electrical Stimulation Parameters to tolerance    Electrical Stimulation Goals Pain                PT Education - 06/25/15 1700    Education provided Yes   Education Details regarding heel lift trial.    Person(s) Educated Patient   Methods Explanation   Comprehension Verbalized understanding          PT Short Term Goals - 06/15/15 1554  PT SHORT TERM GOAL #1   Title I with initial HEP ( 06/13/15)    Time 3   Period Weeks   Status Achieved   PT SHORT TERM GOAL #2   Title Patient to stand upright with good alignment and equal weight bearing bilat LE's 06/13/15)   Time 3   Period Weeks   Status Achieved   PT SHORT TERM GOAL #3   Title increase Lt LE strength =/> 4/5 through out ( 06/13/15)    Time 3   Period Weeks   Status Achieved   PT SHORT TERM GOAL #4   Title Patient to tolerate sitting for 20 min without increased symptoms; standing and walking 10 min without increased symptoms 05/01/15   Time 4   Period Weeks   Status On-going           PT Long Term Goals - 06/15/15 1556    PT LONG TERM GOAL #1   Title I with advanced HEP ( 07/04/15)    Time 6   Period Weeks   Status On-going   PT LONG TERM GOAL #2   Title report pain reduction =/> 75% with daily activity ( 07/04/15)     Time 6   Period Weeks   Status On-going   PT LONG TERM GOAL #3   Title demo Lt LE strength =/> 5-/5 ( 07/04/15)    Time 6   Period Weeks   Status Achieved   PT LONG TERM GOAL #4   Title Decrease FOTO to </=  33% limitation 07/04/15   Time 6   Period Weeks   Status On-going   PT LONG TERM GOAL #5   Title lift up to 50# floor to waist without increased pain to assist with return to work ( 07/04/15)    Time 6   Period Weeks   Status Not Met   PT LONG TERM GOAL #6   Title push/pull =/> 100# on the sled without increased pain to assist with return to work ( 07/04/15)    Time 6   Period Weeks   Status Not Met               Plan - 06/25/15 1520    Clinical Impression Statement Pt required some VC for improved body mechanics with lifting exercises and wall squats.  Noted .25" Lt leg length difference when measured ASIS to medial malleoli; will trial heel lift.    Pt will benefit from skilled therapeutic intervention in order to improve on the following deficits Pain;Postural dysfunction;Improper body mechanics;Decreased range of motion;Abnormal gait;Decreased activity tolerance;Decreased endurance;Decreased strength   Rehab Potential Good   PT Frequency 2x / week   PT Duration 6 weeks   PT Treatment/Interventions Patient/family education;ADLs/Self Care Home Management;Therapeutic exercise;Therapeutic activities;Neuromuscular re-education;Cryotherapy;Electrical Stimulation;Moist Heat;Iontophoresis 4mg /ml Dexamethasone;Ultrasound;Manual techniques;Traction;Dry needling   PT Next Visit Plan Assess response to heel lift.  Continue core/ LE strengthening. Check pelvis alignment.    PT Home Exercise Plan core stabilization/McKenzie/prone press series   Consulted and Agree with Plan of Care Patient        Problem List Patient Active Problem List   Diagnosis Date Noted  . Greater trochanteric bursitis of left hip 06/08/2015  . Lumbago 04/02/2015  . Anxiety and depression  12/18/2014  . Anemia 07/03/2014  . Vitamin D deficiency 07/03/2014  . Other fatigue 07/03/2014  . Essential hypertension, benign 06/30/2014  . Genital herpes 06/30/2014    Kerin Perna, PTA 06/25/2015 5:01 PM  [facility] [place], [place], [address] Phone: [phone]   Fax:  [phone]  Name: Brittney Young MRN: [id number] Date of Birth: May 15, 1971

## 2015-06-28 ENCOUNTER — Ambulatory Visit (INDEPENDENT_AMBULATORY_CARE_PROVIDER_SITE_OTHER): Payer: BLUE CROSS/BLUE SHIELD | Admitting: Physical Therapy

## 2015-06-28 DIAGNOSIS — M2560 Stiffness of unspecified joint, not elsewhere classified: Secondary | ICD-10-CM

## 2015-06-28 DIAGNOSIS — M5416 Radiculopathy, lumbar region: Secondary | ICD-10-CM | POA: Diagnosis not present

## 2015-06-28 DIAGNOSIS — Z7409 Other reduced mobility: Secondary | ICD-10-CM | POA: Diagnosis not present

## 2015-06-28 DIAGNOSIS — R531 Weakness: Secondary | ICD-10-CM

## 2015-06-28 DIAGNOSIS — R52 Pain, unspecified: Secondary | ICD-10-CM | POA: Diagnosis not present

## 2015-06-28 DIAGNOSIS — R6889 Other general symptoms and signs: Secondary | ICD-10-CM

## 2015-06-28 NOTE — Therapy (Addendum)
[place]  [place], [place], [address] Phone: [phone]   Fax:  [phone]  Physical Therapy Treatment  Patient Details  Name: Brittney Young MRN: [id number] Date of Birth: 06-16-1971 No Data Recorded  Encounter Date: 06/28/2015      PT End of Session - 06/28/15 1551    Visit Number 10   Number of Visits 16   Date for PT Re-Evaluation 07/06/15   PT Start Time 0240   PT Stop Time 9735   PT Time Calculation (min) 51 min      Past Medical History  Diagnosis Date  . Hypertension   . Depression   . Anxiety     Past Surgical History  Procedure Laterality Date  . Cesarean section      There were no vitals filed for this visit.  Visit Diagnosis:  Pain  Decreased strength, endurance, and mobility  Stiffness in joint  Lumbar radiculopathy, acute      Subjective Assessment - 06/28/15 1552    Subjective Pt reported she was very sore after last appointment, feels it's still from weekend.  Hasn't noticed any difference since placement of wedge.    Currently in Pain? Yes   Pain Score 5    Pain Location Back   Pain Orientation Left   Pain Descriptors / Indicators Burning   Aggravating Factors  bending, transition movements   Pain Relieving Factors medicine, ice             [facility] PT Assessment - 06/28/15 0001    Assessment   Medical Diagnosis Lt sided low back pain with sciatica   Onset Date/Surgical Date 03/26/15   Hand Dominance Right   Next MD Visit 07/02/15            [facility] Adult PT Treatment/Exercise - 06/28/15 0001    Lumbar Exercises: Stretches   Passive Hamstring Stretch 2 reps;60 seconds  supine with strap    Piriformis Stretch 2 reps;30 seconds   Lumbar Exercises: Aerobic   Stationary Bike NuStep L4: 7 min    Lumbar Exercises: Sidelying   Other Sidelying Lumbar Exercises sidelying stretch (on Rt) 1 min over green bolster x 2 reps    Knee/Hip Exercises:  Stretches   Active Hamstring Stretch Right   Quad Stretch 2 reps;Left;30 seconds;Right   Modalities   Modalities Cryotherapy;Electrical Stimulation   Cryotherapy   Number Minutes Cryotherapy 15 Minutes  in prone   Cryotherapy Location Lumbar Spine   Type of Cryotherapy Ice pack   Electrical Stimulation   Electrical Stimulation Location bilat SI joint and lumbar paraspinals.    Electrical Stimulation Action IFC    Electrical Stimulation Parameters to tolerance x 15 min    Electrical Stimulation Goals Pain   Manual Therapy   Manual Therapy Soft tissue mobilization   Soft tissue mobilization soft tissue mobilization with Edge tool assistance to decrease adhesions and pain - applied to bilateral lumbar paraspinals and along SI joint;   soft tissue mobilization to bilat glutes.                   PT Short Term Goals - 06/15/15 1554    PT SHORT TERM GOAL #1   Title I with initial HEP ( 06/13/15)    Time 3   Period Weeks   Status Achieved   PT SHORT TERM GOAL #2   Title Patient to stand upright with good alignment and equal weight bearing bilat LE's 06/13/15)  Time 3   Period Weeks   Status Achieved   PT SHORT TERM GOAL #3   Title increase Lt LE strength =/> 4/5 through out ( 06/13/15)    Time 3   Period Weeks   Status Achieved   PT SHORT TERM GOAL #4   Title Patient to tolerate sitting for 20 min without increased symptoms; standing and walking 10 min without increased symptoms 05/01/15   Time 4   Period Weeks   Status On-going           PT Long Term Goals - 06/15/15 1556    PT LONG TERM GOAL #1   Title I with advanced HEP ( 07/04/15)    Time 6   Period Weeks   Status On-going   PT LONG TERM GOAL #2   Title report pain reduction =/> 75% with daily activity ( 07/04/15)    Time 6   Period Weeks   Status On-going   PT LONG TERM GOAL #3   Title demo Lt LE strength =/> 5-/5 ( 07/04/15)    Time 6   Period Weeks   Status Achieved   PT LONG TERM GOAL #4    Title Decrease FOTO to </=  33% limitation 07/04/15   Time 6   Period Weeks   Status On-going   PT LONG TERM GOAL #5   Title lift up to 50# floor to waist without increased pain to assist with return to work ( 07/04/15)    Time 6   Period Weeks   Status Not Met   PT LONG TERM GOAL #6   Title push/pull =/> 100# on the sled without increased pain to assist with return to work ( 07/04/15)    Time 6   Period Weeks   Status Not Met               Plan - 06/28/15 1619    Clinical Impression Statement Pt has had a set back with progress since past wkend.  Appears to have slight leg length difference; small heel wedge did not make a difference. Pt reported slight reduction in Lt back/ LE pain with Rt sidelying stretch.  Pt reported elimination of pain by end of session.  Slow progress towards remaining goals.    Pt will benefit from skilled therapeutic intervention in order to improve on the following deficits Pain;Postural dysfunction;Improper body mechanics;Decreased range of motion;Abnormal gait;Decreased activity tolerance;Decreased endurance;Decreased strength   Rehab Potential Good   PT Frequency 2x / week   PT Duration 6 weeks   PT Treatment/Interventions Patient/family education;ADLs/Self Care Home Management;Therapeutic exercise;Therapeutic activities;Neuromuscular re-education;Cryotherapy;Electrical Stimulation;Moist Heat;Iontophoresis 70m/ml Dexamethasone;Ultrasound;Manual techniques;Traction;Dry needling   PT Home Exercise Plan hold therapy until MD visit; await further instruction.    Consulted and Agree with Plan of Care Patient        Problem List Patient Active Problem List   Diagnosis Date Noted  . Greater trochanteric bursitis of left hip 06/08/2015  . Lumbago 04/02/2015  . Anxiety and depression 12/18/2014  . Anemia 07/03/2014  . Vitamin D deficiency 07/03/2014  . Other fatigue 07/03/2014  . Essential hypertension, benign 06/30/2014  . Genital herpes  06/30/2014    CShelbie Hutching10/20/2016, 6:18 PM  [facility] [address]Phone: [phone]  Fax:  [phone] Name: KMASIE BERMINGHAMMRN: [id number] of Birth: [birth year]   PHYSICAL THERAPY DISCHARGE SUMMARY  Visits from Start of Care: 10  Current functional level related to goals / functional outcomes: Initial improvement with treatment but experienced increased symptoms with any increase in activity level.   Remaining deficits: Pain and radicular symptoms   Education / Equipment: HEP  Plan: Patient agrees to discharge.  Patient goals were not met. Patient is being discharged due to financial reasons.  ?????    Celyn P. Helene Kelp PT, MPH 08/06/2015 9:05 AM

## 2015-07-02 ENCOUNTER — Ambulatory Visit (INDEPENDENT_AMBULATORY_CARE_PROVIDER_SITE_OTHER): Payer: BLUE CROSS/BLUE SHIELD | Admitting: Family Medicine

## 2015-07-02 ENCOUNTER — Encounter: Payer: Self-pay | Admitting: Family Medicine

## 2015-07-02 VITALS — BP 123/85 | HR 72 | Wt 196.0 lb

## 2015-07-02 DIAGNOSIS — M545 Low back pain, unspecified: Secondary | ICD-10-CM

## 2015-07-02 NOTE — Assessment & Plan Note (Signed)
At this point I'm unclear of the location of her pain generator. She said SI injections which helped a little and greater trochanteric injections which helped a little. She does have facet hypertrophy on MRI. We'll proceed with multilevel facet diagnostic and therapeutic injections. At that point if not much better would consider second opinion with neurosurgery. Recommend continue physical therapy if she feels it is helpful. Out of work for 1 more month. Recheck following facet injections.

## 2015-07-02 NOTE — Progress Notes (Signed)
Brittney Young is a 44 y.o. female who presents to [facility]  today for follow-up back pain. Patient was seen 2 weeks ago for continued back pain. In the interim she has tried some light housework in preparation for return to work. She was unable to completely do housework and has worsening lumbar pain. She notes some mild pain radiating to the anterior thigh consistent with her previous episodes of sciatica or lumbar radiculopathy. She continues to physical therapy but feels as though she is plateauing. She denies any new weakness or numbness bowel bladder dysfunction fevers or chills.   Past Medical History  Diagnosis Date  . Hypertension   . Depression   . Anxiety    Past Surgical History  Procedure Laterality Date  . Cesarean section     Social History  Substance Use Topics  . Smoking status: Never Smoker   . Smokeless tobacco: Not on file  . Alcohol Use: No   family history includes Cancer in her sister; Stroke in her mother.  ROS as above Medications: Current Outpatient Prescriptions  Medication Sig Dispense Refill  . citalopram (CELEXA) 20 MG tablet Take 1 tablet (20 mg total) by mouth daily. 30 tablet 4  . hydrochlorothiazide (HYDRODIURIL) 25 MG tablet Take 1 tablet (25 mg total) by mouth daily. 30 tablet 5  . metoprolol tartrate (LOPRESSOR) 25 MG tablet Take 1 tablet (25 mg total) by mouth 2 (two) times daily. 60 tablet 5  . potassium chloride SA (K-DUR,KLOR-CON) 20 MEQ tablet Take 1 tablet (20 mEq total) by mouth daily with breakfast. 30 tablet 5   No current facility-administered medications for this visit.   No Known Allergies   Exam:  BP 123/85 mmHg  Pulse 72  Wt 196 lb (88.905 kg) Gen: Well NAD Back is tender. Decreased lumbar range of motion. Antalgic gait. Exts: Brisk capillary refill, warm and well perfused.   MRI lumbar spine reviewed.  No results found for this or any previous visit (from the past 24  hour(s)). No results found.   Please see individual assessment and plan sections.

## 2015-07-02 NOTE — Patient Instructions (Signed)
Thank you for coming in today. Return following injection.  Expect to hear a phone call about the injection. Let me know by Wednesday if you do not hear anything.  Come back or go to the emergency room if you notice new weakness new numbness problems walking or bowel or bladder problems.

## 2015-07-03 ENCOUNTER — Encounter: Payer: Self-pay | Admitting: Family Medicine

## 2015-07-09 ENCOUNTER — Encounter: Payer: BLUE CROSS/BLUE SHIELD | Admitting: Physical Therapy

## 2015-07-11 ENCOUNTER — Encounter: Payer: BLUE CROSS/BLUE SHIELD | Admitting: Physical Therapy

## 2015-07-12 ENCOUNTER — Ambulatory Visit (INDEPENDENT_AMBULATORY_CARE_PROVIDER_SITE_OTHER): Payer: BLUE CROSS/BLUE SHIELD | Admitting: Rehabilitative and Restorative Service Providers"

## 2015-07-12 DIAGNOSIS — R531 Weakness: Secondary | ICD-10-CM

## 2015-07-12 DIAGNOSIS — M5416 Radiculopathy, lumbar region: Secondary | ICD-10-CM

## 2015-07-12 DIAGNOSIS — R52 Pain, unspecified: Secondary | ICD-10-CM

## 2015-07-12 DIAGNOSIS — Z7409 Other reduced mobility: Secondary | ICD-10-CM

## 2015-07-12 DIAGNOSIS — M2560 Stiffness of unspecified joint, not elsewhere classified: Secondary | ICD-10-CM

## 2015-07-12 DIAGNOSIS — R6889 Other general symptoms and signs: Secondary | ICD-10-CM

## 2015-07-12 NOTE — Therapy (Addendum)
[place], [place], [address] Phone: [phone]   Fax:  [phone]  Physical Therapy Treatment  Patient Details  Name: Brittney Young MRN: [id number] Date of Birth: [birth year] No Data Recorded  Encounter Date: 07/12/2015      PT End of Session - 07/12/15 1447    Visit Number No visit today      Past Medical History  Diagnosis Date  . Hypertension   . Depression   . Anxiety     Past Surgical History  Procedure Laterality Date  . Cesarean section      There were no vitals filed for this visit.  Visit Diagnosis:  Pain  Decreased strength, endurance, and mobility  Stiffness in joint  Lumbar radiculopathy, acute                                 PT Short Term Goals - 06/15/15 1554    PT SHORT TERM GOAL #1   Title I with initial HEP ( 06/13/15)    Time 3   Period Weeks   Status Achieved   PT SHORT TERM GOAL #2   Title Patient to stand upright with good alignment and equal weight bearing bilat LE's 06/13/15)   Time 3   Period Weeks   Status Achieved   PT SHORT TERM GOAL #3   Title increase Lt LE strength =/> 4/5 through out ( 06/13/15)    Time 3   Period Weeks   Status Achieved   PT SHORT TERM GOAL #4   Title Patient to tolerate sitting for 20 min without increased symptoms; standing and walking 10 min without increased symptoms 05/01/15   Time 4   Period Weeks   Status On-going           PT Long Term Goals - 06/15/15 1556    PT LONG TERM GOAL #1   Title I with advanced HEP ( 07/04/15)    Time 6   Period Weeks   Status On-going   PT LONG TERM GOAL #2   Title report pain reduction =/> 75% with daily activity ( 07/04/15)    Time 6   Period Weeks   Status On-going   PT LONG TERM GOAL #3   Title demo Lt LE strength =/> 5-/5 ( 07/04/15)    Time 6   Period Weeks   Status Achieved   PT LONG TERM GOAL #4   Title Decrease FOTO to </=   33% limitation 07/04/15   Time 6   Period Weeks   Status On-going   PT LONG TERM GOAL #5   Title lift up to 50# floor to waist without increased pain to assist with return to work ( 07/04/15)    Time 6   Period Weeks   Status Not Met   PT LONG TERM GOAL #6   Title push/pull =/> 100# on the sled without increased pain to assist with return to work ( 07/04/15)    Time 6   Period Weeks   Status Not Met               Plan - 07/12/15 1507    Clinical Impression Statement Pt states that she has had significant increase in symptoms in LB and into Lt groin and anterior thigh for the past two weeks. She saw Dr. Georgina Young and is scheduled for facet injections tomorrow 07/13/15. Discussed  progress with patient. Elected  to cancel appointment for today and see her as indicated following Dr. Clovis Young injection/evaluation. Pt states that she no longer has coverage for PT visits and is being billed for PT. She is concerned about her finances since she is out of work. We will reschedule as indicated.    Pt will benefit from skilled therapeutic intervention in order to improve on the following deficits Pain;Postural dysfunction;Improper body mechanics;Decreased range of motion;Abnormal gait;Decreased activity tolerance;Decreased endurance;Decreased strength        Problem List Patient Active Problem List   Diagnosis Date Noted  . Greater trochanteric bursitis of left hip 06/08/2015  . Lumbago 04/02/2015  . Anxiety and depression 12/18/2014  . Anemia 07/03/2014  . Vitamin D deficiency 07/03/2014  . Other fatigue 07/03/2014  . Essential hypertension, benign 06/30/2014  . Genital herpes 06/30/2014    Brittney Young PT, MPH 07/12/2015, 3:14 PM  [facility] [place], [place], [address] Phone: [phone]   Fax:  [phone]  Name: Brittney Young MRN: [id number] Date of Birth: 1971-04-30    PHYSICAL THERAPY  DISCHARGE SUMMARY  Visits from Start of Care: 10  Current functional level related to goals / functional outcomes: Unknown, patient had increased pain at last session.    Remaining deficits: As at initial eval   Education / Equipment:initial HEP Plan: Patient agrees to discharge.  Patient goals were partially met. Patient is being discharged due to financial reasons.  Patient reports she doesn't have PT benefits anymore and is going to follow up with MD for injections. ?????        Brittney Young, PT 08/01/2015 7:35 AM

## 2015-07-13 ENCOUNTER — Ambulatory Visit
Admission: RE | Admit: 2015-07-13 | Discharge: 2015-07-13 | Disposition: A | Discharge status: Home / Self Care | Payer: BLUE CROSS/BLUE SHIELD | Source: Ambulatory Visit | Attending: Family Medicine | Admitting: Family Medicine

## 2015-07-13 DIAGNOSIS — M545 Low back pain, unspecified: Secondary | ICD-10-CM

## 2015-07-13 MED ORDER — IOHEXOL 180 MG/ML  SOLN
1.0000 mL | Freq: Once | INTRAMUSCULAR | Status: DC | PRN
  Administered 2015-07-13: 1 mL via INTRA_ARTICULAR

## 2015-07-13 MED ORDER — METHYLPREDNISOLONE ACETATE 40 MG/ML INJ SUSP (RADIOLOG
120.0000 mg | Freq: Once | INTRAMUSCULAR | Status: AC
  Administered 2015-07-13: 120 mg via INTRA_ARTICULAR

## 2015-07-13 NOTE — Discharge Instructions (Signed)

## 2015-07-18 ENCOUNTER — Encounter: Payer: Self-pay | Admitting: Family Medicine

## 2015-07-18 ENCOUNTER — Ambulatory Visit (INDEPENDENT_AMBULATORY_CARE_PROVIDER_SITE_OTHER): Payer: BLUE CROSS/BLUE SHIELD | Admitting: Family Medicine

## 2015-07-18 VITALS — BP 118/72 | HR 79 | Wt 196.0 lb

## 2015-07-18 DIAGNOSIS — M545 Low back pain, unspecified: Secondary | ICD-10-CM

## 2015-07-18 DIAGNOSIS — Z23 Encounter for immunization: Secondary | ICD-10-CM

## 2015-07-18 NOTE — Patient Instructions (Signed)
Thank you for coming in today. Return to work on the 21st. Let me know how you are doing.

## 2015-07-18 NOTE — Assessment & Plan Note (Signed)
Low back pain. The pain generator seemed to be the left L5 or S1 facet joints. Return to work on the 21st. Call if worsening. At that point may consider radiofrequency ablation to the L5-S1 or L4-L5 left facet joints.

## 2015-07-18 NOTE — Progress Notes (Signed)
Brittney Young is a 44 y.o. female who presents to [facility]: [facility]  today for follow-up back pain. Patient was last seen 2 weeks ago for back pain. At that time she had a 6 level facet injection. She notes that she's feeling a lot better. She's not 100% better but is almost all the way better. She notes the majority of her pain seemed to occur with the left lower facet injection. She notes some continued anterior hip pain here and there. She is able to do some light housework. She is scheduled to return to work on November 21 and feels that she is able to.   Past Medical History  Diagnosis Date  . Hypertension   . Depression   . Anxiety    Past Surgical History  Procedure Laterality Date  . Cesarean section     Social History  Substance Use Topics  . Smoking status: Never Smoker   . Smokeless tobacco: Not on file  . Alcohol Use: No   family history includes Cancer in her sister; Stroke in her mother.  ROS as above Medications: Current Outpatient Prescriptions  Medication Sig Dispense Refill  . citalopram (CELEXA) 20 MG tablet Take 1 tablet (20 mg total) by mouth daily. 30 tablet 4  . hydrochlorothiazide (HYDRODIURIL) 25 MG tablet Take 1 tablet (25 mg total) by mouth daily. 30 tablet 5  . metoprolol tartrate (LOPRESSOR) 25 MG tablet Take 1 tablet (25 mg total) by mouth 2 (two) times daily. 60 tablet 5  . potassium chloride SA (K-DUR,KLOR-CON) 20 MEQ tablet Take 1 tablet (20 mEq total) by mouth daily with breakfast. 30 tablet 5   No current facility-administered medications for this visit.   No Known Allergies   Exam:  BP 118/72 mmHg  Pulse 79  Wt 196 lb (88.905 kg)  LMP 07/06/2015 Gen: Well NAD  Back: Nontender normal flexion and extension rotation and lateral flexion. Normal gait. Lower extremity strength is intact.  No results found for this or any previous visit (from the past 24 hour(s)). No results found.   Please see  individual assessment and plan sections.

## 2015-07-27 ENCOUNTER — Encounter: Payer: Self-pay | Admitting: Family Medicine

## 2015-07-27 ENCOUNTER — Ambulatory Visit (INDEPENDENT_AMBULATORY_CARE_PROVIDER_SITE_OTHER): Payer: BLUE CROSS/BLUE SHIELD | Admitting: Family Medicine

## 2015-07-27 VITALS — BP 156/93 | HR 82 | Wt 197.0 lb

## 2015-07-27 DIAGNOSIS — G8929 Other chronic pain: Secondary | ICD-10-CM

## 2015-07-27 DIAGNOSIS — M5442 Lumbago with sciatica, left side: Secondary | ICD-10-CM

## 2015-07-27 MED ORDER — TRAMADOL HCL 50 MG PO TABS: 50.0000 mg | ORAL_TABLET | Freq: Three times a day (TID) | ORAL | Status: DC | PRN

## 2015-07-27 NOTE — Progress Notes (Signed)
Brittney Young is a 44 y.o. female who presents to [facility]  today for back pain. Patient has ongoing back pain with some radicular component for months. She had an MRI in August which showed L3 for left disc protrusion and foraminal stenosis with disc fragmentation and facet hypertrophy bilaterally at L4-5 and L5-S1. She had an epidural steroid injection in August 18 which improved the radicular component. She had multilevel facet injections in early November which helped. 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 However over the weekend she was doing laundry and had worsening back pain with some radiating pain to the left anterior thigh. She's not sure she is able to return to work at this time. Certainly she notes that her pain is worse with work related activities. She is quite distraught about the degree of her current disability. She's been out of work for months now.   Past Medical History  Diagnosis Date  . Hypertension   . Depression   . Anxiety    Past Surgical History  Procedure Laterality Date  . Cesarean section     Social History  Substance Use Topics  . Smoking status: Never Smoker   . Smokeless tobacco: Not on file  . Alcohol Use: No   family history includes Cancer in her sister; Stroke in her mother.  ROS as above Medications: Current Outpatient Prescriptions  Medication Sig Dispense Refill  . citalopram (CELEXA) 20 MG tablet Take 1 tablet (20 mg total) by mouth daily. 30 tablet 4  . hydrochlorothiazide (HYDRODIURIL) 25 MG tablet Take 1 tablet (25 mg total) by mouth daily. 30 tablet 5  . metoprolol tartrate (LOPRESSOR) 25 MG tablet Take 1 tablet (25 mg total) by mouth 2 (two) times daily. 60 tablet 5  . potassium chloride SA (K-DUR,KLOR-CON) 20 MEQ tablet Take 1 tablet (20 mEq total) by mouth daily with breakfast. 30 tablet 5  . traMADol (ULTRAM) 50 MG tablet Take 1 tablet (50 mg total)  by mouth every 8 (eight) hours as needed. 30 tablet 0   No current facility-administered medications for this visit.   No Known Allergies   Exam:  BP 156/93 mmHg  Pulse 82  Wt 197 lb (89.359 kg)  LMP 07/06/2015 Gen: Well NAD  Back: Tender to palpation left lower spine. Decreased range of motion due to pain. Lower extremity strength sensation reflexes are equal and normal bilaterally. Normal gait.  No results found for this or any previous visit (from the past 24 hour(s)). No results found.   Please see individual assessment and plan sections.

## 2015-07-27 NOTE — Assessment & Plan Note (Addendum)
She continues to have lumbar pain with a radicular component. This is been ongoing for quite some time now. At this point she has several other potential therapeutic options including repeat lumbar epidural steroid injection, and potential radiofrequency ablation of her left L5-S1, and L4-L5 facets. However she certainly has some significant abnormalities on her MRI. I think it is reasonable to proceed with a consultation with neurosurgery to see if there is any surgical solution for her pain. I have written a work note until December 21. She has an appointment with Dr. Ronnald Ramp at [facility] neurosurgery on November 29. Appreciate his recommendations.

## 2015-07-27 NOTE — Patient Instructions (Addendum)
Thank you for coming in today. Come back or go to the emergency room if you notice new weakness new numbness problems walking or bowel or bladder problems. Use tramadol for severe pain.  We will try to get an appointment with the neurosurgeons.  Return as needed.    It should be on November 29th at 1:00pm   I have extended the work note. Return before Dec 21st if Dr. Ronnald Ramp is not planning on further procedures.

## 2015-08-27 ENCOUNTER — Ambulatory Visit: Payer: BLUE CROSS/BLUE SHIELD | Admitting: Family Medicine

## 2015-08-31 ENCOUNTER — Ambulatory Visit: Payer: BLUE CROSS/BLUE SHIELD | Admitting: Family Medicine

## 2015-09-04 ENCOUNTER — Encounter: Payer: Self-pay | Admitting: Family Medicine

## 2015-09-04 ENCOUNTER — Ambulatory Visit (INDEPENDENT_AMBULATORY_CARE_PROVIDER_SITE_OTHER): Payer: BLUE CROSS/BLUE SHIELD

## 2015-09-04 ENCOUNTER — Ambulatory Visit (INDEPENDENT_AMBULATORY_CARE_PROVIDER_SITE_OTHER): Payer: BLUE CROSS/BLUE SHIELD | Admitting: Family Medicine

## 2015-09-04 ENCOUNTER — Ambulatory Visit: Payer: BLUE CROSS/BLUE SHIELD | Admitting: Physician Assistant

## 2015-09-04 VITALS — BP 122/86 | HR 85 | Wt 199.0 lb

## 2015-09-04 DIAGNOSIS — M25562 Pain in left knee: Secondary | ICD-10-CM

## 2015-09-04 DIAGNOSIS — M25552 Pain in left hip: Secondary | ICD-10-CM

## 2015-09-04 NOTE — Assessment & Plan Note (Signed)
Knee and hip pain are more likely to be radicular from L-spine then due to DJD or intrinsic cost of the joint as she does not have much change on x-ray. We'll defer to neurosurgery's recommendations. If not improving or thought not to be due to neurosurgical problems would consider a diagnostic and therapeutic left hip injection.

## 2015-09-04 NOTE — Progress Notes (Signed)
Brittney Young is a 44 y.o. female who presents to [facility] today for follow-up back pain discuss hip and knee pain.  1) back pain: Patient has had several month history of back pain. She exhausted conservative measures in November and was referred to neurosurgery. In the interval she had a back surgery and is feeling better. She is a follow-up appointment on January 3. She has her back is feeling better however she notes hip and knee pain.  2) hip pain: Patient has left anterior hip pain worse with standing and sitting. She has pain radiates to the anterior thigh. Pain is worse with activity. No new weakness or numbness. No injury.  3) left knee pain: Ongoing now for months without any specific injury. Pain is becoming more noticeable since her back pain is improved. Pain is worse activity. No injury.   Past Medical History  Diagnosis Date  . Hypertension   . Depression   . Anxiety    Past Surgical History  Procedure Laterality Date  . Cesarean section     Social History  Substance Use Topics  . Smoking status: Never Smoker   . Smokeless tobacco: Not on file  . Alcohol Use: No   family history includes Cancer in her sister; Stroke in her mother.  ROS as above Medications: Current Outpatient Prescriptions  Medication Sig Dispense Refill  . citalopram (CELEXA) 20 MG tablet Take 1 tablet (20 mg total) by mouth daily. 30 tablet 4  . hydrochlorothiazide (HYDRODIURIL) 25 MG tablet Take 1 tablet (25 mg total) by mouth daily. 30 tablet 5  . metoprolol tartrate (LOPRESSOR) 25 MG tablet Take 1 tablet (25 mg total) by mouth 2 (two) times daily. 60 tablet 5  . potassium chloride SA (K-DUR,KLOR-CON) 20 MEQ tablet Take 1 tablet (20 mEq total) by mouth daily with breakfast. 30 tablet 5   No current facility-administered medications for this visit.   No Known  Allergies   Exam:  BP 122/86 mmHg  Pulse 85  Wt 199 lb (90.266 kg)  LMP 08/13/2015 (Exact Date) Gen: Well NAD HEENT: EOMI,  MMM Lungs: Normal work of breathing. CTABL Heart: RRR no MRG Abd: NABS, Soft. Nondistended, Nontender Exts: Brisk capillary refill, warm and well perfused.  MSK: Hip is nontender. Normal hip range of motion but pain with rotation. Left knee normal-appearing nontender normal motion.   No results found for this or any previous visit (from the past 24 hour(s)). Dg Knee 1-2 Views Right  09/04/2015  CLINICAL DATA:  Left hip and knee pain since back surgery in July. No known injury. EXAM: RIGHT KNEE - 1-2 VIEW COMPARISON:  None. FINDINGS: Limited views of the right knee demonstrate normal joint spaces. No acute bony abnormality. Specifically, no fracture, subluxation, or dislocation. Soft tissues are intact. IMPRESSION: No acute bony abnormality. Electronically Signed   By: Rolm Baptise M.D.   On: 09/04/2015 15:53   Dg Knee Complete 4 Views Left  09/04/2015  CLINICAL DATA:  Left knee pain since back surgery in July. No known injury. EXAM: LEFT KNEE - COMPLETE 4+ VIEW COMPARISON:  None. FINDINGS: There is no evidence of fracture, dislocation, or joint effusion. There is no evidence of arthropathy or other focal bone abnormality. Soft tissues are unremarkable. IMPRESSION: Negative. Electronically Signed   By: Rolm Baptise M.D.   On: 09/04/2015 15:52   Dg Hip Unilat W Or W/o Pelvis 2-3 Views Left  09/04/2015  CLINICAL DATA:  Left hip pain since back surgery in July. No known injury. EXAM: DG HIP (WITH OR WITHOUT PELVIS) 2-3V LEFT COMPARISON:  None. FINDINGS: Hip joints and SI joints are symmetric and unremarkable. No acute bony abnormality. Specifically, no fracture, subluxation, or dislocation. Soft tissues are intact. IMPRESSION: Negative. Electronically Signed   By: Rolm Baptise M.D.   On: 09/04/2015 15:53     Please see individual assessment and plan sections.

## 2015-09-04 NOTE — Patient Instructions (Signed)
Thank you for coming in today. Let me know if Dr Ronnald Ramp thinks the leg pain is from the back.  Return as needed

## 2015-09-04 NOTE — Progress Notes (Signed)
Quick Note:  Hip and knee x-rays are normal without significant arthritis ______

## 2015-09-14 ENCOUNTER — Ambulatory Visit: Payer: BLUE CROSS/BLUE SHIELD | Admitting: Physician Assistant

## 2015-11-09 ENCOUNTER — Encounter: Payer: Self-pay | Admitting: Family Medicine

## 2015-11-09 ENCOUNTER — Ambulatory Visit (INDEPENDENT_AMBULATORY_CARE_PROVIDER_SITE_OTHER): Payer: BLUE CROSS/BLUE SHIELD | Admitting: Family Medicine

## 2015-11-09 VITALS — BP 139/89 | HR 91 | Wt 206.0 lb

## 2015-11-09 DIAGNOSIS — M5440 Lumbago with sciatica, unspecified side: Secondary | ICD-10-CM | POA: Diagnosis not present

## 2015-11-09 DIAGNOSIS — G8929 Other chronic pain: Secondary | ICD-10-CM

## 2015-11-09 MED ORDER — NAPROXEN 500 MG PO TABS: 500.0000 mg | ORAL_TABLET | Freq: Two times a day (BID) | ORAL | Status: DC

## 2015-11-09 MED ORDER — CYCLOBENZAPRINE HCL 10 MG PO TABS
10.0000 mg | ORAL_TABLET | Freq: Three times a day (TID) | ORAL | Status: DC | PRN
Start: 2015-11-09 — End: 2016-01-07

## 2015-11-09 NOTE — Progress Notes (Signed)
       Brittney Young is a 45 y.o. female who presents to [facility] today for back pain. Patient was seen in autumn of 2016 for back pain and radiating pain. She had surgery which helped a lot. She resumed work in mid February. She notes intermittent back pain without significant radiating pain plan on the knees. This is worse with prolonged work. Her work duties include bending pushing and pulling. She notes bilateral low back pain. She denies any weakness or numbness or loss of function. She's tried some over-the-counter medicines which do help. She would like FMLA to allow for an occasional missed a due to pain and for reduced work shift length to 8 hours instead of 12-16 hours sometimes is required.   Past Medical History  Diagnosis Date  . Hypertension   . Depression   . Anxiety    Past Surgical History  Procedure Laterality Date  . Cesarean section     Social History  Substance Use Topics  . Smoking status: Never Smoker   . Smokeless tobacco: Not on file  . Alcohol Use: No   family history includes Cancer in her sister; Stroke in her mother.  ROS as above Medications: Current Outpatient Prescriptions  Medication Sig Dispense Refill  . citalopram (CELEXA) 20 MG tablet Take 1 tablet (20 mg total) by mouth daily. 30 tablet 4  . hydrochlorothiazide (HYDRODIURIL) 25 MG tablet Take 1 tablet (25 mg total) by mouth daily. 30 tablet 5  . metoprolol tartrate (LOPRESSOR) 25 MG tablet Take 1 tablet (25 mg total) by mouth 2 (two) times daily. 60 tablet 5  . potassium chloride SA (K-DUR,KLOR-CON) 20 MEQ tablet Take 1 tablet (20 mEq total) by mouth daily with breakfast. 30 tablet 5  . cyclobenzaprine (FLEXERIL) 10 MG tablet Take 1 tablet (10 mg total) by mouth 3 (three) times daily as needed for muscle spasms. 30 tablet 1  . naproxen (NAPROSYN) 500 MG tablet Take 1 tablet (500 mg total)  by mouth 2 (two) times daily with a meal. 30 tablet 0   No current facility-administered medications for this visit.   No Known Allergies   Exam:  BP 139/89 mmHg  Pulse 91  Wt 206 lb (93.441 kg) Gen: Well NAD Neck nontender to spinal midline. Tender palpation bilateral lumbar paraspinals and SI joint. Low back motion is normal however patient has pain with extension. Lower extremity any reflexes are equal and normal throughout. Lower extremity strength is normal throughout. Normal gait.  No results found for this or any previous visit (from the past 24 hour(s)). No results found.   Please see individual assessment and plan sections.

## 2015-11-09 NOTE — Patient Instructions (Signed)
Thank you for coming in today. Return in 1 month or so.  Continue PT.  Use TENS. Come back or go to the emergency room if you notice new weakness new numbness problems walking or bowel or bladder problems.

## 2015-11-09 NOTE — Assessment & Plan Note (Signed)
Low back pain. Likely myofascial pain. Plan for physical therapy Flexeril and naproxen. FMLA paperwork filled out. Return in one month.

## 2015-11-19 ENCOUNTER — Other Ambulatory Visit (HOSPITAL_COMMUNITY)
Admission: RE | Admit: 2015-11-19 | Discharge: 2015-11-19 | Disposition: A | Discharge status: Home / Self Care | Payer: BLUE CROSS/BLUE SHIELD | Source: Ambulatory Visit | Attending: Physician Assistant | Admitting: Physician Assistant

## 2015-11-19 ENCOUNTER — Encounter: Payer: Self-pay | Admitting: Physician Assistant

## 2015-11-19 ENCOUNTER — Ambulatory Visit (INDEPENDENT_AMBULATORY_CARE_PROVIDER_SITE_OTHER): Payer: BLUE CROSS/BLUE SHIELD | Admitting: Physician Assistant

## 2015-11-19 VITALS — BP 138/82 | HR 94 | Ht 63.0 in | Wt 211.0 lb

## 2015-11-19 DIAGNOSIS — D509 Iron deficiency anemia, unspecified: Secondary | ICD-10-CM

## 2015-11-19 DIAGNOSIS — F329 Major depressive disorder, single episode, unspecified: Secondary | ICD-10-CM

## 2015-11-19 DIAGNOSIS — M79671 Pain in right foot: Secondary | ICD-10-CM

## 2015-11-19 DIAGNOSIS — I10 Essential (primary) hypertension: Secondary | ICD-10-CM

## 2015-11-19 DIAGNOSIS — F418 Other specified anxiety disorders: Secondary | ICD-10-CM

## 2015-11-19 DIAGNOSIS — Z113 Encounter for screening for infections with a predominantly sexual mode of transmission: Secondary | ICD-10-CM | POA: Insufficient documentation

## 2015-11-19 DIAGNOSIS — Z01419 Encounter for gynecological examination (general) (routine) without abnormal findings: Secondary | ICD-10-CM | POA: Insufficient documentation

## 2015-11-19 DIAGNOSIS — Z114 Encounter for screening for human immunodeficiency virus [HIV]: Secondary | ICD-10-CM

## 2015-11-19 DIAGNOSIS — Z1231 Encounter for screening mammogram for malignant neoplasm of breast: Secondary | ICD-10-CM

## 2015-11-19 DIAGNOSIS — R6889 Other general symptoms and signs: Secondary | ICD-10-CM

## 2015-11-19 DIAGNOSIS — Z1151 Encounter for screening for human papillomavirus (HPV): Secondary | ICD-10-CM | POA: Insufficient documentation

## 2015-11-19 DIAGNOSIS — N760 Acute vaginitis: Secondary | ICD-10-CM | POA: Insufficient documentation

## 2015-11-19 DIAGNOSIS — E559 Vitamin D deficiency, unspecified: Secondary | ICD-10-CM

## 2015-11-19 DIAGNOSIS — Z131 Encounter for screening for diabetes mellitus: Secondary | ICD-10-CM

## 2015-11-19 DIAGNOSIS — Z0001 Encounter for general adult medical examination with abnormal findings: Secondary | ICD-10-CM

## 2015-11-19 DIAGNOSIS — Z13220 Encounter for screening for lipoid disorders: Secondary | ICD-10-CM

## 2015-11-19 DIAGNOSIS — F32A Depression, unspecified: Secondary | ICD-10-CM

## 2015-11-19 DIAGNOSIS — R5383 Other fatigue: Secondary | ICD-10-CM

## 2015-11-19 DIAGNOSIS — F419 Anxiety disorder, unspecified: Secondary | ICD-10-CM

## 2015-11-19 MED ORDER — CITALOPRAM HYDROBROMIDE 20 MG PO TABS: 20.0000 mg | ORAL_TABLET | Freq: Every day | ORAL | Status: DC

## 2015-11-19 MED ORDER — POTASSIUM CHLORIDE CRYS ER 20 MEQ PO TBCR: 20.0000 meq | EXTENDED_RELEASE_TABLET | Freq: Every day | ORAL | Status: DC

## 2015-11-19 MED ORDER — METOPROLOL TARTRATE 25 MG PO TABS: 25.0000 mg | ORAL_TABLET | Freq: Two times a day (BID) | ORAL | Status: DC

## 2015-11-19 MED ORDER — HYDROCHLOROTHIAZIDE 25 MG PO TABS: 25.0000 mg | ORAL_TABLET | Freq: Every day | ORAL | Status: DC

## 2015-11-19 NOTE — Patient Instructions (Signed)

## 2015-11-20 LAB — COMPLETE METABOLIC PANEL WITH GFR
ALT: 40 U/L — AB (ref 6–29)
AST: 34 U/L — AB (ref 10–30)
Albumin: 3.7 g/dL (ref 3.6–5.1)
Alkaline Phosphatase: 58 U/L (ref 33–115)
BILIRUBIN TOTAL: 0.3 mg/dL (ref 0.2–1.2)
BUN: 10 mg/dL (ref 7–25)
CALCIUM: 8.7 mg/dL (ref 8.6–10.2)
CHLORIDE: 102 mmol/L (ref 98–110)
CO2: 25 mmol/L (ref 20–31)
CREATININE: 0.68 mg/dL (ref 0.50–1.10)
GFR, Est African American: >89 mL/min (ref >=60)
GFR, Est Non African American: >89 mL/min (ref >=60)
Glucose, Bld: 84 mg/dL (ref 65–99)
Potassium: 3.8 mmol/L (ref 3.5–5.3)
Sodium: 139 mmol/L (ref 135–146)
TOTAL PROTEIN: 7.4 g/dL (ref 6.1–8.1)

## 2015-11-20 LAB — CBC
HCT: 34.3 % — ABNORMAL LOW (ref 36.0–46.0)
Hemoglobin: 10.8 g/dL — ABNORMAL LOW (ref 12.0–15.0)
MCH: 29.3 pg (ref 26.0–34.0)
MCHC: 31.5 g/dL (ref 30.0–36.0)
MCV: 93 fL (ref 78.0–100.0)
MPV: 9.2 fL (ref 8.6–12.4)
PLATELETS: 285 10*3/uL (ref 150–400)
RBC: 3.69 MIL/uL — AB (ref 3.87–5.11)
RDW: 14.8 % (ref 11.5–15.5)
WBC: 6.2 10*3/uL (ref 4.0–10.5)

## 2015-11-20 LAB — LIPID PANEL
CHOLESTEROL: 138 mg/dL (ref 125–200)
HDL: 43 mg/dL — ABNORMAL LOW (ref >=46)
LDL Cholesterol: 70 mg/dL (ref <130)
TRIGLYCERIDES: 124 mg/dL (ref <150)
Total CHOL/HDL Ratio: 3.2 Ratio (ref <=5.0)
VLDL: 25 mg/dL (ref <30)

## 2015-11-20 LAB — HIV ANTIBODY (ROUTINE TESTING W REFLEX): HIV: NONREACTIVE

## 2015-11-20 LAB — VITAMIN D 25 HYDROXY (VIT D DEFICIENCY, FRACTURES): Vit D, 25-Hydroxy: 12 ng/mL — ABNORMAL LOW (ref 30–100)

## 2015-11-20 LAB — TSH: TSH: 1.17 m[IU]/L

## 2015-11-21 LAB — CYTOLOGY - PAP

## 2015-11-21 NOTE — Progress Notes (Signed)
Subjective:     Brittney Young is a 45 y.o. female and is here for a comprehensive physical exam. The patient reports problems - having some on and off again right anterior foot pain. not done anything to make better. not tried any medications. started about 4 weeks ago. no known trauma. .  Social History   Social History  . Marital Status: Single    Spouse Name: N/A  . Number of Children: N/A  . Years of Education: N/A   Occupational History  . Not on file.   Social History Main Topics  . Smoking status: Never Smoker   . Smokeless tobacco: Not on file  . Alcohol Use: No  . Drug Use: No  . Sexual Activity: Not on file   Other Topics Concern  . Not on file   Social History Narrative   Health Maintenance  Topic Date Due  . MAMMOGRAM  04/27/2015  . INFLUENZA VACCINE  04/08/2016  . PAP SMEAR  04/14/2017  . TETANUS/TDAP  05/09/2024  . HIV Screening  Completed    The following portions of the patient's history were reviewed and updated as appropriate: allergies, current medications, past family history, past medical history, past social history, past surgical history and problem list.  Review of Systems Pertinent items noted in HPI and remainder of comprehensive ROS otherwise negative.   Objective:    BP 138/82 mmHg  Pulse 94  Ht 5\' 3"  (1.6 m)  Wt 211 lb (95.709 kg)  BMI 37.39 kg/m2 General appearance: alert, cooperative, appears stated age and moderately obese Head: Normocephalic, without obvious abnormality, atraumatic Eyes: conjunctivae/corneas clear. PERRL, EOM's intact. Fundi benign. Ears: normal TM's and external ear canals both ears Nose: Nares normal. Septum midline. Mucosa normal. No drainage or sinus tenderness. Throat: lips, mucosa, and tongue normal; teeth and gums normal and poor dentition Neck: no adenopathy, no carotid bruit, no JVD, supple, symmetrical, trachea midline and thyroid not enlarged, symmetric, no tenderness/mass/nodules Back:  symmetric, no curvature. ROM normal. No CVA tenderness. Lungs: clear to auscultation bilaterally Heart: regular rate and rhythm, S1, S2 normal, no murmur, click, rub or gallop Abdomen: soft, non-tender; bowel sounds normal; no masses,  no organomegaly Pelvic: cervix normal in appearance, external genitalia normal, no adnexal masses or tenderness, no cervical motion tenderness, uterus normal size, shape, and consistency and vagina normal without discharge Extremities: extremities normal, atraumatic, no cyanosis or edema good ROM of right foot with good Strength. Some tenderness over anterior foot at 4th to 5th metatarsal area. No swelling or redness. Pulses: 2+ and symmetric Skin: Skin color, texture, turgor normal. No rashes or lesions Lymph nodes: Cervical, supraclavicular, and axillary nodes normal. Neurologic: Grossly normal    Assessment:    Healthy female exam.      Plan:    CPE- fasting labs ordered. Pap done today. STD testing done today with HIV. Mammogram ordered. Discussed healthy diet and regular exercise with vitamin D and calcium daily. Discussed weight loss and options.   No energy/vitamin d def- will check TSH,vitamin D, b12, cbc, ferritin.   HTN- controlled refilled meds today.   Anxiety and depression- pt feels controlled today. Refilled medications.   Right foot pain- no acute abnormality noted on exam other than some tenderness.  Due to on and off nature of pain could represent inflammation or small stress fracture. Discussed with patient to wear good supportive shoes for the next few weeks. Ice as needed. NSAIDs for discomfort. Offered xray pt declined today.  See  After Visit Summary for Counseling Recommendations

## 2015-11-23 LAB — CERVICOVAGINAL ANCILLARY ONLY: HERPES (WINDOWPATH): NEGATIVE

## 2015-11-24 ENCOUNTER — Encounter: Payer: Self-pay | Admitting: Physician Assistant

## 2015-11-26 LAB — CERVICOVAGINAL ANCILLARY ONLY
Bacterial vaginitis: POSITIVE — AB
Candida vaginitis: NEGATIVE

## 2015-11-26 MED ORDER — METRONIDAZOLE 500 MG PO TABS: 500.0000 mg | ORAL_TABLET | Freq: Two times a day (BID) | ORAL | Status: DC

## 2015-11-26 NOTE — Addendum Note (Signed)
Addended by: Donella Stade on: 11/26/2015 12:19 PM   Modules accepted: Orders

## 2015-12-05 ENCOUNTER — Ambulatory Visit: Payer: BLUE CROSS/BLUE SHIELD | Admitting: Rehabilitative and Restorative Service Providers"

## 2015-12-12 ENCOUNTER — Telehealth: Payer: Self-pay

## 2015-12-12 NOTE — Telephone Encounter (Signed)
Pt called and left a vm stating that she is considering applying for disability due to continued back pain. She wanted to know how to get the process started. I called pt back and left her a detailed vm advising that she contact the social security administration to get this information and that we will be happy to help through this process.

## 2015-12-20 ENCOUNTER — Encounter: Payer: Self-pay | Admitting: Osteopathic Medicine

## 2015-12-20 ENCOUNTER — Ambulatory Visit (INDEPENDENT_AMBULATORY_CARE_PROVIDER_SITE_OTHER): Payer: BLUE CROSS/BLUE SHIELD | Admitting: Osteopathic Medicine

## 2015-12-20 VITALS — BP 122/86 | HR 92 | Temp 98.2°F | Wt 203.0 lb

## 2015-12-20 DIAGNOSIS — J069 Acute upper respiratory infection, unspecified: Secondary | ICD-10-CM | POA: Diagnosis not present

## 2015-12-20 DIAGNOSIS — B9789 Other viral agents as the cause of diseases classified elsewhere: Principal | ICD-10-CM

## 2015-12-20 MED ORDER — IPRATROPIUM BROMIDE 0.03 % NA SOLN: 2.0000 | Freq: Three times a day (TID) | NASAL | Status: DC

## 2015-12-20 MED ORDER — METHYLPREDNISOLONE 4 MG PO TBPK
ORAL_TABLET | ORAL | Status: DC
Start: 2015-12-20 — End: 2016-01-07

## 2015-12-20 MED ORDER — BENZONATATE 200 MG PO CAPS: 200.0000 mg | ORAL_CAPSULE | Freq: Three times a day (TID) | ORAL | Status: DC | PRN

## 2015-12-20 NOTE — Progress Notes (Signed)
HPI: Brittney Young is a 45 y.o. female who presents to [facility]  today for chief complaint of:  Chief Complaint  Patient presents with  . URI?     [name] Quality: coughing, headache, backache, stuffiess  . Assoc signs/symptoms: see ROS . Duration: worst yesterday, started yesterday  . Modifying factors: has tried the following OTC/Rx medications: sinus pills . Context:  (+) sick contacts   Past medical, social and family history reviewed. Current medications and allergies reviewed.     Review of Systems: CONSTITUTIONAL: 100 F last night fever/chills HEAD/EYES/EARS/NOSE/THROAT: yes headache, no vision change or hearing change, yes sore throat CARDIAC: No chest pain/pressure/palpitations RESPIRATORY: yes cough, no shortness of breath GASTROINTESTINAL: no nausea, no vomiting, no abdominal pain, no diarrhea MUSCULOSKELETAL: yes myalgia/arthralgia   Exam:  BP 122/86 mmHg  Pulse 92  Temp(Src) 98.2 F (36.8 C) (Oral)  Wt 203 lb (92.08 kg)  SpO2 100% Constitutional: VSS, see above. General Appearance: alert, well-developed, well-nourished, NAD Eyes: Normal lids and conjunctive, non-icteric sclera, PERRLA Ears, Nose, Mouth, Throat: Normal external inspection ears/nares/mouth/lips/gums, normal TM, MMM;       posterior pharynx with erythema, without exudate Neck: No masses, trachea midline. normal lymph nodes Respiratory: Normal respiratory effort. No  wheeze/rhonchi/rales Cardiovascular: S1/S2 normal, no murmur/rub/gallop auscultated. RRR.     ASSESSMENT/PLAN:  Viral upper respiratory infection, symptoms present for 1 day, treat symptomatically/supportive care. RTC precautions reviewed   Viral URI with cough - Plan: benzonatate (TESSALON) 200 MG capsule, ipratropium (ATROVENT) 0.03 % nasal spray, methylPREDNISolone (MEDROL DOSEPAK) 4 MG TBPK tablet    Return if symptoms worsen or fail to improve.

## 2015-12-20 NOTE — Patient Instructions (Signed)
DR. Gracelynn Bircher'S HOME CARE INSTRUCTIONS: VIRAL ILLNESSES - ACHES/PAINS, SORE THROAT, SINUSITIS, COUGH, GASTRITIS   FRIST, A FEW NOTES ON OVER-THE-COUNTER MEDICATIONS!   USE CAUTION - MANY OVER-THE-COUNTER MEDICATIONS COME IN COMBINATIONS OF MULTIPLE GENERIC MEDICINES. FOR INSTANCE, NYQUIL HAS TYLENOL + COUGH MEDICINE + AN ANTIHISTAMINE, SO BE CAREFUL YOU'RE NOT TAKING A COMBINATION MEDICINE WHICH CONTAINS MEDICATIONS YOU'RE ALSO TAKING SEPARATELY (LIKE NYQUIL SYRUP PLUS TYLENOL PILLS).   YOUR PHARMACIST CAN HELP YOU AVOID MEDICATION INTERACTIONS AND DUPLICATIONS - ASK FOR THEIR HELP IF YOU ARE CONFUSED OR UNSURE ABOUT WHAT TO PURCHASE OVER-THE-COUNTER!   REMEMBER - IF YOU'RE EVER CONCERNED ABOUT MEDICATION SIDE EFFECTS, OR IF YOU'RE EVER CONCERNED YOUR SYMPTOMS ARE GETTING WORSE DESPITE TREATMENT, PLEASE CALL THE DOCTOR'S OFFICE! IF AFTER-HOURS, YOU CAN BE SEEN IN URGENT CARE OR, FOR SEVERE ILLNESS, PLEASE GO TO THE EMERGENCY ROOM.   TREAT SINUS CONGESTION, RUNNY NOSE & POSTNASAL DRIP:  Treat to increase airflow through sinuses, decrease congestion pain and prevent bacterial growth!   Remember, only 0.5-2% of sinus infections are due to a bacteria, the rest are due to a virus (usually the common cold) which will not get better with antibiotics!  NASAL SPRAYS: generally safe and should not interact with other medicines. Can take any of these medications, either alone or together... FLONASE (FLUTICASONE) - 2 sprays in each nostril twice per day (also a great allergy medicine to use long-term!) AFRIN (OXYMETOLAZONE) - use sparingly because it will cause rebound congestion, NEVER USE IN KIDS  SALINE NASAL SPRAY- no limit, but avoid use after other nasal sprays or it can wash the medicine away PRESCRTIPTION ATROVENT - as directed on prescription bottle  ANTIHISTAMINES: Helps dry out runny nose and decreases postnasal drip. Benadryl may cause drowsiness but other preparations should not be  as sedating. Certain kinds are not as safe in elderly individuals. OK to use unless Dr A says otherwise.  Only use one of the following... BENADRYL (DIPHENHYDRAMINE) - 25-50 mg every 6 hours ZYRTEC (CETIRIZINE) - 5-10 mg daily CLARITIN (LORATIDINE) - less potent. 10 mg daily ALLEGRA (FEXOFENADINE) - least likely to cause drowsiness! 180 mg daily or 60 mg twice per day  DECONGESTANTS: Helps dry out runny nose and helps with sinus pain. May cause insomnia, or sometimes elevated heart rate. Can cause problems if used often in people with high blood pressure. OK to use unless Dr A says otherwise. NEVER USE IN KIDS UNDER 2 YEARS OLD. Only use one of the following... SUDAFED (PSEUDOEPHEDINE) - 60 mg every 4 - 6 hours, also comes in 120 mg extended release every 12 hours, maximum 240 mg in 24 hours.  SUDAED PE (PHENYLEPHRINE) - 10 mg every 4 - 6 hours, maximum 60 mg per day  COMBINATIONS OF ABOVE ANTIHISTAMINES + DECONGESTANTS: these usually require you to show your ID at the pharmacy counter. You can also purchase these medicines separately as noted above.  Only use one of the following... ZYRTEC-D (CETIRIZINE + PSEUDOEPHEDRINE) - 12 hour formulation as directed CLARITIN-D (LORATIDINE + PSEUDOEPHEDRINE) - 12 and 24 hour formulations as directed ALLEGRA-D (FEXOFENADINE + PSEUDOEPHEDRINE) - 12 and 24 hour formulations as directed  PRESCRIPTION TREATMENT FOR SINUS PROBLEMS: Can include nasal sprays, pills, or antibiotics in the case of true bacterial infection. Not everyone needs an antibiotic but there are other medicines which will help you feel better while your body fights the infection!    TREAT COUGH & SORE THROAT:  Remember, cough is the body's way of protecting your   airways and lungs, it's a hard-wired reflex that is tough for medicines to treat!   Irritation to the airways will cause cough. This irritation is usually caused by upper airway problems like postnasal drip (treat as above)  and viral sore throat, but in severe cases can be due to lower airway problems like bronchitis or pneumonia, which a doctor can usually hear on exam of your lungs or see on an X-ray.  Sore throat is almost always due to a virus, but occasionally caused by certain strains of Strep, which requires antibiotics.   Exercise and smoking may make cough worse - take it easy while you're sick, and QUIT SMOKING!   Cough due to viral infection can linger for 2-3 weeks or so. If you're coughing longer than you think you should, or if the cough is severe, please make an appointment in the office - you may need a chest X-ray.  EXPECTORANT: Used to help clear airways, take these with PLENTY of water to help thin mucus secretions and make the mucus easier to cough up  Only use one of the following... ROBITUSSIN (DEXTROMETHORPHAN OR GUAIFENISEN depending on formulation)  MUCINEX (GUAIFENICEN) - usually longer acting  COUGH DROPS/LOZENGES: Whichever over-the-counter agent you prefer!  Here are some suggestions for ingredients to look for (can take both)... BENZOCAINE - numbing effect, also in CHLORASEPTIC THROAT SPRAY MENTHOL - cooling effect  HONEY: has gone head-to-head in several clinical trials with prescription cough medicines and found to be equally effective! Try 1 Teaspoon Honey + 2 Drops Lemon Juice, as much as you want to use. NONE FOR KIDS UNDER AGE 2  HERBAL TEA: There are certain ingredients which help "coat the throat" to relieve pain  such as ELM BARK, LICORICE ROOT, MARSHMALLOW ROOT  PRESCRIPTION TREATMENT FOR COUGH: Reserved for severe cases. Can include pills, syrups or inhalers.    TREAT ACHES & PAINS, FEVER:  Illness causes aches, pains and fever as your body increases its natural inflammation response to help fight the infection.   Rest, good hydration and nutrition, and taking anti-inflammatory medications will help.   Remember: a true fever is a temperature 100.4 or  higher. If you have a fever that is 105.0 or higher, that is a dangerous level and needs medical attention in the office or in the ER!  Can take both of these together if it's ok with your doctor... IBUPROFEN - 400-800 mg every 6 - 8 hours. Ibuprofen and similar medications can cause problems for people with heart disease or history of stomach ulcers, check with Dr A first if you're concerned. Lower doses are usually safe and effective.  TYLENOL (ACETAMINOPHEN) - 500-1000 mg every 6 hours. It won't cause problems with heart or stomach.     TREAT GASTROINTESTINAL SYMPTOMS:  Hydrate, hydrate, hydrate! Try drinking Gatorade/Powerade, broth/soup. Avoid milk and juice, these can make diarrhea worse. You can drink water, of course, but if you are also having vomiting and loose stool you are losing electrolytes which water alone can't replace.  IMMODIUM (LOPERAMIDE) - as directed on the bottle to help with loose stool PRESCRIPTION ZOFRAN (ONDANSETRON) OR PHENERGAN (PROMETHAZINE) - as directed to help nausea and vomiting. Try taking these before you eat if you are having trouble keeping food down.     REMEMBER - THE MOST IMPORTANT THINGS YOU CAN DO TO AVOID CATCHING OR SPREADING ILLNESS INCLUDE:   COVER YOUR COUGH WITH YOUR ARM, NOT WITH YOUR HANDS!   DISINFECT COMMONLY USED SURFACES (SUCH AS   TELEPHONES & DOORKNOBS) WHEN YOU OR SOMEONE CLOSE TO YOU IS FEELING SICK!   BE SURE VACCINES ARE UP TO DATE - GET A FLU SHOT EVERY YEAR! THE FLU CAN BE DEADLY FOR BABIES, ELDERLY FOLKS, AND PEOPLE WITH WEAK IMMUNE SYSTEMS - YOU SHOULD BE VACCINATED TO HELP PREVENT YOURSELF FROM GETTING SICK, BUT ALSO TO PREVENT SOMEONE ELSE FROM GETTING AN INFECTION WHICH MAY HOSPITALIZE OR KILL THEM.  GOOD NUTRITION AND HEALTHY LIFESTYLE WILL HELP YOUR IMMUNE SYSTEM YEAR-ROUND! THERE IS NO MAGIC SUPPLEMENT!  AND ABOVE ALL - WASH YOUR HANDS OFTEN!   

## 2015-12-24 ENCOUNTER — Other Ambulatory Visit: Payer: Self-pay | Admitting: Physician Assistant

## 2015-12-24 DIAGNOSIS — N644 Mastodynia: Secondary | ICD-10-CM

## 2015-12-24 DIAGNOSIS — N63 Unspecified lump in unspecified breast: Secondary | ICD-10-CM

## 2015-12-25 ENCOUNTER — Telehealth: Payer: Self-pay | Admitting: *Deleted

## 2015-12-25 DIAGNOSIS — N63 Unspecified lump in unspecified breast: Secondary | ICD-10-CM

## 2015-12-27 NOTE — Telephone Encounter (Signed)
Diagnostic mammo ordered. °

## 2015-12-28 ENCOUNTER — Other Ambulatory Visit: Payer: BLUE CROSS/BLUE SHIELD

## 2016-01-03 ENCOUNTER — Other Ambulatory Visit: Payer: BLUE CROSS/BLUE SHIELD

## 2016-01-07 ENCOUNTER — Ambulatory Visit (INDEPENDENT_AMBULATORY_CARE_PROVIDER_SITE_OTHER): Payer: BLUE CROSS/BLUE SHIELD | Admitting: Family Medicine

## 2016-01-07 ENCOUNTER — Encounter: Payer: Self-pay | Admitting: Family Medicine

## 2016-01-07 VITALS — BP 156/92 | HR 82 | Wt 205.0 lb

## 2016-01-07 DIAGNOSIS — G8929 Other chronic pain: Secondary | ICD-10-CM

## 2016-01-07 DIAGNOSIS — M5440 Lumbago with sciatica, unspecified side: Secondary | ICD-10-CM

## 2016-01-07 MED ORDER — DICLOFENAC SODIUM 2 % TD SOLN: 2.0000 | Freq: Two times a day (BID) | TRANSDERMAL | Status: DC

## 2016-01-07 MED ORDER — MELOXICAM 15 MG PO TABS: 15.0000 mg | ORAL_TABLET | Freq: Every day | ORAL | Status: DC

## 2016-01-07 NOTE — Patient Instructions (Signed)
Thank you for coming in today. Take meloxicam for pain daily.  Take with prilosec of pepcid to protect the stomach.  Use Pennsaid.  Return in a few weeks.  Return sooner if needed.  Attend PT.

## 2016-01-07 NOTE — Progress Notes (Signed)
       Brittney Young is a 45 y.o. female who presents to [facility] today for Follow-up back pain. Patient has a long history of chronic back pain. She had back surgery a few months ago and returned to work in February. Essentially since returning to work her pain returned. She denies significant radiating pain weakness or numbness. She notes bilateral low back pain. She's tried multiple over-the-counter medicines have not helped. Pain is moderate or severe and limiting her ability to work.    Past Medical History  Diagnosis Date  . Hypertension   . Depression   . Anxiety    Past Surgical History  Procedure Laterality Date  . Cesarean section     Social History  Substance Use Topics  . Smoking status: Never Smoker   . Smokeless tobacco: Not on file  . Alcohol Use: No   family history includes Cancer in her sister; Stroke in her mother.  ROS as above Medications: Current Outpatient Prescriptions  Medication Sig Dispense Refill  . citalopram (CELEXA) 20 MG tablet Take 1 tablet (20 mg total) by mouth daily. 30 tablet 5  . hydrochlorothiazide (HYDRODIURIL) 25 MG tablet Take 1 tablet (25 mg total) by mouth daily. 30 tablet 5  . metoprolol tartrate (LOPRESSOR) 25 MG tablet Take 1 tablet (25 mg total) by mouth 2 (two) times daily. 60 tablet 5  . potassium chloride SA (K-DUR,KLOR-CON) 20 MEQ tablet Take 1 tablet (20 mEq total) by mouth daily with breakfast. 30 tablet 5  . Diclofenac Sodium 2 % SOLN Place 2 sprays onto the skin 2 (two) times daily. 1 Bottle 11  . meloxicam (MOBIC) 15 MG tablet Take 1 tablet (15 mg total) by mouth daily. 30 tablet 2   No current facility-administered medications for this visit.   No Known Allergies   Exam:  BP 156/92 mmHg  Pulse 82  Wt 205 lb (92.987 kg) Gen: Well NAD HEENT: EOMI,  MMM Lungs: Normal work of breathing. CTABL Heart: RRR no  MRG Abd: NABS, Soft. Nondistended, Nontender Exts: Brisk capillary refill, warm and well perfused.  Back: Tender to palpation left lumbar paraspinal muscle. Nontender spinal midline. Normal back motion. Negative slump tests. Lower extremity strength sensation reflexes are intact. Hip motion is normal and nonpainful. Normal gait.  No results found for this or any previous visit (from the past 24 hour(s)). No results found.   9 old woman with now chronic back pain. This is going to be difficult to treat. I think fundamentally her job is not compatible with her body any longer. This is a real dilemma. Trial of meloxicam Pennsaid solution and physical therapy. Recheck in a month.

## 2016-04-26 ENCOUNTER — Other Ambulatory Visit: Payer: Self-pay | Admitting: Physician Assistant

## 2016-05-06 ENCOUNTER — Encounter: Payer: Self-pay | Admitting: Physician Assistant

## 2016-05-06 ENCOUNTER — Ambulatory Visit (INDEPENDENT_AMBULATORY_CARE_PROVIDER_SITE_OTHER): Payer: BLUE CROSS/BLUE SHIELD | Admitting: Physician Assistant

## 2016-05-06 VITALS — BP 129/76 | HR 67 | Wt 198.0 lb

## 2016-05-06 DIAGNOSIS — M26609 Unspecified temporomandibular joint disorder, unspecified side: Secondary | ICD-10-CM | POA: Diagnosis not present

## 2016-05-06 DIAGNOSIS — R51 Headache: Secondary | ICD-10-CM | POA: Diagnosis not present

## 2016-05-06 DIAGNOSIS — R519 Headache, unspecified: Secondary | ICD-10-CM

## 2016-05-06 MED ORDER — PREDNISONE 20 MG PO TABS: ORAL_TABLET | ORAL | 0 refills | Status: DC

## 2016-05-06 NOTE — Patient Instructions (Signed)

## 2016-05-06 NOTE — Progress Notes (Signed)
   Subjective:    Patient ID: Brittney Young, female    DOB: 12/24/1970, 45 y.o.   MRN: [id number]  HPI  Patient is a 45 year old female who presents to the clinic with a headache for the last 2 days that is over her right temple and radiates into jaw. She is taking Tylenol with little to no relief. She is a little bit nauseated today but has not vomited. She denies any ear pain, shortness of breath, cough, sinus drainage, or sore throat. She denies any fever, chills or body aches. She admits she has been more stressed out with her son starting kindergarten and been clinching her jaw more. She does feel like there is some jaw pain with large movements.    Review of Systems See HPI.     Objective:   Physical Exam  Constitutional: She is oriented to person, place, and time. She appears well-developed and well-nourished.  HENT:  Head: Normocephalic and atraumatic.  Right Ear: External ear normal.  Left Ear: External ear normal.  Nose: Nose normal.  Mouth/Throat: Oropharynx is clear and moist. No oropharyngeal exudate.  Tenderness to palpation over right TMJ joint. Range of motion normal. No popping or dislocation with range of motion.  Eyes: Conjunctivae are normal. Right eye exhibits no discharge. Left eye exhibits no discharge.  Neck: Normal range of motion. Neck supple.  Cardiovascular: Normal rate, regular rhythm and normal heart sounds.   Pulmonary/Chest: Effort normal and breath sounds normal. She has no wheezes.  Lymphadenopathy:    She has no cervical adenopathy.  Neurological: She is alert and oriented to person, place, and time.  Psychiatric: She has a normal mood and affect. Her behavior is normal.          Assessment & Plan:  TMJ/headache- Toradol 60mg  IM today. Prednisone taper done. mobic as needed staring tomorrow if headache or right jaw pain returns. Discuss with patient and given handout. I do think her TMJ is causing tension. Discussed ways to relax eliminate  stress. Encouraged icing over right TMJ joint.

## 2016-05-07 DIAGNOSIS — R519 Headache, unspecified: Secondary | ICD-10-CM | POA: Insufficient documentation

## 2016-05-07 DIAGNOSIS — R51 Headache: Secondary | ICD-10-CM

## 2016-05-07 DIAGNOSIS — M26609 Unspecified temporomandibular joint disorder, unspecified side: Secondary | ICD-10-CM | POA: Insufficient documentation

## 2016-05-07 MED ORDER — KETOROLAC TROMETHAMINE 60 MG/2ML IM SOLN
60.0000 mg | Freq: Once | INTRAMUSCULAR | Status: AC
  Administered 2016-05-06: 60 mg via INTRAMUSCULAR

## 2016-05-09 ENCOUNTER — Ambulatory Visit: Payer: Self-pay | Admitting: Family Medicine

## 2016-05-23 ENCOUNTER — Other Ambulatory Visit: Payer: Self-pay | Admitting: Physician Assistant

## 2016-05-23 DIAGNOSIS — I10 Essential (primary) hypertension: Secondary | ICD-10-CM

## 2016-09-03 ENCOUNTER — Ambulatory Visit (INDEPENDENT_AMBULATORY_CARE_PROVIDER_SITE_OTHER): Payer: BLUE CROSS/BLUE SHIELD | Admitting: Physician Assistant

## 2016-09-03 VITALS — BP 139/85 | HR 89

## 2016-09-03 DIAGNOSIS — N63 Unspecified lump in unspecified breast: Secondary | ICD-10-CM

## 2016-09-03 DIAGNOSIS — N6320 Unspecified lump in the left breast, unspecified quadrant: Secondary | ICD-10-CM

## 2016-09-03 DIAGNOSIS — Z23 Encounter for immunization: Secondary | ICD-10-CM | POA: Diagnosis not present

## 2016-09-03 DIAGNOSIS — N644 Mastodynia: Secondary | ICD-10-CM

## 2016-09-03 NOTE — Progress Notes (Signed)
Patient came into clinic today for flu vaccination. Patient tolerated injection of flu immunization in rightt deltoid well, with no immediate complications. Patient is due for her mammogram. Patient reports there are two lumps in her left breast that have to be monitored so she will need diagnotic mammo. Advised to contact our office with any questions/concerns.

## 2016-10-03 ENCOUNTER — Ambulatory Visit (INDEPENDENT_AMBULATORY_CARE_PROVIDER_SITE_OTHER): Payer: BLUE CROSS/BLUE SHIELD | Admitting: Physician Assistant

## 2016-10-03 ENCOUNTER — Encounter: Payer: Self-pay | Admitting: Physician Assistant

## 2016-10-03 VITALS — BP 138/93 | HR 86 | Wt 192.0 lb

## 2016-10-03 DIAGNOSIS — E860 Dehydration: Secondary | ICD-10-CM | POA: Insufficient documentation

## 2016-10-03 DIAGNOSIS — D509 Iron deficiency anemia, unspecified: Secondary | ICD-10-CM | POA: Diagnosis not present

## 2016-10-03 DIAGNOSIS — F332 Major depressive disorder, recurrent severe without psychotic features: Secondary | ICD-10-CM

## 2016-10-03 DIAGNOSIS — F4320 Adjustment disorder, unspecified: Secondary | ICD-10-CM | POA: Diagnosis not present

## 2016-10-03 DIAGNOSIS — G43009 Migraine without aura, not intractable, without status migrainosus: Secondary | ICD-10-CM

## 2016-10-03 DIAGNOSIS — F4321 Adjustment disorder with depressed mood: Secondary | ICD-10-CM

## 2016-10-03 DIAGNOSIS — I10 Essential (primary) hypertension: Secondary | ICD-10-CM | POA: Diagnosis not present

## 2016-10-03 MED ORDER — KETOROLAC TROMETHAMINE 30 MG/ML IJ SOLN
30.0000 mg | Freq: Once | INTRAMUSCULAR | Status: AC
  Administered 2016-10-03: 30 mg via INTRAMUSCULAR

## 2016-10-03 MED ORDER — METOCLOPRAMIDE HCL 10 MG PO TABS: ORAL_TABLET | ORAL | 0 refills | Status: DC

## 2016-10-03 MED ORDER — POTASSIUM CHLORIDE CRYS ER 20 MEQ PO TBCR: 20.0000 meq | EXTENDED_RELEASE_TABLET | Freq: Every day | ORAL | 5 refills | Status: DC

## 2016-10-03 MED ORDER — DEXAMETHASONE SODIUM PHOSPHATE 4 MG/ML IJ SOLN
4.0000 mg | Freq: Once | INTRAMUSCULAR | Status: AC
  Administered 2016-10-03: 4 mg via INTRAMUSCULAR

## 2016-10-03 MED ORDER — FERROUS FUMARATE 324 (106 FE) MG PO TABS: 1.0000 | ORAL_TABLET | Freq: Two times a day (BID) | ORAL | 3 refills | Status: DC

## 2016-10-03 NOTE — Patient Instructions (Addendum)
Do not take any other NSAIDs (ibuprofen, aspirin, aleve, motrin etc) for the next 24 hours If headache returns, Tylenol 1000mg  +  Metoclopramide 10mg  + Benadryl 25mg   Please follow-up with your PCP in 2 weeks   Migraine Headache A migraine headache is an intense, throbbing pain on one side or both sides of the head. Migraines may also cause other symptoms, such as nausea, vomiting, and sensitivity to light and noise. What are the causes? Doing or taking certain things may also trigger migraines, such as:  Alcohol.  Smoking.  Medicines, such as:  Medicine used to treat chest pain (nitroglycerine).  Birth control pills.  Estrogen pills.  Certain blood pressure medicines.  Aged cheeses, chocolate, or caffeine.  Foods or drinks that contain nitrates, glutamate, aspartame, or tyramine.  Physical activity. Other things that may trigger a migraine include:  Menstruation.  Pregnancy.  Hunger.  Stress, lack of sleep, too much sleep, or fatigue.  Weather changes. What increases the risk? The following factors may make you more likely to experience migraine headaches:  Age. Risk increases with age.  Family history of migraine headaches.  Being Caucasian.  Depression and anxiety.  Obesity.  Being a woman.  Having a hole in the heart (patent foramen ovale) or other heart problems. What are the signs or symptoms? The main symptom of this condition is pulsating or throbbing pain. Pain may:  Happen in any area of the head, such as on one side or both sides.  Interfere with daily activities.  Get worse with physical activity.  Get worse with exposure to bright lights or loud noises. Other symptoms may include:  Nausea.  Vomiting.  Dizziness.  General sensitivity to bright lights, loud noises, or smells. Before you get a migraine, you may get warning signs that a migraine is developing (aura). An aura may include:  Seeing flashing lights or having blind  spots.  Seeing bright spots, halos, or zigzag lines.  Having tunnel vision or blurred vision.  Having numbness or a tingling feeling.  Having trouble talking.  Having muscle weakness. How is this diagnosed? A migraine headache can be diagnosed based on:  Your symptoms.  A physical exam.  Tests, such as CT scan or MRI of the head. These imaging tests can help rule out other causes of headaches.  Taking fluid from the spine (lumbar puncture) and analyzing it (cerebrospinal fluid analysis, or CSF analysis). How is this treated? A migraine headache is usually treated with medicines that:  Relieve pain.  Relieve nausea.  Prevent migraines from coming back. Treatment may also include:  Acupuncture.  Lifestyle changes like avoiding foods that trigger migraines. Follow these instructions at home: Medicines  Take over-the-counter and prescription medicines only as told by your health care provider.  Do not drive or use heavy machinery while taking prescription pain medicine.  To prevent or treat constipation while you are taking prescription pain medicine, your health care provider may recommend that you:  Drink enough fluid to keep your urine clear or pale yellow.  Take over-the-counter or prescription medicines.  Eat foods that are high in fiber, such as fresh fruits and vegetables, whole grains, and beans.  Limit foods that are high in fat and processed sugars, such as fried and sweet foods. Lifestyle  Avoid alcohol use.  Do not use any products that contain nicotine or tobacco, such as cigarettes and e-cigarettes. If you need help quitting, ask your health care provider.  Get at least 8 hours of sleep every  night.  Limit your stress. General instructions  Keep a journal to find out what may trigger your migraine headaches. For example, write down:  What you eat and drink.  How much sleep you get.  Any change to your diet or medicines.  If you have a  migraine:  Avoid things that make your symptoms worse, such as bright lights.  It may help to lie down in a dark, quiet room.  Do not drive or use heavy machinery.  Ask your health care provider what activities are safe for you while you are experiencing symptoms.  Keep all follow-up visits as told by your health care provider. This is important. Contact a health care provider if:  You develop symptoms that are different or more severe than your usual migraine symptoms. Get help right away if:  Your migraine becomes severe.  You have a fever.  You have a stiff neck.  You have vision loss.  Your muscles feel weak or like you cannot control them.  You start to lose your balance often.  You develop trouble walking.  You faint. This information is not intended to replace advice given to you by your health care provider. Make sure you discuss any questions you have with your health care provider. Document Released: 08/25/2005 Document Revised: 03/14/2016 Document Reviewed: 02/11/2016 Elsevier Interactive Patient Education  2017 [employer].

## 2016-10-03 NOTE — Progress Notes (Signed)
HPI:                                                                Brittney Young is a 46 y.o. female who presents to [facility]: [facility] today for nausea  Patient with history of major depressive disorder on Celexa 20mg  daily endorses nausea and intermittent headaches since Saturday. Patient states she experienced a loss of a loved one on Saturday and has been grieving this week. She stayed home from work for 3 days. She feels symptoms are related to her depression and grief. She also endorses dehydration. She denies vision changes, focal weakness. She denies fever, neck pain.  Family hx significant for migraines in her sister.  Headaches Mostly right temple, sometimes bilateral frontal 10/10 sharp pain Endorses some sound sensitivity Associated with nausea, no vomiting Lasting hours  Ibuprofen 800mg  - helps    Past Medical History:  Diagnosis Date  . Anxiety   . Depression   . Hypertension    Past Surgical History:  Procedure Laterality Date  . CESAREAN SECTION     Social History  Substance Use Topics  . Smoking status: Never Smoker  . Smokeless tobacco: Not on file  . Alcohol use No   family history includes Cancer in her sister; Stroke in her mother.  ROS: negative except as noted in the HPI  Medications: Current Outpatient Prescriptions  Medication Sig Dispense Refill  . acyclovir (ZOVIRAX) 400 MG tablet take 1 tablet by mouth three times a day for OUTBREAK FOR 5 DAYS 15 tablet 5  . citalopram (CELEXA) 20 MG tablet Take 1 tablet (20 mg total) by mouth daily. 30 tablet 5  . Diclofenac Sodium 2 % SOLN Place 2 sprays onto the skin 2 (two) times daily. 1 Bottle 11  . hydrochlorothiazide (HYDRODIURIL) 25 MG tablet take 1 tablet by mouth once daily 30 tablet 5  . meloxicam (MOBIC) 15 MG tablet Take 1 tablet (15 mg total) by mouth daily. 30 tablet 2  . metoprolol tartrate (LOPRESSOR) 25 MG tablet take 1 tablet by mouth twice  a day 60 tablet 5  . potassium chloride SA (K-DUR,KLOR-CON) 20 MEQ tablet Take 1 tablet (20 mEq total) by mouth daily with breakfast. 30 tablet 5   No current facility-administered medications for this visit.    No Known Allergies     Objective:  BP (!) 138/93   Pulse 86   Wt 192 lb (87.1 kg)   BMI 34.01 kg/m  Gen: well-groomed, cooperative, not ill-appearing, no distress HEENT: normocephalic, atraumatic, normal conjunctiva Pulm: Normal work of breathing, normal phonation, clear to auscultation bilaterally, no wheezes, rales or rhonchi CV: Normal rate, regular rhythm, s1 and s2 distinct, no murmurs, clicks or rubs  GI: soft, nondistended, nontender Neuro: alert and oriented x 3, cranial nerves II-XII intact, no papilledema, negative finger-to-nose test, negative heel-to-shin test, normal coordination Skin: warm and dry, no rashes or lesions on exposed skin, no cyanosis Psych: appropriate affect, depressed mood, normal thought content  Depression screen [facility] 2/9 10/03/2016  Decreased Interest 3  Down, Depressed, Hopeless 2  PHQ - 2 Score 5  Altered sleeping 3  Tired, decreased energy 3  Change in appetite 3  Feeling bad or failure about yourself  3  Trouble concentrating 2  Moving slowly or fidgety/restless 2  Suicidal thoughts 0  PHQ-9 Score 21    GAD 7 : Generalized Anxiety Score 10/03/2016  Nervous, Anxious, on Edge 3  Control/stop worrying 3  Worry too much - different things 3  Trouble relaxing 3  Restless 2  Easily annoyed or irritable 2  Afraid - awful might happen 3  Total GAD 7 Score 19    Assessment and Plan: 46 y.o. female with   Migraine without aura and without status migrainosus, not intractable - IV fluids, IM toradol and decadron given in clinic. Patient felt improvement of headache - home headache plan: Tylenol 1000mg  +  Metoclopramide 10mg  + Benadryl 25mg  - ketorolac (TORADOL) 30 MG/ML injection 30 mg; Inject 1 mL (30 mg total) into the muscle  once. - dexamethasone (DECADRON) injection 4 mg; Inject 1 mL (4 mg total) into the muscle once. - metoCLOPramide (REGLAN) 10 MG tablet; 1 tab PO TID prn Nausea  Dispense: 30 tablet; Refill: 0  Essential hypertension, benign - diastolic BP out of range today. Patient compliant with medications. Going through a stressful time and in pain. Recommend close follow-up. - potassium chloride SA (K-DUR,KLOR-CON) 20 MEQ tablet; Take 1 tablet (20 mEq total) by mouth daily with breakfast.  Dispense: 30 tablet; Refill: 5  Iron deficiency anemia, unspecified iron deficiency anemia type - Ferrous Fumarate (HEMOCYTE - 106 MG FE) 324 (106 Fe) MG TABS tablet; Take 1 tablet (106 mg of iron total) by mouth 2 (two) times daily before a meal.  Dispense: 160 tablet; Refill: 3  Severe episode of recurrent major depressive disorder, with grief reaction - PHQ9 score of 21 today. Patient actively grieving. Would benefit from augmentation therapy with another medication. - cont Celexa daily - close follow-up with PCP in 1 week   Patient education and anticipatory guidance given Patient agrees with treatment plan Follow-up as needed if symptoms worsen or fail to improve  Darlyne Russian PA-C

## 2016-10-03 NOTE — Progress Notes (Signed)
  Subjective:    CC: IV access  HPI: This is a pleasant 46 year old female, with a migraine and dehydrated, and called for further evaluation and definitive treatment establishing IV access, further treatment will be per primary treating provider.  Past medical history:  Negative.  See flowsheet/record as well for more information.  Surgical history: Negative.  See flowsheet/record as well for more information.  Family history: Negative.  See flowsheet/record as well for more information.  Social history: Negative.  See flowsheet/record as well for more information.  Allergies, and medications have been entered into the medical record, reviewed, and no changes needed.   Review of Systems: No fevers, chills, night sweats, weight loss, chest pain, or shortness of breath.   Objective:    General: Well Developed, well nourished, and in no acute distress.  Neuro: Alert and oriented x3, extra-ocular muscles intact, sensation grossly intact.  HEENT: Normocephalic, atraumatic, pupils equal round reactive to light, neck supple, no masses, no lymphadenopathy, thyroid nonpalpable.  Skin: Warm and dry, no rashes. Cardiac: Regular rate and rhythm, no murmurs rubs or gallops, no lower extremity edema.  Respiratory: Clear to auscultation bilaterally. Not using accessory muscles, speaking in full sentences.  22-gauge angiocatheter placed in the right cubital vein. 1 L normal saline infused  Impression and Recommendations:    Dehydration 22-gauge angiocatheter in the right cubital vein, further fluid management per primary treating provider.

## 2016-10-03 NOTE — Assessment & Plan Note (Signed)
22-gauge angiocatheter in the right cubital vein, further fluid management per primary treating provider.

## 2016-10-10 ENCOUNTER — Ambulatory Visit: Payer: BLUE CROSS/BLUE SHIELD | Admitting: Physician Assistant

## 2016-10-17 ENCOUNTER — Telehealth: Payer: Self-pay

## 2016-10-17 NOTE — Telephone Encounter (Signed)
I dont think I will need her to come in. She should send the form and I will put the same stuff we did last year in the form as nothing much has changed.

## 2016-10-18 NOTE — Telephone Encounter (Signed)
fyi

## 2016-10-23 NOTE — Telephone Encounter (Signed)
Notified patient.

## 2016-10-24 ENCOUNTER — Ambulatory Visit: Payer: BLUE CROSS/BLUE SHIELD | Admitting: Physician Assistant

## 2016-11-07 ENCOUNTER — Encounter: Payer: Self-pay | Admitting: Sports Medicine

## 2016-11-07 ENCOUNTER — Ambulatory Visit (INDEPENDENT_AMBULATORY_CARE_PROVIDER_SITE_OTHER): Payer: BLUE CROSS/BLUE SHIELD | Admitting: Sports Medicine

## 2016-11-07 VITALS — BP 139/85 | HR 91 | Wt 193.0 lb

## 2016-11-07 DIAGNOSIS — M961 Postlaminectomy syndrome, not elsewhere classified: Secondary | ICD-10-CM | POA: Diagnosis not present

## 2016-11-07 DIAGNOSIS — N6320 Unspecified lump in the left breast, unspecified quadrant: Secondary | ICD-10-CM | POA: Diagnosis not present

## 2016-11-07 MED ORDER — PREDNISONE 50 MG PO TABS: ORAL_TABLET | ORAL | 0 refills | Status: DC

## 2016-11-07 NOTE — Progress Notes (Signed)
   Subjective:    I'm seeing this patient as a consultation for:  Iran Planas, PA-C  CC: Low back pain  HPI: This is a pleasant 46 year old female, she has long-standing low back pain, with degenerative disc disease and facet arthritis. She was seen sometime in 2016, had some facet joint injections which were not helpful, ultimately saw a neurosurgeon and had what sounds to be a microdiscectomy, I don't really have any further details. She does tell me that her pain was in the left side of low back with radiation to the left anterior thigh, this pain completely resolved after her back surgery, but recurred, it does however feel slightly different. Pain is worse with sitting, flexion, Valsalva, moderate moderate, persistent, no bowel or bladder distention, saddle numbness, constitutional symptoms.  Past medical history:  Negative.  See flowsheet/record as well for more information.  Surgical history: Negative.  See flowsheet/record as well for more information.  Family history: Negative.  See flowsheet/record as well for more information.  Social history: Negative.  See flowsheet/record as well for more information.  Allergies, and medications have been entered into the medical record, reviewed, and no changes needed.   Review of Systems: No headache, visual changes, nausea, vomiting, diarrhea, constipation, dizziness, abdominal pain, skin rash, fevers, chills, night sweats, weight loss, swollen lymph nodes, body aches, joint swelling, muscle aches, chest pain, shortness of breath, mood changes, visual or auditory hallucinations.   Objective:   General: Well Developed, well nourished, and in no acute distress.  Neuro/Psych: Alert and oriented x3, extra-ocular muscles intact, able to move all 4 extremities, sensation grossly intact. Skin: Warm and dry, no rashes noted.  Respiratory: Not using accessory muscles, speaking in full sentences, trachea midline.  Cardiovascular: Pulses palpable, no  extremity edema. Abdomen: Does not appear distended. Back Exam:  Inspection: Unremarkable  Motion: Flexion 45 deg, Extension 45 deg, Side Bending to 45 deg bilaterally,  Rotation to 45 deg bilaterally  SLR laying: Negative  XSLR laying: Negative  Palpable tenderness: None. FABER: negative. Sensory change: Gross sensation intact to all lumbar and sacral dermatomes.  Reflexes: 2+ at both patellar tendons, 2+ at achilles tendons, Babinski's downgoing.  Strength at foot  Plantar-flexion: 5/5 Dorsi-flexion: 5/5 Eversion: 5/5 Inversion: 5/5  Leg strength  Quad: 5/5 Hamstring: 5/5 Hip flexor: 5/5 Hip abductors: 5/5  Gait unremarkable.  Impression and Recommendations:   This case required medical decision making of moderate complexity.  Postlaminectomy syndrome of lumbar region Patient is unsure who did her surgery, where was done, or what level it was done on. She is also unsure as to whether her initial back pain resolved after her surgery. For this reason I'm going to add 5 days of prednisone and go ahead and get a new MRI with IV contrast, I'm going to add a BMP.

## 2016-11-07 NOTE — Assessment & Plan Note (Signed)
Patient is unsure who did her surgery, where was done, or what level it was done on. She is also unsure as to whether her initial back pain resolved after her surgery. For this reason I'm going to add 5 days of prednisone and go ahead and get a new MRI with IV contrast, I'm going to add a BMP.

## 2016-11-08 LAB — BASIC METABOLIC PANEL
BUN: 11 mg/dL (ref 7–25)
CO2: 26 mmol/L (ref 20–31)
Chloride: 103 mmol/L (ref 98–110)
Glucose, Bld: 82 mg/dL (ref 65–99)
Potassium: 4.1 mmol/L (ref 3.5–5.3)
Sodium: 138 mmol/L (ref 135–146)

## 2016-11-08 LAB — BASIC METABOLIC PANEL WITH GFR
Calcium: 8.7 mg/dL (ref 8.6–10.2)
Creat: 1.01 mg/dL (ref 0.50–1.10)

## 2016-11-17 ENCOUNTER — Ambulatory Visit (INDEPENDENT_AMBULATORY_CARE_PROVIDER_SITE_OTHER): Payer: BLUE CROSS/BLUE SHIELD

## 2016-11-17 DIAGNOSIS — M4807 Spinal stenosis, lumbosacral region: Secondary | ICD-10-CM

## 2016-11-17 DIAGNOSIS — M5117 Intervertebral disc disorders with radiculopathy, lumbosacral region: Secondary | ICD-10-CM | POA: Diagnosis not present

## 2016-11-17 MED ORDER — GADOBENATE DIMEGLUMINE 529 MG/ML IV SOLN
20.0000 mL | Freq: Once | INTRAVENOUS | Status: AC | PRN
  Administered 2016-11-17: 20 mL via INTRAVENOUS

## 2016-11-21 ENCOUNTER — Ambulatory Visit (INDEPENDENT_AMBULATORY_CARE_PROVIDER_SITE_OTHER): Payer: BLUE CROSS/BLUE SHIELD | Admitting: Sports Medicine

## 2016-11-21 DIAGNOSIS — M961 Postlaminectomy syndrome, not elsewhere classified: Secondary | ICD-10-CM | POA: Diagnosis not present

## 2016-11-21 NOTE — Progress Notes (Signed)
  Subjective:    CC: Follow-up MRI   HPI: Brittney Young returns, she continues to have back pain with left-sided radicular symptoms down to the top of her foot as well as into her groin and anterior thigh. She is post a left L3-L4 foraminotomy.  MRI did show some fibrosis in the foramina at L3-L4. Pain continues to be moderate, persistent without bowel or bladder dysfunction, saddle numbness or weakness. Prednisone did not help.  Past medical history:  Negative.  See flowsheet/record as well for more information.  Surgical history: Negative.  See flowsheet/record as well for more information.  Family history: Negative.  See flowsheet/record as well for more information.  Social history: Negative.  See flowsheet/record as well for more information.  Allergies, and medications have been entered into the medical record, reviewed, and no changes needed.   Review of Systems: No fevers, chills, night sweats, weight loss, chest pain, or shortness of breath.   Objective:    General: Well Developed, well nourished, and in no acute distress.  Neuro: Alert and oriented x3, extra-ocular muscles intact, sensation grossly intact.  HEENT: Normocephalic, atraumatic, pupils equal round reactive to light, neck supple, no masses, no lymphadenopathy, thyroid nonpalpable.  Skin: Warm and dry, no rashes. Cardiac: Regular rate and rhythm, no murmurs rubs or gallops, no lower extremity edema.  Respiratory: Clear to auscultation bilaterally. Not using accessory muscles, speaking in full sentences.  MRI personally reviewed, there is evidence of a foraminotomy at L3-L4 on the left with fibrosis in the foramen, she also has contact of the L5 nerve root with the left L5-S1 disc just outside the foramen.  Impression and Recommendations:    Postlaminectomy syndrome of lumbar region Does appear to be some contact of the left extraforaminal L5 nerve root with the disc, I also see fibrosis around the L3 nerve root in the  foramina, her prior foraminotomy was on the left L3-L4. Because there are both symptoms in the groin on the left as well as down to the foot in an L5 distribution we are going to proceed with both a left L3-L4 transforaminal epidural as well as a left L5-S1 transforaminal epidural. Return to see me one month after the injections to evaluate response.  I spent 25 minutes with this patient, greater than 50% was face-to-face time counseling regarding the above diagnoses

## 2016-11-21 NOTE — Assessment & Plan Note (Addendum)
Does appear to be some contact of the left extraforaminal L5 nerve root with the disc, I also see fibrosis around the L3 nerve root in the foramina, her prior foraminotomy was on the left L3-L4. Because there are both symptoms in the groin on the left as well as down to the foot in an L5 distribution we are going to proceed with both a left L3-L4 transforaminal epidural as well as a left L5-S1 transforaminal epidural. Return to see me one month after the injections to evaluate response.

## 2016-11-24 ENCOUNTER — Telehealth: Payer: Self-pay

## 2016-11-24 ENCOUNTER — Encounter: Payer: Self-pay | Admitting: Sports Medicine

## 2016-11-24 NOTE — Telephone Encounter (Signed)
Letter in box. 

## 2016-11-24 NOTE — Telephone Encounter (Signed)
Pt left VM asking for a work note to keep her out of work until April 13th. Pt stated she is out of FMLA and is needing to go on short term disability. Please advise.

## 2016-11-24 NOTE — Telephone Encounter (Signed)
Left VM

## 2016-11-26 ENCOUNTER — Ambulatory Visit
Admission: RE | Admit: 2016-11-26 | Discharge: 2016-11-26 | Disposition: A | Discharge status: Home / Self Care | Payer: BLUE CROSS/BLUE SHIELD | Source: Ambulatory Visit | Attending: Sports Medicine | Admitting: Sports Medicine

## 2016-11-26 ENCOUNTER — Other Ambulatory Visit: Payer: Self-pay | Admitting: Sports Medicine

## 2016-11-26 DIAGNOSIS — M961 Postlaminectomy syndrome, not elsewhere classified: Secondary | ICD-10-CM

## 2016-11-26 MED ORDER — IOPAMIDOL (ISOVUE-M 200) INJECTION 41%
1.0000 mL | Freq: Once | INTRAMUSCULAR | Status: AC
  Administered 2016-11-26: 1 mL via EPIDURAL

## 2016-11-26 MED ORDER — METHYLPREDNISOLONE ACETATE 40 MG/ML INJ SUSP (RADIOLOG
120.0000 mg | Freq: Once | INTRAMUSCULAR | Status: AC
  Administered 2016-11-26: 120 mg via EPIDURAL

## 2016-11-26 NOTE — Discharge Instructions (Signed)

## 2016-11-27 ENCOUNTER — Other Ambulatory Visit: Payer: Self-pay | Admitting: Physician Assistant

## 2016-11-27 DIAGNOSIS — F419 Anxiety disorder, unspecified: Principal | ICD-10-CM

## 2016-11-27 DIAGNOSIS — F329 Major depressive disorder, single episode, unspecified: Secondary | ICD-10-CM

## 2016-11-27 DIAGNOSIS — F32A Depression, unspecified: Secondary | ICD-10-CM

## 2016-11-28 ENCOUNTER — Encounter: Payer: Self-pay | Admitting: Sports Medicine

## 2016-11-28 ENCOUNTER — Ambulatory Visit (INDEPENDENT_AMBULATORY_CARE_PROVIDER_SITE_OTHER): Payer: BLUE CROSS/BLUE SHIELD | Admitting: Sports Medicine

## 2016-11-28 DIAGNOSIS — M961 Postlaminectomy syndrome, not elsewhere classified: Secondary | ICD-10-CM | POA: Diagnosis not present

## 2016-11-28 NOTE — Progress Notes (Signed)
  Subjective:    CC: Follow-up  HPI: Brittney Young returns, she just had her lumbar epidurals and is not surprisingly still somewhat sore. She is here mostly for FMLA disability paperwork.  Past medical history:  Negative.  See flowsheet/record as well for more information.  Surgical history: Negative.  See flowsheet/record as well for more information.  Family history: Negative.  See flowsheet/record as well for more information.  Social history: Negative.  See flowsheet/record as well for more information.  Allergies, and medications have been entered into the medical record, reviewed, and no changes needed.   Review of Systems: No fevers, chills, night sweats, weight loss, chest pain, or shortness of breath.   Objective:    General: Well Developed, well nourished, and in no acute distress.  Neuro: Alert and oriented x3, extra-ocular muscles intact, sensation grossly intact.  HEENT: Normocephalic, atraumatic, pupils equal round reactive to light, neck supple, no masses, no lymphadenopathy, thyroid nonpalpable.  Skin: Warm and dry, no rashes. Cardiac: Regular rate and rhythm, no murmurs rubs or gallops, no lower extremity edema.  Respiratory: Clear to auscultation bilaterally. Not using accessory muscles, speaking in full sentences.  Impression and Recommendations:    Postlaminectomy syndrome of lumbar region Does appear to be some contact of the left extraforaminal L5 nerve root with the disc, I also see fibrosis around the L3 nerve root in the foramina, her prior foraminotomy was on the left L3-L4. Because there are both symptoms in the groin on the left as well as down to the foot in an L5 distribution we are going to proceed with both a left L3-L4 transforaminal epidural as well as a left L5-S1 transforaminal epidural.  Patient had her epidural's 2 days ago, today we filled out FMLA and short-term disability paperwork. She is going to follow up with me in one month to evaluate response to  the above epidurals.  I spent 25 minutes with this patient, greater than 50% was face-to-face time counseling regarding the above diagnoses

## 2016-11-28 NOTE — Assessment & Plan Note (Signed)
Does appear to be some contact of the left extraforaminal L5 nerve root with the disc, I also see fibrosis around the L3 nerve root in the foramina, her prior foraminotomy was on the left L3-L4. Because there are both symptoms in the groin on the left as well as down to the foot in an L5 distribution we are going to proceed with both a left L3-L4 transforaminal epidural as well as a left L5-S1 transforaminal epidural.  Patient had her epidural's 2 days ago, today we filled out FMLA and short-term disability paperwork. She is going to follow up with me in one month to evaluate response to the above epidurals.

## 2016-12-19 ENCOUNTER — Encounter: Payer: Self-pay | Admitting: Sports Medicine

## 2016-12-19 ENCOUNTER — Ambulatory Visit (INDEPENDENT_AMBULATORY_CARE_PROVIDER_SITE_OTHER): Payer: BLUE CROSS/BLUE SHIELD | Admitting: Sports Medicine

## 2016-12-19 DIAGNOSIS — M961 Postlaminectomy syndrome, not elsewhere classified: Secondary | ICD-10-CM | POA: Diagnosis not present

## 2016-12-19 MED ORDER — PREGABALIN 75 MG PO CAPS: 75.0000 mg | ORAL_CAPSULE | Freq: Two times a day (BID) | ORAL | 3 refills | Status: DC

## 2016-12-19 NOTE — Progress Notes (Signed)
  Subjective:    CC: Follow-up  HPI: Post laminectomy syndrome:  This is a pleasant 46 year old female with a history of a prior foraminotomy on the left L3-L4.  Initially she responded well to a left L3-L4 and because she was also having left L5 radicular symptoms, a left L5-S1 transforaminal epidural. Unfortunately the pain has returned, as she has only had a similar epidural she is agreeable to proceed with #2 of 3 in the series. No bowel or bladder dysfunction, saddle numbness, constitutional symptoms. She also has more disability paperwork to fill out.  Past medical history:  Negative.  See flowsheet/record as well for more information.  Surgical history: Negative.  See flowsheet/record as well for more information.  Family history: Negative.  See flowsheet/record as well for more information.  Social history: Negative.  See flowsheet/record as well for more information.  Allergies, and medications have been entered into the medical record, reviewed, and no changes needed.   Review of Systems: No fevers, chills, night sweats, weight loss, chest pain, or shortness of breath.   Objective:    General: Well Developed, well nourished, and in no acute distress.  Neuro: Alert and oriented x3, extra-ocular muscles intact, sensation grossly intact.  HEENT: Normocephalic, atraumatic, pupils equal round reactive to light, neck supple, no masses, no lymphadenopathy, thyroid nonpalpable.  Skin: Warm and dry, no rashes. Cardiac: Regular rate and rhythm, no murmurs rubs or gallops, no lower extremity edema.  Respiratory: Clear to auscultation bilaterally. Not using accessory muscles, speaking in full sentences.  Impression and Recommendations:    Postlaminectomy syndrome of lumbar region Did have a good response for almost a month to her initial left L3-L4 and left L5-S1 transforaminal epidural injections.  Does appear to be some contact of the left extraforaminal L5 nerve root with the disc, I  also see fibrosis around the L3 nerve root in the foramina, her prior foraminotomy was on the left L3-L4. Because there are both symptoms in the groin on the left as well as down to the foot in an L5 distribution we are going to proceed with her second set of epidurals, both a left L3-L4 transforaminal epidural as well as a left L5-S1 transforaminal epidural. Also adding Lyrica.  I spent 25 minutes with this patient, greater than 50% was face-to-face time counseling regarding the above diagnoses

## 2016-12-19 NOTE — Assessment & Plan Note (Signed)
Did have a good response for almost a month to her initial left L3-L4 and left L5-S1 transforaminal epidural injections.  Does appear to be some contact of the left extraforaminal L5 nerve root with the disc, I also see fibrosis around the L3 nerve root in the foramina, her prior foraminotomy was on the left L3-L4. Because there are both symptoms in the groin on the left as well as down to the foot in an L5 distribution we are going to proceed with her second set of epidurals, both a left L3-L4 transforaminal epidural as well as a left L5-S1 transforaminal epidural. Also adding Lyrica.

## 2017-01-05 ENCOUNTER — Other Ambulatory Visit: Payer: Self-pay | Admitting: Sports Medicine

## 2017-01-05 ENCOUNTER — Ambulatory Visit
Admission: RE | Admit: 2017-01-05 | Discharge: 2017-01-05 | Disposition: A | Discharge status: Home / Self Care | Payer: BLUE CROSS/BLUE SHIELD | Source: Ambulatory Visit | Attending: Sports Medicine | Admitting: Sports Medicine

## 2017-01-05 DIAGNOSIS — M961 Postlaminectomy syndrome, not elsewhere classified: Secondary | ICD-10-CM

## 2017-01-05 MED ORDER — METHYLPREDNISOLONE ACETATE 40 MG/ML INJ SUSP (RADIOLOG
120.0000 mg | Freq: Once | INTRAMUSCULAR | Status: AC
  Administered 2017-01-05: 120 mg via EPIDURAL

## 2017-01-05 MED ORDER — IOPAMIDOL (ISOVUE-M 200) INJECTION 41%
1.0000 mL | Freq: Once | INTRAMUSCULAR | Status: AC
  Administered 2017-01-05: 1 mL via EPIDURAL

## 2017-01-05 NOTE — Discharge Instructions (Signed)

## 2017-01-16 ENCOUNTER — Encounter: Payer: Self-pay | Admitting: Sports Medicine

## 2017-01-16 ENCOUNTER — Ambulatory Visit (INDEPENDENT_AMBULATORY_CARE_PROVIDER_SITE_OTHER): Payer: BLUE CROSS/BLUE SHIELD | Admitting: Sports Medicine

## 2017-01-16 DIAGNOSIS — M961 Postlaminectomy syndrome, not elsewhere classified: Secondary | ICD-10-CM

## 2017-01-16 MED ORDER — PREGABALIN 25 MG PO CAPS: 25.0000 mg | ORAL_CAPSULE | Freq: Two times a day (BID) | ORAL | 3 refills | Status: DC

## 2017-01-16 NOTE — Progress Notes (Signed)
  Subjective:    CC: Follow-up after epidural  HPI: This is a pleasant 46 year old female with known multilevel lumbar degenerative disc disease. She is post microdiscectomy on the left L3-L4.  Unfortunately she's had persistent radicular symptoms both into the groin as well as down the leg in an L5 distribution. We have done L3-L4  and L5-S1 transforaminal epidural's, the initial set provided some improvement relief but the second set did not.  Lyrica 75 mg was too high and she is agreeable to drop at 25 mg.  Past medical history:  Negative.  See flowsheet/record as well for more information.  Surgical history: Negative.  See flowsheet/record as well for more information.  Family history: Negative.  See flowsheet/record as well for more information.  Social history: Negative.  See flowsheet/record as well for more information.  Allergies, and medications have been entered into the medical record, reviewed, and no changes needed.   Review of Systems: No fevers, chills, night sweats, weight loss, chest pain, or shortness of breath.   Objective:    General: Well Developed, well nourished, and in no acute distress.  Neuro: Alert and oriented x3, extra-ocular muscles intact, sensation grossly intact.  HEENT: Normocephalic, atraumatic, pupils equal round reactive to light, neck supple, no masses, no lymphadenopathy, thyroid nonpalpable.  Skin: Warm and dry, no rashes. Cardiac: Regular rate and rhythm, no murmurs rubs or gallops, no lower extremity edema.  Respiratory: Clear to auscultation bilaterally. Not using accessory muscles, speaking in full sentences.  Impression and Recommendations:    Postlaminectomy syndrome of lumbar region Has now had 2 sets of epidurals on the left at L3-L4 and L5-S1, both transforaminal. Initial injections provided good relief but the most recent injections did not. She does have a history of foraminotomy and microdiscectomy on the left L3-L4. There is 75 was  too potent, dropping down to 25 mg. Considering failure of her to epidurals at these 2 levels I think she is a candidate now for microdiscectomies at L3-L4 and L5-S1 on the left. Referral to [place] [facility] downstairs.  I spent 25 minutes with this patient, greater than 50% was face-to-face time counseling regarding the above diagnoses

## 2017-01-16 NOTE — Assessment & Plan Note (Signed)
Has now had 2 sets of epidurals on the left at L3-L4 and L5-S1, both transforaminal. Initial injections provided good relief but the most recent injections did not. She does have a history of foraminotomy and microdiscectomy on the left L3-L4. There is 75 was too potent, dropping down to 25 mg. Considering failure of her to epidurals at these 2 levels I think she is a candidate now for microdiscectomies at L3-L4 and L5-S1 on the left. Referral to [place] [facility] downstairs.

## 2017-01-23 ENCOUNTER — Encounter: Payer: Self-pay | Admitting: Sports Medicine

## 2017-01-23 ENCOUNTER — Ambulatory Visit (INDEPENDENT_AMBULATORY_CARE_PROVIDER_SITE_OTHER): Payer: BLUE CROSS/BLUE SHIELD | Admitting: Sports Medicine

## 2017-01-23 DIAGNOSIS — M961 Postlaminectomy syndrome, not elsewhere classified: Secondary | ICD-10-CM | POA: Diagnosis not present

## 2017-01-23 NOTE — Assessment & Plan Note (Addendum)
Has now had 2 sets of epidurals on the left at L3-L4 and L5-S1, both transforaminal. Initial injections provided good relief but the most recent injections did not. She does have a history of foraminotomy and microdiscectomy on the left L3-L4. There is 75 was too potent, dropping down to 25 mg. Considering failure of her to epidurals at these 2 levels I think she is a candidate now for microdiscectomies at L3-L4 and L5-S1 on the left. Referral to [place] [facility] downstairs.  Update for today: More FMLA paperwork filled out. Still awaiting neurosurgery visit.

## 2017-01-23 NOTE — Progress Notes (Signed)
  Subjective:    CC: Follow-up  HPI: Brittney Young is here, she has more FMLA paperwork to fill out/short-term disability  Past medical history:  Negative.  See flowsheet/record as well for more information.  Surgical history: Negative.  See flowsheet/record as well for more information.  Family history: Negative.  See flowsheet/record as well for more information.  Social history: Negative.  See flowsheet/record as well for more information.  Allergies, and medications have been entered into the medical record, reviewed, and no changes needed.   Review of Systems: No fevers, chills, night sweats, weight loss, chest pain, or shortness of breath.   Objective:    General: Well Developed, well nourished, and in no acute distress.  Neuro: Alert and oriented x3, extra-ocular muscles intact, sensation grossly intact.  HEENT: Normocephalic, atraumatic, pupils equal round reactive to light, neck supple, no masses, no lymphadenopathy, thyroid nonpalpable.  Skin: Warm and dry, no rashes. Cardiac: Regular rate and rhythm, no murmurs rubs or gallops, no lower extremity edema.  Respiratory: Clear to auscultation bilaterally. Not using accessory muscles, speaking in full sentences.  Impression and Recommendations:    Postlaminectomy syndrome of lumbar region Has now had 2 sets of epidurals on the left at L3-L4 and L5-S1, both transforaminal. Initial injections provided good relief but the most recent injections did not. She does have a history of foraminotomy and microdiscectomy on the left L3-L4. There is 75 was too potent, dropping down to 25 mg. Considering failure of her to epidurals at these 2 levels I think she is a candidate now for microdiscectomies at L3-L4 and L5-S1 on the left. Referral to [place] [facility] downstairs.  Update for today: More FMLA paperwork filled out. Still awaiting neurosurgery visit.  I spent 25 minutes with this patient, greater than 50% was face-to-face time  counseling regarding the above diagnoses

## 2017-01-26 ENCOUNTER — Ambulatory Visit (INDEPENDENT_AMBULATORY_CARE_PROVIDER_SITE_OTHER): Payer: BLUE CROSS/BLUE SHIELD | Admitting: Sports Medicine

## 2017-01-26 ENCOUNTER — Encounter: Payer: Self-pay | Admitting: Sports Medicine

## 2017-01-26 DIAGNOSIS — M961 Postlaminectomy syndrome, not elsewhere classified: Secondary | ICD-10-CM | POA: Diagnosis not present

## 2017-01-26 DIAGNOSIS — F32A Depression, unspecified: Secondary | ICD-10-CM

## 2017-01-26 DIAGNOSIS — F329 Major depressive disorder, single episode, unspecified: Secondary | ICD-10-CM | POA: Diagnosis not present

## 2017-01-26 DIAGNOSIS — F419 Anxiety disorder, unspecified: Secondary | ICD-10-CM | POA: Diagnosis not present

## 2017-01-26 MED ORDER — AMITRIPTYLINE HCL 10 MG PO TABS: 10.0000 mg | ORAL_TABLET | Freq: Every day | ORAL | 3 refills | Status: DC

## 2017-01-26 MED ORDER — CITALOPRAM HYDROBROMIDE 20 MG PO TABS: 20.0000 mg | ORAL_TABLET | Freq: Every day | ORAL | 3 refills | Status: DC

## 2017-01-26 NOTE — Assessment & Plan Note (Signed)
Has now had 2 sets of epidurals on the left at L3-L4 and L5-S1, both transforaminal. Initial injections provided good relief but the most recent injections did not. She does have a history of foraminotomy and microdiscectomy on the left L3-L4. There is 75 was too potent, dropping down to 25 mg. Considering failure of her to epidurals at these 2 levels I think she is a candidate now for microdiscectomies at L3-L4 and L5-S1 on the left. Referral to [place] [facility] downstairs.  Update for today: Patient wanted to discuss disability, I advised her that we would not discuss long-term disability until everything had been done, even then she would probably only get a few percent. Adding amitriptyline 10 mg at bedtime. Still awaiting neurosurgery visit.

## 2017-01-26 NOTE — Progress Notes (Signed)
  Subjective:    CC: Follow-up  HPI: This pleasant 46 year old female is here to discuss permanent disability, please see below in the assessment and plan, she has not yet touched base with neurosurgery, continues to have occasional radicular symptoms. She is tolerating low-dose Lyrica for now.  Past medical history:  Negative.  See flowsheet/record as well for more information.  Surgical history: Negative.  See flowsheet/record as well for more information.  Family history: Negative.  See flowsheet/record as well for more information.  Social history: Negative.  See flowsheet/record as well for more information.  Allergies, and medications have been entered into the medical record, reviewed, and no changes needed.   Review of Systems: No fevers, chills, night sweats, weight loss, chest pain, or shortness of breath.   Objective:    General: Well Developed, well nourished, and in no acute distress.  Neuro: Alert and oriented x3, extra-ocular muscles intact, sensation grossly intact.  HEENT: Normocephalic, atraumatic, pupils equal round reactive to light, neck supple, no masses, no lymphadenopathy, thyroid nonpalpable.  Skin: Warm and dry, no rashes. Cardiac: Regular rate and rhythm, no murmurs rubs or gallops, no lower extremity edema.  Respiratory: Clear to auscultation bilaterally. Not using accessory muscles, speaking in full sentences.  Impression and Recommendations:    Postlaminectomy syndrome of lumbar region Has now had 2 sets of epidurals on the left at L3-L4 and L5-S1, both transforaminal. Initial injections provided good relief but the most recent injections did not. She does have a history of foraminotomy and microdiscectomy on the left L3-L4. There is 75 was too potent, dropping down to 25 mg. Considering failure of her to epidurals at these 2 levels I think she is a candidate now for microdiscectomies at L3-L4 and L5-S1 on the left. Referral to [place] [facility]  downstairs.  Update for today: Patient wanted to discuss disability, I advised her that we would not discuss long-term disability until everything had been done, even then she would probably only get a few percent. Adding amitriptyline 10 mg at bedtime. Still awaiting neurosurgery visit.  I spent 25 minutes with this patient, greater than 50% was face-to-face time counseling regarding the above diagnoses

## 2017-02-10 ENCOUNTER — Ambulatory Visit (INDEPENDENT_AMBULATORY_CARE_PROVIDER_SITE_OTHER): Payer: BLUE CROSS/BLUE SHIELD | Admitting: Osteopathic Medicine

## 2017-02-10 VITALS — BP 146/83 | HR 88 | Ht 64.0 in | Wt 203.0 lb

## 2017-02-10 DIAGNOSIS — N898 Other specified noninflammatory disorders of vagina: Secondary | ICD-10-CM

## 2017-02-10 MED ORDER — FLUCONAZOLE 150 MG PO TABS: 150.0000 mg | ORAL_TABLET | Freq: Once | ORAL | 1 refills | Status: AC

## 2017-02-10 MED ORDER — METRONIDAZOLE 500 MG PO TABS: 500.0000 mg | ORAL_TABLET | Freq: Two times a day (BID) | ORAL | 0 refills | Status: DC

## 2017-02-10 NOTE — Patient Instructions (Signed)
Plan:  Treat for now as yeast infection w/ bacterial vaginosis  Pills will treat yeast of the skin as well  If you develop blisters or sores on the skin, please come see [place] as this can indicate a more serious infection   Any questions, please let us know!

## 2017-02-10 NOTE — Progress Notes (Signed)
HPI: Brittney Young is a 46 y.o. female  who presents to [facility] today, 02/10/17,  for chief complaint of:  Chief Complaint  Patient presents with  . Vaginal Discharge     . Location: vaginal . Quality: itching on exterior, some discharge but not too bothersome . Duration: worse past few days . Modifying factors: has tried OTC yeast treatments. Unprotected sex x1 few months ago, no recent STI testing  . Assoc signs/symptoms: no abd pain/fever.     Past medical history, surgical history, social history and family history reviewed.  Patient Active Problem List   Diagnosis Date Noted  . Dehydration 10/03/2016  . Severe episode of recurrent major depressive disorder, without psychotic features ([place]) 10/03/2016  . Grief reaction 10/03/2016  . TMJ (temporomandibular joint syndrome) 05/07/2016  . Cephalalgia 05/07/2016  . Knee pain, left 09/04/2015  . Postlaminectomy syndrome of lumbar region 04/02/2015  . Anxiety and depression 12/18/2014  . Anemia 07/03/2014  . Vitamin D deficiency 07/03/2014  . Other fatigue 07/03/2014  . Essential hypertension, benign 06/30/2014  . Genital herpes 06/30/2014    Current medication list and allergy/intolerance information reviewed.   Current Outpatient Prescriptions on File Prior to Visit  Medication Sig Dispense Refill  . acyclovir (ZOVIRAX) 400 MG tablet take 1 tablet by mouth three times a day for OUTBREAK FOR 5 DAYS 15 tablet 5  . amitriptyline (ELAVIL) 10 MG tablet Take 1 tablet (10 mg total) by mouth at bedtime. 30 tablet 3  . citalopram (CELEXA) 20 MG tablet Take 1 tablet (20 mg total) by mouth daily. 90 tablet 3  . Ferrous Fumarate (HEMOCYTE - 106 MG FE) 324 (106 Fe) MG TABS tablet Take 1 tablet (106 mg of iron total) by mouth 2 (two) times daily before a meal. 160 tablet 3  . hydrochlorothiazide (HYDRODIURIL) 25 MG tablet take 1 tablet by mouth once daily 30 tablet 5  . metoprolol tartrate  (LOPRESSOR) 25 MG tablet take 1 tablet by mouth twice a day 60 tablet 5  . potassium chloride SA (K-DUR,KLOR-CON) 20 MEQ tablet Take 1 tablet (20 mEq total) by mouth daily with breakfast. 30 tablet 5  . pregabalin (LYRICA) 25 MG capsule Take 1 capsule (25 mg total) by mouth 2 (two) times daily. 30 capsule 3   No current facility-administered medications on file prior to visit.    No Known Allergies    Review of Systems:  Constitutional: No recent illness  Cardiac: No  chest pain,  Respiratory:  No  shortness of breath  Gastrointestinal: No  abdominal pain, no change on bowel habits  Skin: No  Rash  GU: as per HPI  Exam:  BP (!) 146/83   Pulse 88   Ht 5\' 4"  (1.626 m)   Wt 203 lb (92.1 kg)   BMI 34.84 kg/m   Constitutional: VS see above. General Appearance: alert, well-developed, well-nourished, NAD  Eyes: Normal lids and conjunctive, non-icteric sclera  Ears, Nose, Mouth, Throat: MMM, Normal external inspection ears/nares/mouth/lips/gums.  Neck: No masses, trachea midline.   Respiratory: Normal respiratory effort. no wheeze, no rhonchi, no rales  Cardiovascular: S1/S2 normal, no murmur, no rub/gallop auscultated. RRR.   Musculoskeletal: Gait normal. Symmetric and independent movement of all extremities  Neurological: Normal balance/coordination. No tremor.  Skin: warm, dry, see GU exam.   Psychiatric: Normal judgment/insight. Normal mood and affect. Oriented x3.  GYN: No blisters/ulcers to external genitalia but labia majora near introitus is showing some excoriations c/s irritation/scratching  no laceration, no erythema/drainage, normal urethra, normal vaginal mucosa, thick whitish-clear discharge, cervix normal without lesions, uterus not enlarged or tender, adnexa no masses and nontender   No results found for this or any previous visit (from the past 24 hour(s)).   ASSESSMENT/PLAN:   Vaginal discharge - Plan: Wet prep, genital, metroNIDAZOLE (FLAGYL) 500 MG  tablet, fluconazole (DIFLUCAN) 150 MG tablet, GC/Chlamydia Probe Amp - not sure why wet prep not resulted next day - will look into this. Await STI panel. Treat as BV/Yeast, no apparent HSV on genitalia though hx HSV in problem list     Patient Instructions  Plan:  Treat for now as yeast infection w/ bacterial vaginosis  Pills will treat yeast of the skin as well  If you develop blisters or sores on the skin, please come see [place] as this can indicate a more serious infection   Any questions, please let us know!     Follow-up plan: Return if symptoms worsen or fail to improve.  Visit summary with medication list and pertinent instructions was printed for patient to review, alert [place] if any changes needed. All questions at time of visit were answered - patient instructed to contact office with any additional concerns. ER/RTC precautions were reviewed with the patient and understanding verbalized.

## 2017-02-11 LAB — WET PREP, GENITAL
Clue Cells Wet Prep HPF POC: NONE SEEN
Trich, Wet Prep: NONE SEEN
WBC, Wet Prep HPF POC: NONE SEEN
YEAST WET PREP: NONE SEEN

## 2017-02-11 LAB — GC/CHLAMYDIA PROBE AMP
CT Probe RNA: NOT DETECTED
GC Probe RNA: NOT DETECTED

## 2017-02-22 ENCOUNTER — Emergency Department (INDEPENDENT_AMBULATORY_CARE_PROVIDER_SITE_OTHER)
Admission: EM | Admit: 2017-02-22 | Discharge: 2017-02-22 | Disposition: A | Discharge status: Home / Self Care | Payer: BLUE CROSS/BLUE SHIELD | Source: Home / Self Care | Attending: Family Medicine | Admitting: Family Medicine

## 2017-02-22 ENCOUNTER — Encounter: Payer: Self-pay | Admitting: Emergency Medicine

## 2017-02-22 DIAGNOSIS — J069 Acute upper respiratory infection, unspecified: Secondary | ICD-10-CM | POA: Diagnosis not present

## 2017-02-22 DIAGNOSIS — B9789 Other viral agents as the cause of diseases classified elsewhere: Secondary | ICD-10-CM

## 2017-02-22 MED ORDER — FLUTICASONE PROPIONATE 50 MCG/ACT NA SUSP: 2.0000 | Freq: Every day | NASAL | 2 refills | Status: DC

## 2017-02-22 MED ORDER — BENZONATATE 100 MG PO CAPS: 100.0000 mg | ORAL_CAPSULE | Freq: Three times a day (TID) | ORAL | 0 refills | Status: DC

## 2017-02-22 NOTE — ED Triage Notes (Signed)
Patient presents to [facility] with C/O sore throat, bilateral ear pain. Hurts to swallow, rates pain 8/10. Symptoms since Friday

## 2017-02-22 NOTE — ED Provider Notes (Signed)
CSN: [id number]     Arrival date & time 02/22/17  1523 History   First MD Initiated Contact with Patient 02/22/17 1607     Chief Complaint  Patient presents with  . Sore Throat  . Otalgia   (Consider location/radiation/quality/duration/timing/severity/associated sxs/prior Treatment) HPI  Brittney Young is a 46 y.o. female presenting to UC with c/o sore throat, bilateral ear pain, and mild congestion with cough and post-nasal drip for 2 days.  She initially thought symptoms were due to allergies so she has taken OTC allergy medication.  She has not taken any pain medication. Throat is burning, 8/10.  She notes her son was sick last week with similar symptoms but they resolved within 1 week.  Denies fever but has had chills. Denies n/v/d.    Past Medical History:  Diagnosis Date  . Anxiety   . Depression   . Hypertension    Past Surgical History:  Procedure Laterality Date  . CESAREAN SECTION     Family History  Problem Relation Age of Onset  . Stroke Mother   . Cancer Sister   . Migraines Sister    Social History  Substance Use Topics  . Smoking status: Never Smoker  . Smokeless tobacco: Never Used  . Alcohol use No   OB History    No data available     Review of Systems  Constitutional: Positive for chills. Negative for fever.  HENT: Positive for congestion, ear pain, postnasal drip and sore throat. Negative for trouble swallowing and voice change.   Respiratory: Positive for cough. Negative for shortness of breath.   Cardiovascular: Negative for chest pain and palpitations.  Gastrointestinal: Negative for abdominal pain, diarrhea, nausea and vomiting.  Musculoskeletal: Negative for arthralgias, back pain and myalgias.  Skin: Negative for rash.    Allergies  Patient has no known allergies.  Home Medications   Prior to Admission medications   Medication Sig Start Date End Date Taking? Authorizing Provider  acyclovir (ZOVIRAX) 400 MG tablet take 1 tablet by  mouth three times a day for OUTBREAK FOR 5 DAYS 04/28/16   Iran Planas L, PA-C  amitriptyline (ELAVIL) 10 MG tablet Take 1 tablet (10 mg total) by mouth at bedtime. 01/26/17   Silverio Decamp, MD  benzonatate (TESSALON) 100 MG capsule Take 1-2 capsules (100-200 mg total) by mouth every 8 (eight) hours. 02/22/17   Noe Gens, PA-C  citalopram (CELEXA) 20 MG tablet Take 1 tablet (20 mg total) by mouth daily. 01/26/17   Silverio Decamp, MD  Ferrous Fumarate (HEMOCYTE - 106 MG FE) 324 (106 Fe) MG TABS tablet Take 1 tablet (106 mg of iron total) by mouth 2 (two) times daily before a meal. 10/03/16   Trixie Dredge, PA-C  fluticasone [facility]) 50 MCG/ACT nasal spray Place 2 sprays into both nostrils daily. 02/22/17   Noe Gens, PA-C  hydrochlorothiazide (HYDRODIURIL) 25 MG tablet take 1 tablet by mouth once daily 05/23/16   Breeback, Jade L, PA-C  metoprolol tartrate (LOPRESSOR) 25 MG tablet take 1 tablet by mouth twice a day 05/23/16   Iran Planas L, PA-C  metroNIDAZOLE (FLAGYL) 500 MG tablet Take 1 tablet (500 mg total) by mouth 2 (two) times daily. 02/10/17   Emeterio Reeve, DO  potassium chloride SA (K-DUR,KLOR-CON) 20 MEQ tablet Take 1 tablet (20 mEq total) by mouth daily with breakfast. 10/03/16   Trixie Dredge, PA-C  pregabalin (LYRICA) 25 MG capsule Take 1 capsule (25 mg total) by mouth  2 (two) times daily. 01/16/17   Silverio Decamp, MD   Meds Ordered and Administered this Visit  Medications - No data to display  BP 130/86 (BP Location: Left Arm)   Pulse 99   Temp 99.2 F (37.3 C) (Oral)   Resp 16   Ht 5\' 4"  (1.626 m)   Wt 201 lb 8 oz (91.4 kg)   LMP 02/21/2017   SpO2 100%   BMI 34.59 kg/m  No data found.   Physical Exam  Constitutional: She is oriented to person, place, and time. She appears well-developed and well-nourished. No distress.  HENT:  Head: Normocephalic and atraumatic.  Right Ear: Tympanic membrane normal.  Left  Ear: Tympanic membrane normal.  Nose: Mucosal edema present. Right sinus exhibits no maxillary sinus tenderness and no frontal sinus tenderness. Left sinus exhibits no maxillary sinus tenderness and no frontal sinus tenderness.  Mouth/Throat: Uvula is midline, oropharynx is clear and moist and mucous membranes are normal. No oropharyngeal exudate, posterior oropharyngeal edema or posterior oropharyngeal erythema.  Eyes: EOM are normal.  Neck: Normal range of motion. Neck supple.  Cardiovascular: Normal rate and regular rhythm.   Pulmonary/Chest: Effort normal and breath sounds normal. No stridor. No respiratory distress. She has no wheezes. She has no rales.  Musculoskeletal: Normal range of motion.  Lymphadenopathy:    She has no cervical adenopathy.  Neurological: She is alert and oriented to person, place, and time.  Skin: Skin is warm and dry. She is not diaphoretic.  Psychiatric: She has a normal mood and affect. Her behavior is normal.  Nursing note and vitals reviewed.   Urgent Care Course     Procedures (including critical care time)  Labs Review Labs Reviewed - No data to display  Imaging Review No results found.   MDM   1. Viral URI with cough    Hx and exam c/w viral illness Encouraged symptomatic treatment.  Rx: Tessalon and Flonase  Encouraged fluids, rest, acetaminophen, ibuprofen and saltwater gargles  F/u with PCP in 1 week if not improving.    Noe Gens, [place] 02/23/17 [phone]

## 2017-03-04 ENCOUNTER — Encounter: Payer: Self-pay | Admitting: Physical Therapy

## 2017-03-04 ENCOUNTER — Ambulatory Visit (INDEPENDENT_AMBULATORY_CARE_PROVIDER_SITE_OTHER): Payer: BLUE CROSS/BLUE SHIELD | Admitting: Physical Therapy

## 2017-03-04 DIAGNOSIS — M5442 Lumbago with sciatica, left side: Secondary | ICD-10-CM

## 2017-03-04 DIAGNOSIS — R293 Abnormal posture: Secondary | ICD-10-CM | POA: Diagnosis not present

## 2017-03-04 DIAGNOSIS — M6281 Muscle weakness (generalized): Secondary | ICD-10-CM | POA: Diagnosis not present

## 2017-03-04 NOTE — Patient Instructions (Addendum)
Pelvic Tilt: Posterior - Legs Bent (Supine) - hands on front of hips to feel for movement    Tighten stomach and flatten back by rolling pelvis down. Hold __3__ seconds. Relax. Repeat __10__ times per set. Do __1__ sets per session. Do _2___ sessions per day.  Lumbar Rotation: Caudal - Bilateral (Supine)    Feet and knees together, arms outstretched, rotate knees left, turning head in opposite direction, until stretch is felt. Hold _5-10___ seconds. Relax. Repeat to the other side.  Repeat _10___ times per set. Do __1__ sets per session. Do __2__ sessions per day.  Trigger Point Dry Needling  . What is Trigger Point Dry Needling (DN)? o DN is a physical therapy technique used to treat muscle pain and dysfunction. Specifically, DN helps deactivate muscle trigger points (muscle knots).  o A thin filiform needle is used to penetrate the skin and stimulate the underlying trigger point. The goal is for a local twitch response (LTR) to occur and for the trigger point to relax. No medication of any kind is injected during the procedure.   . What Does Trigger Point Dry Needling Feel Like?  o The procedure feels different for each individual patient. Some patients report that they do not actually feel the needle enter the skin and overall the process is not painful. Very mild bleeding may occur. However, many patients feel a deep cramping in the muscle in which the needle was inserted. This is the local twitch response.   [name] How Will I feel after the treatment? o Soreness is normal, and the onset of soreness may not occur for a few hours. Typically this soreness does not last longer than two days.  o Bruising is uncommon, however; ice can be used to decrease any possible bruising.  o In rare cases feeling tired or nauseous after the treatment is normal. In addition, your symptoms may get worse before they get better, this period will typically not last longer than 24 hours.   . What Can I do After  My Treatment? o Increase your hydration by drinking more water for the next 24 hours. o You may place ice or heat on the areas treated that have become sore, however, do not use heat on inflamed or bruised areas. Heat often brings more relief post needling. o You can continue your regular activities, but vigorous activity is not recommended initially after the treatment for 24 hours. o DN is best combined with other physical therapy such as strengthening, stretching, and other therapies.   TENS UNIT: This is helpful for muscle pain and spasm.   Search and Purchase a TENS 7000 2nd edition at www.tenspros.com. It should be less than $30.     TENS unit instructions: Do not shower or bathe with the unit on Turn the unit off before removing electrodes or batteries If the electrodes lose stickiness add a drop of water to the electrodes after they are disconnected from the unit and place on plastic sheet. If you continued to have difficulty, call the TENS unit company to purchase more electrodes. Do not apply lotion on the skin area prior to use. Make sure the skin is clean and dry as this will help prolong the life of the electrodes. After use, always check skin for unusual red areas, rash or other skin difficulties. If there are any skin problems, does not apply electrodes to the same area. Never remove the electrodes from the unit by pulling the wires. Do not use the TENS unit  or electrodes other than as directed. Do not change electrode placement without consultating your therapist or physician. Keep 2 fingers with between each electrode. Wear time ratio is 2:1, on to off times.    For example on for 30 minutes off for 15 minutes and then on for 30 minutes off for 15 minutes   TENS stands for Transcutaneous Electrical Nerve Stimulation. In other words, electrical impulses are allowed to pass through the skin in order to excite a nerve.  Purpose and Use of TENS:  TENS is a method used to  manage acute and chronic pain without the use of drugs. It has been effective in managing pain associated with surgery, sprains, strains, trauma, rheumatoid arthritis, and neuralgias. It is a non-addictive, low risk, and non-invasive technique used to control pain. It is not, by any means, a curative form of treatment.   How TENS Works:  Most TENS units are a Paramedic unit powered by one 9 volt battery. Attached to the outside of the unit are two lead wires where two pins and/or snaps connect on each wire. All units come with a set of four reusable pads or electrodes. These are placed on the skin surrounding the area involved. By inserting the leads into  the pads, the electricity can pass from the unit making the circuit complete.  As the intensity is turned up slowly, the electrical current enters the body from the electrodes through the skin to the surrounding nerve fibers. This triggers the release of hormones from within the body. These hormones contain pain relievers. By increasing the circulation of these hormones, the person's pain may be lessened. It is also believed that the electrical stimulation itself helps to block the pain messages being sent to the brain, thus also decreasing the body's perception of pain.   Hazards:  TENS units are NOT to be used by patients with PACEMAKERS, DEFIBRILLATORS, DIABETIC PUMPS, PREGNANT WOMEN, and patients with SEIZURE DISORDERS.  TENS units are NOT to be used over the heart, throat, brain, or spinal cord.  One of the major side effects from the TENS unit may be skin irritation. Some people may develop a rash if they are sensitive to the materials used in the electrodes or the connecting wires. .   Avoid overuse due the body getting used to the stem making it not as effective over time.

## 2017-03-04 NOTE — Therapy (Signed)
[facility] [address] [place], [place], [address] Phone: [phone]   Fax:  [phone]  Physical Therapy Evaluation  Patient Details  Name: Brittney Young MRN: [id number] Date of Birth: 06-Sep-1971 Referring Provider: Dr Sherley Bounds  Encounter Date: 03/04/2017      PT End of Session - 03/04/17 1517    Visit Number 1   Number of Visits 8   Date for PT Re-Evaluation 04/01/17   PT Start Time 5885   PT Stop Time 1616   PT Time Calculation (min) 59 min      Past Medical History:  Diagnosis Date  . Anxiety   . Depression   . Hypertension     Past Surgical History:  Procedure Laterality Date  . CESAREAN SECTION      There were no vitals filed for this visit.       Subjective Assessment - 03/04/17 1517    Subjective Pt was seen for PT in Nov 2016, had a lamenectomy in 12/16, did well after this.  The pain returned in March of this year and now the pain is into the Lt LE to her knee, then jumps to the top of the Lt ankle.  Not really doing exercise formally , does play with her 28 year old son.    Pertinent History lumbar laminectomy 12/16   How long can you sit comfortably? 20 min   How long can you walk comfortably? 25 minutes   Diagnostic tests x-ray - disc protrusion   Patient Stated Goals hoping to avoid surgery.    Currently in Pain? Yes   Pain Score 8    Pain Location Back   Pain Orientation Left   Pain Descriptors / Indicators Aching;Stabbing   Pain Type Acute pain   Pain Radiating Towards into Lt LE    Pain Onset More than a month ago   Pain Frequency Constant   Aggravating Factors  walking and house hold chores   Pain Relieving Factors medication            [facility] PT Assessment - 03/04/17 0001      Assessment   Medical Diagnosis lumbar radiculopathy   Referring Provider Dr Sherley Bounds   Onset Date/Surgical Date 08/28/15   Hand Dominance Right   Next MD Visit 03/23/17   Prior  Therapy November 2016     Precautions   Precautions None   Required Braces or Orthoses --  has heel lift in Left shoe     Balance Screen   Has the patient fallen in the past 6 months No     [place] residence   Ridge to enter  has some trouble on the entry steps when fatgiued.    Home Layout One level     Prior Function   Level of Independence Independent   Vocation Full time employment   Vocation Requirements [employer], on feet all shift 8 hrs   Leisure playing with son,      Observation/Other Assessments   Focus on Therapeutic Outcomes (FOTO)  53% limited     Sensation   Light Touch Appears Intact   Hot/Cold Appears Intact   Additional Comments some intermittent numbness/tingling in Lt LE      Functional Tests   Functional tests Squat;Single leg stance     Squat   Comments WNL, Lt side feels heavy when doing this  Single Leg Stance   Comments bilat WNL, some increased pain in Lt buttocks with Lt side.      Posture/Postural Control   Posture/Postural Control Postural limitations   Postural Limitations Rounded Shoulders;Forward head;Increased lumbar lordosis;Weight shift left     ROM / Strength   AROM / PROM / Strength AROM;Strength     AROM   AROM Assessment Site Lumbar   Lumbar Flexion 90% some Lt low back pain   Lumbar Extension WNL   Lumbar - Right Side Bend WNL   Lumbar - Left Side Bend WNL, some Lt LBP   Lumbar - Right Rotation WNL some Lt LBP   Lumbar - Left Rotation  WNL     Strength   Strength Assessment Site Hip;Knee;Ankle;Lumbar   Right/Left Hip Left  Rt WNL   Left Hip Flexion --  5-/5   Left Hip Extension 4/5   Left Hip ABduction 4+/5   Right/Left Knee --  Rt WNL, Lt grossly 5-/5   Right/Left Ankle --  bilat WNL   Lumbar Flexion --  TA poor   Lumbar Extension --  multifidi fair     Flexibility   Soft Tissue Assessment /Muscle Length  yes   Hamstrings bilat WNL ~ 90 degrees   Quadriceps bilat WNL     Palpation   Spinal mobility hypomobile L3-4 CPA UPA Lt L2-4, Rt L4 with tenderness.    Palpation comment tender in Lt upper glut, tight in bilat upper lumbar paraspinals and Lt gluts.      Special Tests    Special Tests --  (-) slump, SLR and FAbers             Objective measurements completed on examination: See above findings.          [facility] Adult PT Treatment/Exercise - 03/04/17 0001      Exercises   Exercises Lumbar     Lumbar Exercises: Stretches   Lower Trunk Rotation 5 reps     Lumbar Exercises: Supine   Ab Set 10 reps  with posterior pelvic tilt, manual therapy - grade III mobs Lt UPA L4, - no change in symptoms.        Modalities   Modalities Electrical Stimulation;Moist Heat     Moist Heat Therapy   Number Minutes Moist Heat 20 Minutes   Moist Heat Location Lumbar Spine     Electrical Stimulation   Electrical Stimulation Location Lt lumbar/buttocks   Electrical Stimulation Action IFC   Electrical Stimulation Parameters  to tolerance   Electrical Stimulation Goals Pain;Tone                PT Education - 03/04/17 1624    Education provided Yes   Education Details HEP   Methods Explanation;Demonstration;Handout   Comprehension Returned demonstration;Verbalized understanding             PT Long Term Goals - 03/04/17 1613      PT LONG TERM GOAL #1   Title I with advanced HEP ( 7/27/168    Time 4   Period Weeks   Status New     PT LONG TERM GOAL #2   Title report pain reduction =/> 75% with standing and walking to allow return to work ( 04/03/17)    Time 4   Period Weeks   Status New     PT LONG TERM GOAL #3   Title demo strong contraction of TA and multifidi with core work ( 04/03/17)    Time 4  Period Weeks   Status New     PT LONG TERM GOAL #4   Title Decrease FOTO to </=  40% limitation (04/03/17)    Time 4   Period Weeks   Status New     PT  LONG TERM GOAL #5   Title lift up to 50# floor to waist without increased pain to assist with return to work ( 04/03/17)    Time 4   Period Weeks   Status New                Plan - 03/04/17 1617    Clinical Impression Statement 46 yo female presents with 4 month h/o Lt sided LBP with radiculopathy into the Lt LE. She has failed two injections in this time and is out of work on short term disability.  She wishes to avoid another surgery.  She has tightness in the Lt upper gluts and low back along with hypomobility in the lower lumbar spine.  She is weak in her  core as well.  Would benefit from therapy to increase lumbar mobility and strength and decrease pain/ symptoms into her Lt LE   Clinical Presentation Evolving   Clinical Decision Making Low   Rehab Potential Good   PT Frequency 2x / week   PT Duration 4 weeks   PT Treatment/Interventions Moist Heat;Traction;Ultrasound;Therapeutic exercise;Dry needling;Manual techniques;Neuromuscular re-education;Cryotherapy;Electrical Stimulation;Iontophoresis 4mg /ml Dexamethasone;Patient/family education   PT Next Visit Plan possible DN if patient open to this, trial of lumbar traction, spinal mobs PRN, sore stabilization   Consulted and Agree with Plan of Care Patient      Patient will benefit from skilled therapeutic intervention in order to improve the following deficits and impairments:  Difficulty walking, Increased muscle spasms, Pain, Hypomobility, Decreased strength, Postural dysfunction  Visit Diagnosis: Acute left-sided low back pain with left-sided sciatica - Plan: PT plan of care cert/re-cert  Muscle weakness (generalized) - Plan: PT plan of care cert/re-cert  Abnormal posture - Plan: PT plan of care cert/re-cert     Problem List Patient Active Problem List   Diagnosis Date Noted  . Dehydration 10/03/2016  . Severe episode of recurrent major depressive disorder, without psychotic features ([place]) 10/03/2016  . Grief  reaction 10/03/2016  . TMJ (temporomandibular joint syndrome) 05/07/2016  . Cephalalgia 05/07/2016  . Knee pain, left 09/04/2015  . Postlaminectomy syndrome of lumbar region 04/02/2015  . Anxiety and depression 12/18/2014  . Anemia 07/03/2014  . Vitamin D deficiency 07/03/2014  . Other fatigue 07/03/2014  . Essential hypertension, benign 06/30/2014  . Genital herpes 06/30/2014    Jeral Pinch PT  03/04/2017, 4:47 PM  [facility] [address], [place], [address] Phone: [phone]   Fax:  [phone]  Name: Brittney Young MRN: [id number] Date of Birth: Feb 18, 1971

## 2017-03-09 ENCOUNTER — Ambulatory Visit (INDEPENDENT_AMBULATORY_CARE_PROVIDER_SITE_OTHER): Payer: BLUE CROSS/BLUE SHIELD | Admitting: Physical Therapy

## 2017-03-09 DIAGNOSIS — R293 Abnormal posture: Secondary | ICD-10-CM

## 2017-03-09 DIAGNOSIS — M6281 Muscle weakness (generalized): Secondary | ICD-10-CM | POA: Diagnosis not present

## 2017-03-09 DIAGNOSIS — M5442 Lumbago with sciatica, left side: Secondary | ICD-10-CM

## 2017-03-09 NOTE — Therapy (Signed)
[place], [place], [address] Phone: [phone]   Fax:  [phone]  Physical Therapy Treatment  Patient Details  Name: Brittney Young MRN: [id number] Date of Birth: [birth year] Referring Provider: Dr. Sherley Bounds  Encounter Date: 03/09/2017      PT End of Session - 03/09/17 1440    Visit Number 2   Number of Visits 8   Date for PT Re-Evaluation 04/01/17   PT Start Time 2229   PT Stop Time 1526   PT Time Calculation (min) 53 min      Past Medical History:  Diagnosis Date  . Anxiety   . Depression   . Hypertension     Past Surgical History:  Procedure Laterality Date  . CESAREAN SECTION      There were no vitals filed for this visit.      Subjective Assessment - 03/09/17 1444    Subjective Pt reports she feels worse than last visit.  She believes some of exercises are aggrivating condition. She has been using heat for pain relief.    Currently in Pain? Yes   Pain Score 7    Pain Location Back   Pain Orientation Left   Pain Descriptors / Indicators Aching   Pain Radiating Towards down leg into ankle.    Aggravating Factors  walking, chores like mopping / vacuuming    Pain Relieving Factors heat            [facility] PT Assessment - 03/09/17 0001      Assessment   Medical Diagnosis lumbar radiculopathy   Referring Provider Dr. Sherley Bounds   Onset Date/Surgical Date 08/28/15   Hand Dominance Right   Next MD Visit to be scheduled.           [facility] Adult PT Treatment/Exercise - 03/09/17 0001      Lumbar Exercises: Stretches   Passive Hamstring Stretch 2 reps;30 seconds  supine with strap   Lower Trunk Rotation --  d/c'd from HEP due to increased pain with this ex.    Prone on Elbows Stretch 5 reps;20 seconds     Lumbar Exercises: Aerobic   Stationary Bike NuStep L5: (arms/legs) x 5 min      Lumbar Exercises: Prone   Other Prone Lumbar Exercises pelvic press with  tactile cues, demo and VC - Pt had difficulty initiating with good form (8 reps attempted)     Modalities   Modalities Electrical Stimulation;Cryotherapy     Cryotherapy   Number Minutes Cryotherapy 15 Minutes   Cryotherapy Location Lumbar Spine   Type of Cryotherapy Ice pack     Electrical Stimulation   Electrical Stimulation Location bilat lumbar paraspinals    Electrical Stimulation Action IFC   Electrical Stimulation Parameters to tolerance    Electrical Stimulation Goals Pain     Manual Therapy   Manual Therapy Soft tissue mobilization   Manual therapy comments Pt point tender in Lt SI joint and L5 with light touch.    Soft tissue mobilization STM to bilat glutes, Lt piriformis.            PT Long Term Goals - 03/04/17 1613      PT LONG TERM GOAL #1   Title I with advanced HEP ( 7/27/168    Time 4   Period Weeks   Status New     PT LONG TERM GOAL #2   Title report pain reduction =/> 75% with standing and walking to  allow return to work ( 04/03/17)    Time 4   Period Weeks   Status New     PT LONG TERM GOAL #3   Title demo strong contraction of TA and multifidi with core work ( 04/03/17)    Time 4   Period Weeks   Status New     PT LONG TERM GOAL #4   Title Decrease FOTO to </=  40% limitation (04/03/17)    Time 4   Period Weeks   Status New     PT LONG TERM GOAL #5   Title lift up to 50# floor to waist without increased pain to assist with return to work ( 04/03/17)    Time 4   Period Weeks   Status New               Plan - 03/09/17 1514    Clinical Impression Statement Pt reported pain with lumbar rotation and flexion; switched exercises to ext based.  Pt reported reduction of LBP and radicular symptoms in supine position and in Rt sidelying stretch over bolster.  Pt had difficulty initiating pelvic press despite multiple cues.    Rehab Potential Good   PT Frequency 2x / week   PT Duration 4 weeks   PT Treatment/Interventions Moist  Heat;Traction;Ultrasound;Therapeutic exercise;Dry needling;Manual techniques;Neuromuscular re-education;Cryotherapy;Electrical Stimulation;Iontophoresis 4mg /ml Dexamethasone;Patient/family education   PT Next Visit Plan possible DN if patient open to this, trial of lumbar traction, spinal mobs PRN, sore stabilization   Consulted and Agree with Plan of Care Patient      Patient will benefit from skilled therapeutic intervention in order to improve the following deficits and impairments:  Difficulty walking, Increased muscle spasms, Pain, Hypomobility, Decreased strength, Postural dysfunction  Visit Diagnosis: Acute left-sided low back pain with left-sided sciatica  Muscle weakness (generalized)  Abnormal posture     Problem List Patient Active Problem List   Diagnosis Date Noted  . Dehydration 10/03/2016  . Severe episode of recurrent major depressive disorder, without psychotic features ([place]) 10/03/2016  . Grief reaction 10/03/2016  . TMJ (temporomandibular joint syndrome) 05/07/2016  . Cephalalgia 05/07/2016  . Knee pain, left 09/04/2015  . Postlaminectomy syndrome of lumbar region 04/02/2015  . Anxiety and depression 12/18/2014  . Anemia 07/03/2014  . Vitamin D deficiency 07/03/2014  . Other fatigue 07/03/2014  . Essential hypertension, benign 06/30/2014  . Genital herpes 06/30/2014   Kerin Perna, PTA 03/09/17 5:10 PM  [place], [place], [address] Phone: [phone]   Fax:  [phone]  Name: ARAMINTA ZORN MRN: [id number] Date of Birth: Mar 08, 1971

## 2017-03-12 ENCOUNTER — Ambulatory Visit (INDEPENDENT_AMBULATORY_CARE_PROVIDER_SITE_OTHER): Payer: BLUE CROSS/BLUE SHIELD | Admitting: Physical Therapy

## 2017-03-12 DIAGNOSIS — M5442 Lumbago with sciatica, left side: Secondary | ICD-10-CM | POA: Diagnosis not present

## 2017-03-12 DIAGNOSIS — R293 Abnormal posture: Secondary | ICD-10-CM

## 2017-03-12 DIAGNOSIS — M6281 Muscle weakness (generalized): Secondary | ICD-10-CM | POA: Diagnosis not present

## 2017-03-12 NOTE — Therapy (Signed)
[place], [place], [address] Phone: [phone]   Fax:  [phone]  Physical Therapy Treatment  Patient Details  Name: Brittney Young MRN: [id number] Date of Birth: [birth year] Referring Provider: Dr. Sherley Bounds  Encounter Date: 03/12/2017      PT End of Session - 03/12/17 1531    Visit Number 3   Number of Visits 8   Date for PT Re-Evaluation 04/01/17   PT Start Time 3299   PT Stop Time 1622   PT Time Calculation (min) 51 min   Activity Tolerance Patient tolerated treatment well      Past Medical History:  Diagnosis Date  . Anxiety   . Depression   . Hypertension     Past Surgical History:  Procedure Laterality Date  . CESAREAN SECTION      There were no vitals filed for this visit.      Subjective Assessment - 03/12/17 1531    Currently in Pain? Yes   Pain Score 4    Pain Location Back   Pain Orientation Left;Lower   Pain Descriptors / Indicators Aching   Pain Type Acute pain   Pain Onset More than a month ago   Pain Frequency Constant                         [facility] Adult PT Treatment/Exercise - 03/12/17 0001      Lumbar Exercises: Aerobic   Stationary Bike NuStep L5: (arms/legs) x 5 min      Lumbar Exercises: Prone   Opposite Arm/Leg Raise Left arm/Right leg;Right arm/Left leg;10 reps     Modalities   Modalities Electrical Stimulation;Moist Heat     Moist Heat Therapy   Number Minutes Moist Heat 20 Minutes   Moist Heat Location Lumbar Spine     Electrical Stimulation   Electrical Stimulation Location bilat lumbar paraspinals    Electrical Stimulation Action IFC    Electrical Stimulation Parameters to tolerance   Electrical Stimulation Goals Tone;Pain     Manual Therapy   Manual Therapy Joint mobilization;Soft tissue mobilization   Joint Mobilization lumbar CPA and Lt UPA mobs grade II-III   Soft tissue mobilization STM to Lt gluts,  piriformis, QL and lumbar paraspinals.           Trigger Point Dry Needling - 03/12/17 1537    Consent Given? Yes   Education Handout Provided Yes   Muscles Treated Lower Body Gluteus maximus;Piriformis;Gluteus minimus  Lt side   Gluteus Maximus Response Palpable increased muscle length;Twitch response elicited   Gluteus Minimus Response Palpable increased muscle length;Twitch response elicited   Piriformis Response Palpable increased muscle length;Twitch response elicited                   PT Long Term Goals - 03/04/17 1613      PT LONG TERM GOAL #1   Title I with advanced HEP ( 7/27/168    Time 4   Period Weeks   Status New     PT LONG TERM GOAL #2   Title report pain reduction =/> 75% with standing and walking to allow return to work ( 04/03/17)    Time 4   Period Weeks   Status New     PT LONG TERM GOAL #3   Title demo strong contraction of TA and multifidi with core work ( 04/03/17)    Time 4   Period [place]  Status New     PT LONG TERM GOAL #4   Title Decrease FOTO to </=  40% limitation (04/03/17)    Time 4   Period Weeks   Status New     PT LONG TERM GOAL #5   Title lift up to 50# floor to waist without increased pain to assist with return to work ( 04/03/17)    Time [place] - 03/12/17 1604    Clinical Impression Statement Brittney Young had some good releases with manual work and DN, would benefit from this to the Lt QL.  She was tender with Lt UPA mobs at L4.     Rehab Potential Good   PT Frequency 2x / week   PT Duration 4 weeks   PT Treatment/Interventions Moist Heat;Traction;Ultrasound;Therapeutic exercise;Dry needling;Manual techniques;Neuromuscular re-education;Cryotherapy;Electrical Stimulation;Iontophoresis 4mg /ml Dexamethasone;Patient/family education   PT Next Visit Plan assess response to DN, if no improvement try lumbar traction.    Consulted and Agree with Plan of Care Patient       Patient will benefit from skilled therapeutic intervention in order to improve the following deficits and impairments:  Difficulty walking, Increased muscle spasms, Pain, Hypomobility, Decreased strength, Postural dysfunction  Visit Diagnosis: Acute left-sided low back pain with left-sided sciatica  Muscle weakness (generalized)  Abnormal posture     Problem List Patient Active Problem List   Diagnosis Date Noted  . Dehydration 10/03/2016  . Severe episode of recurrent major depressive disorder, without psychotic features ([place]) 10/03/2016  . Grief reaction 10/03/2016  . TMJ (temporomandibular joint syndrome) 05/07/2016  . Cephalalgia 05/07/2016  . Knee pain, left 09/04/2015  . Postlaminectomy syndrome of lumbar region 04/02/2015  . Anxiety and depression 12/18/2014  . Anemia 07/03/2014  . Vitamin D deficiency 07/03/2014  . Other fatigue 07/03/2014  . Essential hypertension, benign 06/30/2014  . Genital herpes 06/30/2014    Jeral Pinch PT 03/12/2017, 4:07 PM  [facility] [place], [place], [address] Phone: [phone]   Fax:  [phone]  Name: Brittney Young MRN: [id number] Date of Birth: 1971-01-27

## 2017-03-16 ENCOUNTER — Encounter: Payer: Self-pay | Admitting: Physical Therapy

## 2017-03-16 ENCOUNTER — Ambulatory Visit (INDEPENDENT_AMBULATORY_CARE_PROVIDER_SITE_OTHER): Payer: BLUE CROSS/BLUE SHIELD | Admitting: Physical Therapy

## 2017-03-16 DIAGNOSIS — R293 Abnormal posture: Secondary | ICD-10-CM | POA: Diagnosis not present

## 2017-03-16 DIAGNOSIS — R52 Pain, unspecified: Secondary | ICD-10-CM | POA: Diagnosis not present

## 2017-03-16 DIAGNOSIS — M5442 Lumbago with sciatica, left side: Secondary | ICD-10-CM | POA: Diagnosis not present

## 2017-03-16 DIAGNOSIS — M6281 Muscle weakness (generalized): Secondary | ICD-10-CM

## 2017-03-16 NOTE — Therapy (Addendum)
[place], [place], [address] Phone: [phone]   Fax:  [phone]  Physical Therapy Treatment  Patient Details  Name: Brittney Young MRN: [id number] Date of Birth: July 13, 1971 Referring Provider: Dr. Sherley Bounds  Encounter Date: 03/16/2017      PT End of Session - 03/16/17 1430    Visit Number 4   Number of Visits 8   Date for PT Re-Evaluation 04/01/17   PT Start Time 1430   PT Stop Time 3419   PT Time Calculation (min) 44 min   Activity Tolerance Patient limited by pain      Past Medical History:  Diagnosis Date  . Anxiety   . Depression   . Hypertension     Past Surgical History:  Procedure Laterality Date  . CESAREAN SECTION      There were no vitals filed for this visit.      Subjective Assessment - 03/16/17 1434    Subjective Brittney Young reports her whole body felt sore this weekend. WAs a little sore after last visit. She is not sore if DN helped as she was so sore over the weekend,  Is interested in tryng traction today . She also thinks she is feeling weakness in the Lt LE    Patient Stated Goals hoping to avoid surgery.    Currently in Pain? Yes   Pain Score 5    Pain Location Back   Pain Orientation Left   Pain Type Acute pain   Pain Radiating Towards to top of Lt foot and back of ankle   Pain Onset More than a month ago   Pain Frequency Constant   Aggravating Factors  walking   Pain Relieving Factors heat             [facility] PT Assessment - 03/16/17 0001      Assessment   Medical Diagnosis lumbar radiculopathy     Strength   Right/Left Hip --  Rt WNL except abduct 4+/5   Left Hip Flexion 4+/5   Left Hip Extension 4/5   Left Hip ABduction 4-/5                     [facility] Adult PT Treatment/Exercise - 03/16/17 0001      Lumbar Exercises: Aerobic   Stationary Bike L2x5'  stopped at 4.5 due to 7/10 back pain     Lumbar Exercises: Supine   Bridge 20 reps  with knees in/out green band     Lumbar Exercises: Sidelying   Clam 20 reps  reverse clam each side.      Lumbar Exercises: Prone   Other Prone Lumbar Exercises POE x 60 sec with some decrease in pain, 5 press ups.      Modalities   Modalities Traction     Traction   Type of Traction Lumbar  prone, 200# pt wt    Min (lbs) 45   Max (lbs) 35   Hold Time 60   Rest Time 10   Time 15                     PT Long Term Goals - 03/16/17 1437      PT LONG TERM GOAL #1   Title I with advanced HEP ( 7/27/168    Status On-going     PT LONG TERM GOAL #2   Title report pain reduction =/> 75% with standing and walking to allow return  to work ( 04/03/17)    Status On-going     PT LONG TERM GOAL #3   Title demo strong contraction of TA and multifidi with core work ( 04/03/17)    Status On-going     PT LONG TERM GOAL #4   Title Decrease FOTO to </=  40% limitation (04/03/17)    Status On-going     PT LONG TERM GOAL #5   Title lift up to 50# floor to waist without increased pain to assist with return to work ( 04/03/17)    Status On-going               Plan - 03/16/17 1448    Clinical Impression Statement This is Brittney Young's 4th visit, she is having increased symptoms in her LE's and some into the Rt side now also. She is not sure if DN helped as she had a change in her pain over the weekend.  Since she did get some relief of symptoms with prone on elbows trial of traction will happen.  No goals met, has a little more weakness in the Lt LE as compared to initial eval.    Rehab Potential Good   PT Frequency 2x / week   PT Duration 4 weeks   PT Treatment/Interventions Moist Heat;Traction;Ultrasound;Therapeutic exercise;Dry needling;Manual techniques;Neuromuscular re-education;Cryotherapy;Electrical Stimulation;Iontophoresis 26m/ml Dexamethasone;Patient/family education   PT Next Visit Plan another session of lumbar traction before she sees the MD on Monday,  Core stabilization.    Consulted and Agree with Plan of Care Patient      Patient will benefit from skilled therapeutic intervention in order to improve the following deficits and impairments:  Difficulty walking, Increased muscle spasms, Pain, Hypomobility, Decreased strength, Postural dysfunction  Visit Diagnosis: Acute left-sided low back pain with left-sided sciatica  Muscle weakness (generalized)  Abnormal posture  Pain     Problem List Patient Active Problem List   Diagnosis Date Noted  . Dehydration 10/03/2016  . Severe episode of recurrent major depressive disorder, without psychotic features ([place] 10/03/2016  . Grief reaction 10/03/2016  . TMJ (temporomandibular joint syndrome) 05/07/2016  . Cephalalgia 05/07/2016  . Knee pain, left 09/04/2015  . Postlaminectomy syndrome of lumbar region 04/02/2015  . Anxiety and depression 12/18/2014  . Anemia 07/03/2014  . Vitamin D deficiency 07/03/2014  . Other fatigue 07/03/2014  . Essential hypertension, benign 06/30/2014  . Genital herpes 06/30/2014    SJeral PinchPT  03/16/2017, 4:03 PM  [facility] [address]Phone: [phone]  Fax:  [phone] Name: Brittney MOUNSEYMRN: [id number] of Birth: [birth year]

## 2017-03-16 NOTE — Patient Instructions (Signed)
Back Hyperextension: Using Arms    Lying face down with arms bent, inhale. Then while exhaling, straighten arms. Hold _1-2___ seconds. Slowly return to starting position. Repeat _5___ times per set. Do __1__ sets per session. Do __3__ sessions per day.   On Elbows (Prone)    Rise up on elbows as high as possible, keeping hips on floor. Hold __60-120__ seconds. Repeat __1__ times per set. Do _1___ sets per session. Do _3___ sessions per day.

## 2017-03-19 ENCOUNTER — Ambulatory Visit (INDEPENDENT_AMBULATORY_CARE_PROVIDER_SITE_OTHER): Payer: BLUE CROSS/BLUE SHIELD | Admitting: Physical Therapy

## 2017-03-19 DIAGNOSIS — R293 Abnormal posture: Secondary | ICD-10-CM

## 2017-03-19 DIAGNOSIS — M6281 Muscle weakness (generalized): Secondary | ICD-10-CM

## 2017-03-19 DIAGNOSIS — M5442 Lumbago with sciatica, left side: Secondary | ICD-10-CM | POA: Diagnosis not present

## 2017-03-19 NOTE — Therapy (Addendum)
[place], [place], [address] Phone: [phone]   Fax:  [phone]  Physical Therapy Treatment  Patient Details  Name: Brittney Young MRN: [id number] Date of Birth: 01-29-1971 Referring Provider: Dr. Sherley Bounds  Encounter Date: 03/19/2017      PT End of Session - 03/19/17 1403    Visit Number 5   Number of Visits 8   Date for PT Re-Evaluation 04/01/17   PT Start Time 1401   PT Stop Time 1515   PT Time Calculation (min) 74 min   Activity Tolerance Patient limited by pain      Past Medical History:  Diagnosis Date  . Anxiety   . Depression   . Hypertension     Past Surgical History:  Procedure Laterality Date  . CESAREAN SECTION      There were no vitals filed for this visit.      Subjective Assessment - 03/19/17 1405    Subjective Brittney Young reports pain in her left lower back. She states that the discomfort from traction last treatment subsided shortly after leaving. Brittney Young had a significant increase in pain after traction treatment today.   Pertinent History lumbar laminectomy 12/16   How long can you sit comfortably? 20 min   How long can you walk comfortably? 25 minutes   Diagnostic tests x-ray - disc protrusion   Patient Stated Goals hoping to avoid surgery.    Currently in Pain? Yes   Pain Score 3    Pain Location Back   Pain Orientation Left            [facility] PT Assessment - 03/19/17 0001      Assessment   Medical Diagnosis lumbar radiculopathy   Referring Provider Dr. Sherley Bounds   Onset Date/Surgical Date 08/28/15   Hand Dominance Right   Next MD Visit to be scheduled.                      [facility] Adult PT Treatment/Exercise - 03/19/17 0001      Lumbar Exercises: Stretches   Prone on Elbows Stretch 1 rep;60 seconds   Press Ups 2 reps;20 seconds     Lumbar Exercises: Aerobic   Stationary Bike L2x5'     Lumbar Exercises: Supine   Clam 10  reps  with ab set   Bent Knee Raise 15 reps  with ab set   Bridge 10 reps  with knees in/out green band   Other Supine Lumbar Exercises unilateral clam with green band x 10 each leg     Lumbar Exercises: Quadruped   [place] 5 reps   Opposite Arm/Leg Raise Right arm/Left leg;Left arm/Right leg;10 reps     Modalities   Modalities Traction     Cryotherapy   Number Minutes Cryotherapy 15 Minutes   Cryotherapy Location Lumbar Spine   Type of Cryotherapy Ice pack     Electrical Stimulation   Electrical Stimulation Location bilat lumbar paraspinals   15 min.,   Electrical Stimulation Action IFC   Electrical Stimulation Parameters to tolerance   Electrical Stimulation Goals Pain     Traction   Type of Traction Lumbar  supine, 200# pt wt    Min (lbs) 35   Max (lbs) 50   Hold Time 60   Rest Time 20   Time 15                     PT  Long Term Goals - 03/19/17 1716      PT LONG TERM GOAL #1   Title I with advanced HEP ( 7/27/168    Time 4   Period Weeks   Status On-going     PT LONG TERM GOAL #2   Title report pain reduction =/> 75% with standing and walking to allow return to work ( 04/03/17)    Time 4   Period Weeks   Status On-going     PT LONG TERM GOAL #3   Title demo strong contraction of TA and multifidi with core work ( 04/03/17)    Time 4   Period Weeks   Status On-going     PT LONG TERM GOAL #4   Title Decrease FOTO to </=  40% limitation (04/03/17)    Time 4   Period Weeks   Status On-going     PT LONG TERM GOAL #5   Title lift up to 50# floor to waist without increased pain to assist with return to work ( 04/03/17)    Time 4   Period Weeks   Status On-going               Plan - 03/19/17 1519    Clinical Impression Statement Brittney Young tolerated treatment well, with no increase in pain with exercise, but 4 point  increase in pain following traction. Estim and ice applied to pt to alleviate pain following traction. Pt mentioned  that during last traction treatment, she feared she might fall through the table; may have caused her to tighten up during last traction.  Pt progressing towards goals, with improved TA contraction this visit.    Rehab Potential Good   PT Frequency 2x / week   PT Duration 4 weeks   PT Treatment/Interventions Moist Heat;Traction;Ultrasound;Therapeutic exercise;Dry needling;Manual techniques;Neuromuscular re-education;Cryotherapy;Electrical Stimulation;Iontophoresis 4mg /ml Dexamethasone;Patient/family education   PT Next Visit Plan Core stabilization, stretch. Assess response to supine traction.   Consulted and Agree with Plan of Care Patient      Patient will benefit from skilled therapeutic intervention in order to improve the following deficits and impairments:  Difficulty walking, Increased muscle spasms, Pain, Hypomobility, Decreased strength, Postural dysfunction  Visit Diagnosis: Acute left-sided low back pain with left-sided sciatica  Muscle weakness (generalized)  Abnormal posture     Problem List Patient Active Problem List   Diagnosis Date Noted  . Dehydration 10/03/2016  . Severe episode of recurrent major depressive disorder, without psychotic features ([place]) 10/03/2016  . Grief reaction 10/03/2016  . TMJ (temporomandibular joint syndrome) 05/07/2016  . Cephalalgia 05/07/2016  . Knee pain, left 09/04/2015  . Postlaminectomy syndrome of lumbar region 04/02/2015  . Anxiety and depression 12/18/2014  . Anemia 07/03/2014  . Vitamin D deficiency 07/03/2014  . Other fatigue 07/03/2014  . Essential hypertension, benign 06/30/2014  . Genital herpes 06/30/2014    Andria Meuse, SPTA 03/19/2017, 5:17 PM  During this treatment session, the therapist was present, participating in and directing the treatment. Kerin Perna, PTA 03/19/17 5:25 PM  [place], [place], [address] Phone:  [phone]   Fax:  [phone]  Name: Brittney Young MRN: [id number] Date of Birth: [birth year]

## 2017-03-25 ENCOUNTER — Ambulatory Visit (INDEPENDENT_AMBULATORY_CARE_PROVIDER_SITE_OTHER): Payer: BLUE CROSS/BLUE SHIELD | Admitting: Physical Therapy

## 2017-03-25 DIAGNOSIS — M5442 Lumbago with sciatica, left side: Secondary | ICD-10-CM

## 2017-03-25 DIAGNOSIS — M6281 Muscle weakness (generalized): Secondary | ICD-10-CM

## 2017-03-25 DIAGNOSIS — R293 Abnormal posture: Secondary | ICD-10-CM | POA: Diagnosis not present

## 2017-03-25 NOTE — Therapy (Signed)
[place], [place], [address] Phone: [phone]   Fax:  [phone]  Physical Therapy Treatment  Patient Details  Name: Brittney Young MRN: [id number] Date of Birth: 1971/05/08 Referring Provider: Dr. Sherley Bounds  Encounter Date: 03/25/2017      PT End of Session - 03/25/17 1409    Visit Number 6   Number of Visits 8   Date for PT Re-Evaluation 04/01/17   PT Start Time 1402   PT Stop Time 1455   PT Time Calculation (min) 53 min   Activity Tolerance Patient limited by pain   Behavior During Therapy [facility] for tasks assessed/performed      Past Medical History:  Diagnosis Date  . Anxiety   . Depression   . Hypertension     Past Surgical History:  Procedure Laterality Date  . CESAREAN SECTION      There were no vitals filed for this visit.      Subjective Assessment - 03/25/17 1405    Subjective Camellia reports she was sore after last treatment for a day.  She was shaving in shower on Sunday (3 days ago) with foot up on step - she suddenly felt a pop across back and was unable to stand up straight (pain up to 10/10).   She has been using TENS unit and ice for pain relief.    Pertinent History lumbar laminectomy 12/16   Diagnostic tests x-ray - disc protrusion   Patient Stated Goals hoping to avoid surgery.    Currently in Pain? Yes   Pain Score 6    Pain Location Back   Pain Orientation Right;Left;Lower   Pain Descriptors / Indicators Aching   Pain Radiating Towards top of Lt foot.    Aggravating Factors  bending over, walking long distance   Pain Relieving Factors medicine, ice             [facility] PT Assessment - 03/25/17 0001      Assessment   Medical Diagnosis lumbar radiculopathy   Referring Provider Dr. Sherley Bounds   Onset Date/Surgical Date 08/28/15   Hand Dominance Right   Next MD Visit to be scheduled.           [facility] Adult PT Treatment/Exercise - 03/25/17  0001      Lumbar Exercises: Stretches   Passive Hamstring Stretch 3 reps;30 seconds  supine with strap   Quadruped Mid Back Stretch 4 reps  limited range, to tolerance   Piriformis Stretch 2 reps;30 seconds  each leg     Lumbar Exercises: Prone   Straight Leg Raise 10 reps;1 second  limited ext, within tolerance, with core engaged.    Other Prone Lumbar Exercises POE x 30 sec x 5 reps;  pelvic press with multiple cues for proper engagement of multifidi, 5 sec hold x 5 reps then with knee flex/ext x 5 reps.      Moist Heat Therapy   Number Minutes Moist Heat 15 Minutes   Moist Heat Location Hip  Lt glute, hamstring     Cryotherapy   Number Minutes Cryotherapy 15 Minutes   Cryotherapy Location Lumbar Spine   Type of Cryotherapy Ice pack     Electrical Stimulation   Electrical Stimulation Location lumbar paraspinals; Lt glute   Electrical Stimulation Action IFC   Electrical Stimulation Parameters to tolerance    Electrical Stimulation Goals Pain     Manual Therapy   Manual therapy comments pt in prone position  Soft tissue mobilization STM to Lt gluts, piriformis, QL and lumbar paraspinals.             PT Long Term Goals - 03/25/17 1410      PT LONG TERM GOAL #1   Title I with advanced HEP ( 7/27/168    Time 4   Period Weeks   Status On-going     PT LONG TERM GOAL #2   Title report pain reduction =/> 75% with standing and walking to allow return to work ( 04/03/17)    Time 4   Period Weeks   Status On-going     PT LONG TERM GOAL #3   Title demo strong contraction of TA and multifidi with core work ( 04/03/17)    Time 4   Period Weeks   Status On-going     PT LONG TERM GOAL #4   Title Decrease FOTO to </=  40% limitation (04/03/17)    Time 4   Period Weeks   Status On-going     PT LONG TERM GOAL #5   Title lift up to 50# floor to waist without increased pain to assist with return to work ( 04/03/17)    Time 4   Period Weeks   Status On-going                Plan - 03/25/17 1502    Clinical Impression Statement Pt didn't tolerate supine traction last session; traction held this date. She required multiple cues to engage mulitifidi for prone pelvic press.  She reported pain in low back decreased to 0-1/10 and post Lt hip to 2/10 at end of session.  Limited progress towards goals.    Rehab Potential Good   PT Frequency 2x / week   PT Duration 4 weeks   PT Treatment/Interventions Moist Heat;Traction;Ultrasound;Therapeutic exercise;Dry needling;Manual techniques;Neuromuscular re-education;Cryotherapy;Electrical Stimulation;Iontophoresis 4mg /ml Dexamethasone;Patient/family education   PT Next Visit Plan Core stabilization, posture re-education.  (Pt nearing end of POC.)    Consulted and Agree with Plan of Care Patient      Patient will benefit from skilled therapeutic intervention in order to improve the following deficits and impairments:  Difficulty walking, Increased muscle spasms, Pain, Hypomobility, Decreased strength, Postural dysfunction  Visit Diagnosis: Acute left-sided low back pain with left-sided sciatica  Muscle weakness (generalized)  Abnormal posture     Problem List Patient Active Problem List   Diagnosis Date Noted  . Dehydration 10/03/2016  . Severe episode of recurrent major depressive disorder, without psychotic features ([place]) 10/03/2016  . Grief reaction 10/03/2016  . TMJ (temporomandibular joint syndrome) 05/07/2016  . Cephalalgia 05/07/2016  . Knee pain, left 09/04/2015  . Postlaminectomy syndrome of lumbar region 04/02/2015  . Anxiety and depression 12/18/2014  . Anemia 07/03/2014  . Vitamin D deficiency 07/03/2014  . Other fatigue 07/03/2014  . Essential hypertension, benign 06/30/2014  . Genital herpes 06/30/2014   Brittney Young, PTA 03/25/17 3:07 PM  [place], [place], [address] Phone:  [phone]   Fax:  [phone]  Name: Brittney Young MRN: [id number] Date of Birth: November 01, 1970

## 2017-04-01 ENCOUNTER — Ambulatory Visit (INDEPENDENT_AMBULATORY_CARE_PROVIDER_SITE_OTHER): Payer: BLUE CROSS/BLUE SHIELD | Admitting: Physical Therapy

## 2017-04-01 DIAGNOSIS — M6281 Muscle weakness (generalized): Secondary | ICD-10-CM | POA: Diagnosis not present

## 2017-04-01 DIAGNOSIS — M5442 Lumbago with sciatica, left side: Secondary | ICD-10-CM

## 2017-04-01 DIAGNOSIS — R293 Abnormal posture: Secondary | ICD-10-CM | POA: Diagnosis not present

## 2017-04-01 NOTE — Therapy (Addendum)
[place], [place], [address] Phone: [phone]   Fax:  [phone]  Physical Therapy Treatment  Patient Details  Name: Brittney Young MRN: [id number] Date of Birth: [birth year] Referring Provider: Dr. Sherley Bounds  Encounter Date: 04/01/2017      PT End of Session - 04/01/17 1437    Visit Number 7   Date for PT Re-Evaluation 04/01/17   PT Start Time 1437   PT Stop Time 1511   PT Time Calculation (min) 34 min   Activity Tolerance Patient tolerated treatment well      Past Medical History:  Diagnosis Date  . Anxiety   . Depression   . Hypertension     Past Surgical History:  Procedure Laterality Date  . CESAREAN SECTION      There were no vitals filed for this visit.      Subjective Assessment - 04/01/17 1439    Currently in Pain? Yes   Pain Score 3    Pain Location Back   Pain Orientation Right;Lower   Pain Descriptors / Indicators Aching   Pain Type Acute pain   Pain Radiating Towards top of Lt ankle   Pain Onset More than a month ago   Pain Frequency Constant   Aggravating Factors  walking, bending over   Pain Relieving Factors not doing anything            [facility] PT Assessment - 04/01/17 0001      Assessment   Medical Diagnosis lumbar radiculopathy     Observation/Other Assessments   Focus on Therapeutic Outcomes (FOTO)  56% limited                     [facility] Adult PT Treatment/Exercise - 04/01/17 0001      Self-Care   Self-Care Other Self-Care Comments   Other Self-Care Comments  discussed POC and progression of lumbar traction.  Trying prone position again and following up with MD.      Modalities   Modalities Traction     Traction   Type of Traction Lumbar  200#  - prone traction    Min (lbs) 35   Max (lbs) 50   Hold Time 60   Rest Time 20   Time 15                     PT Long Term Goals - 04/01/17 1440      PT  LONG TERM GOAL #1   Title I with advanced HEP ( 7/27/168    Status On-going     PT LONG TERM GOAL #2   Title report pain reduction =/> 75% with standing and walking to allow return to work ( 04/03/17)    Status On-going     PT LONG TERM GOAL #3   Title demo strong contraction of TA and multifidi with core work ( 04/03/17)    Time 4   Period Weeks   Status On-going     PT LONG TERM GOAL #4   Title Decrease FOTO to </=  40% limitation (04/03/17)    Time 4   Period Weeks   Status Not Met     PT LONG TERM GOAL #5   Title lift up to 50# floor to waist without increased pain to assist with return to work ( 04/03/17)    Time 4   Period Weeks   Status On-going  Plan - 04/01/17 1500    Clinical Impression Statement Pt reports that she had some improvement after prone lumbar traction and wished to try it again today.  After this she wishes to follow up with her MD next week and see what he suggests as she is running out of funds and co-pays are adding up.     PT Next Visit Plan will place on hold until after her MD appointment next week and see what he recommends.  Unless prone traction helps then she will call to return and an renewal will be written.    Consulted and Agree with Plan of Care Patient      Patient will benefit from skilled therapeutic intervention in order to improve the following deficits and impairments:     Visit Diagnosis: Muscle weakness (generalized)  Acute left-sided low back pain with left-sided sciatica  Abnormal posture     Problem List Patient Active Problem List   Diagnosis Date Noted  . Dehydration 10/03/2016  . Severe episode of recurrent major depressive disorder, without psychotic features ([place]) 10/03/2016  . Grief reaction 10/03/2016  . TMJ (temporomandibular joint syndrome) 05/07/2016  . Cephalalgia 05/07/2016  . Knee pain, left 09/04/2015  . Postlaminectomy syndrome of lumbar region 04/02/2015  . Anxiety and depression  12/18/2014  . Anemia 07/03/2014  . Vitamin D deficiency 07/03/2014  . Other fatigue 07/03/2014  . Essential hypertension, benign 06/30/2014  . Genital herpes 06/30/2014    Jeral Pinch PT  04/01/2017, 3:06 PM  [facility] [place], [place], [address] Phone: [phone]   Fax:  [phone]  Name: Brittney Young MRN: [id number] Date of Birth: [birth year]  PHYSICAL THERAPY DISCHARGE SUMMARY  Visits from Start of Care: 7  Current functional level related to goals / functional outcomes: Unknown   Remaining deficits: unknown   Education / Equipment: HEP Plan: Patient agrees to discharge.  Patient goals were not met. Patient is being discharged due to a change in medical status.  ?????Pt was referred back to MD for further assesment    Jeral Pinch, PT 05/22/17 2:11 PM

## 2017-04-03 ENCOUNTER — Encounter: Payer: BLUE CROSS/BLUE SHIELD | Admitting: Physical Therapy

## 2017-05-05 ENCOUNTER — Encounter: Payer: Self-pay | Admitting: Physician Assistant

## 2017-05-05 DIAGNOSIS — C189 Malignant neoplasm of colon, unspecified: Secondary | ICD-10-CM | POA: Insufficient documentation

## 2017-06-08 ENCOUNTER — Ambulatory Visit (INDEPENDENT_AMBULATORY_CARE_PROVIDER_SITE_OTHER): Payer: BLUE CROSS/BLUE SHIELD | Admitting: Physician Assistant

## 2017-06-08 ENCOUNTER — Encounter: Payer: Self-pay | Admitting: Physician Assistant

## 2017-06-08 VITALS — BP 138/83 | HR 108 | Temp 99.0°F | Ht 64.0 in | Wt 189.0 lb

## 2017-06-08 DIAGNOSIS — R059 Cough, unspecified: Secondary | ICD-10-CM

## 2017-06-08 DIAGNOSIS — J0140 Acute pansinusitis, unspecified: Secondary | ICD-10-CM | POA: Diagnosis not present

## 2017-06-08 DIAGNOSIS — R05 Cough: Secondary | ICD-10-CM

## 2017-06-08 MED ORDER — BENZONATATE 200 MG PO CAPS: 200.0000 mg | ORAL_CAPSULE | Freq: Two times a day (BID) | ORAL | 0 refills | Status: DC | PRN

## 2017-06-08 MED ORDER — AZITHROMYCIN 250 MG PO TABS: ORAL_TABLET | ORAL | 0 refills | Status: DC

## 2017-06-08 NOTE — Progress Notes (Signed)
   Subjective:    Patient ID: Brittney Young, female    DOB: 03-Nov-1970, 45 y.o.   MRN: [id number]  HPI Pt is a 46 yo female undergoing colon cancer chemo treatments who presents to the clinic with sinus pressure and cough. Her son was recently dx of pneumonia and she was coughing before him. She feels like she is running a low grade fever. No chill, SOB, wheezing. Tried OTC ibuprofen and mucinex with little relief.   .. Active Ambulatory Problems    Diagnosis Date Noted  . Essential hypertension, benign 06/30/2014  . Genital herpes 06/30/2014  . Anemia 07/03/2014  . Vitamin D deficiency 07/03/2014  . Other fatigue 07/03/2014  . Anxiety and depression 12/18/2014  . Postlaminectomy syndrome of lumbar region 04/02/2015  . Knee pain, left 09/04/2015  . TMJ (temporomandibular joint syndrome) 05/07/2016  . Cephalalgia 05/07/2016  . Dehydration 10/03/2016  . Severe episode of recurrent major depressive disorder, without psychotic features ([place]) 10/03/2016  . Grief reaction 10/03/2016  . Malignant neoplasm of ascending colon ([place]) 05/05/2017   Resolved Ambulatory Problems    Diagnosis Date Noted  . UTI (urinary tract infection) 03/26/2015  . Lumbosacral strain 03/26/2015  . Weakness of left hip 05/18/2015  . Greater trochanteric bursitis of left hip 06/08/2015  . Left hip pain 09/04/2015   Past Medical History:  Diagnosis Date  . Anxiety   . Depression   . Hypertension       Review of Systems  All other systems reviewed and are negative.      Objective:   Physical Exam  Constitutional: She is oriented to person, place, and time. She appears well-developed and well-nourished.  HENT:  Head: Normocephalic and atraumatic.  Right Ear: External ear normal.  Left Ear: External ear normal.  Mouth/Throat: Oropharynx is clear and moist. No oropharyngeal exudate.  TM erythematous.  Oropharynx erythematous.  Bilateral nasal turbinates red and swollen.  Tenderness over  bilateral maxillary sinuses.   Eyes: Conjunctivae are normal. Right eye exhibits no discharge. Left eye exhibits no discharge.  Neck: Normal range of motion. Neck supple.  Cardiovascular: Regular rhythm and normal heart sounds.   Tachycardia.   Pulmonary/Chest: Effort normal and breath sounds normal. She has no wheezes.  Lymphadenopathy:    She has cervical adenopathy.  Neurological: She is alert and oriented to person, place, and time.  Psychiatric: She has a normal mood and affect. Her behavior is normal.          Assessment & Plan:  Marland KitchenMarland KitchenAnnella was seen today for cough and headache.  Diagnoses and all orders for this visit:  Acute non-recurrent pansinusitis -     azithromycin (ZITHROMAX) 250 MG tablet; Take 2 tablets now and then one tablet for 4 days.  Cough -     benzonatate (TESSALON) 200 MG capsule; Take 1 capsule (200 mg total) by mouth 2 (two) times daily as needed for cough.   Pt is already immunosuppressed will treat with abx. Tessalon for cough. Discussed OTC delsym. HO given for symptomatic care.  Follow up as needed.

## 2017-06-08 NOTE — Patient Instructions (Signed)

## 2017-06-11 ENCOUNTER — Ambulatory Visit (INDEPENDENT_AMBULATORY_CARE_PROVIDER_SITE_OTHER): Payer: BLUE CROSS/BLUE SHIELD | Admitting: Family Medicine

## 2017-06-11 ENCOUNTER — Ambulatory Visit: Payer: BLUE CROSS/BLUE SHIELD | Admitting: Physician Assistant

## 2017-06-11 ENCOUNTER — Encounter: Payer: Self-pay | Admitting: Family Medicine

## 2017-06-11 VITALS — BP 136/84 | HR 93 | Temp 98.2°F | Ht 64.0 in | Wt 192.0 lb

## 2017-06-11 DIAGNOSIS — J320 Chronic maxillary sinusitis: Secondary | ICD-10-CM | POA: Diagnosis not present

## 2017-06-11 MED ORDER — AMOXICILLIN-POT CLAVULANATE 875-125 MG PO TABS: 1.0000 | ORAL_TABLET | Freq: Two times a day (BID) | ORAL | 0 refills | Status: DC

## 2017-06-11 NOTE — Patient Instructions (Addendum)
Can try the nasal saline irrigation twice a day.   Add flonase or nasonex 2 sprays in each nostril once a day Call if not better in 5 days.

## 2017-06-11 NOTE — Progress Notes (Signed)
   Subjective:    Patient ID: Brittney Young, female    DOB: 06/21/1971, 46 y.o.   MRN: [id number]  HPI 46 year old female undergoing chemotherapy comes in today complaining of persistent sinus symptoms. She was seen by her PCP proximally 3 days ago on August 1.  Pt reports that she has 1 does of the Azithromycin left. she stated that the L side of her face is really painful like a toothache she used warm compresses and took tylenol for the pain she didn't want to take/try anything else due to having cancer.  She says initially she started to feel better. She got a lot of relief on the right side of her sinuses but the left has actually become more painful in the last day or so. It's also swollen and just feels tight. She has not been doing any other additional treatments on the area.  Review of Systems     Objective:   Physical Exam  Constitutional: She is oriented to person, place, and time. She appears well-developed and well-nourished.  HENT:  Head: Normocephalic and atraumatic.  Right Ear: External ear normal.  Left Ear: External ear normal.  Nose: Nose normal.  Mouth/Throat: Oropharynx is clear and moist.  TMs and canals are clear.   Eyes: Pupils are equal, round, and reactive to light. Conjunctivae and EOM are normal.  Neck: Neck supple. No thyromegaly present.  Cardiovascular: Normal rate, regular rhythm and normal heart sounds.   Pulmonary/Chest: Effort normal and breath sounds normal. She has no wheezes.  Lymphadenopathy:    She has no cervical adenopathy.  Neurological: She is alert and oriented to person, place, and time.  Skin: Skin is warm and dry.  Psychiatric: She has a normal mood and affect.          Assessment & Plan:  Left maxillary sinusitis-go ahead and start Augmentin. Okay to complete last dose of azithromycin as well. Recommend nasal saline irrigation and a trial of nasal steroid spray. If not improving in the next 2 days or getting worse please give  [place] a call back.

## 2017-07-11 ENCOUNTER — Other Ambulatory Visit: Payer: Self-pay | Admitting: Physician Assistant

## 2017-07-11 DIAGNOSIS — I10 Essential (primary) hypertension: Secondary | ICD-10-CM

## 2017-08-12 ENCOUNTER — Other Ambulatory Visit: Payer: Self-pay | Admitting: Physician Assistant

## 2017-08-12 DIAGNOSIS — I10 Essential (primary) hypertension: Secondary | ICD-10-CM

## 2017-09-07 ENCOUNTER — Other Ambulatory Visit: Payer: Self-pay | Admitting: *Deleted

## 2017-09-07 DIAGNOSIS — I10 Essential (primary) hypertension: Secondary | ICD-10-CM

## 2017-09-07 MED ORDER — HYDROCHLOROTHIAZIDE 25 MG PO TABS: 25.0000 mg | ORAL_TABLET | Freq: Every day | ORAL | 0 refills | Status: DC

## 2017-10-19 ENCOUNTER — Other Ambulatory Visit: Payer: Self-pay | Admitting: Physician Assistant

## 2017-10-19 DIAGNOSIS — I10 Essential (primary) hypertension: Secondary | ICD-10-CM

## 2017-11-16 ENCOUNTER — Other Ambulatory Visit: Payer: Self-pay | Admitting: Physician Assistant

## 2017-11-16 DIAGNOSIS — I10 Essential (primary) hypertension: Secondary | ICD-10-CM

## 2017-11-16 NOTE — Telephone Encounter (Signed)
Must make appointment before further refills given. KG LPN

## 2017-12-08 ENCOUNTER — Other Ambulatory Visit: Payer: Self-pay | Admitting: Physician Assistant

## 2017-12-08 DIAGNOSIS — I10 Essential (primary) hypertension: Secondary | ICD-10-CM

## 2017-12-28 ENCOUNTER — Other Ambulatory Visit: Payer: Self-pay | Admitting: Physician Assistant

## 2017-12-28 DIAGNOSIS — I10 Essential (primary) hypertension: Secondary | ICD-10-CM

## 2018-01-06 ENCOUNTER — Other Ambulatory Visit: Payer: Self-pay | Admitting: Physician Assistant

## 2018-01-06 DIAGNOSIS — I10 Essential (primary) hypertension: Secondary | ICD-10-CM

## 2018-02-26 ENCOUNTER — Ambulatory Visit (INDEPENDENT_AMBULATORY_CARE_PROVIDER_SITE_OTHER): Payer: BLUE CROSS/BLUE SHIELD | Admitting: Physician Assistant

## 2018-02-26 ENCOUNTER — Encounter: Payer: Self-pay | Admitting: Physician Assistant

## 2018-02-26 VITALS — BP 120/81 | HR 91 | Ht 64.0 in | Wt 199.0 lb

## 2018-02-26 DIAGNOSIS — F332 Major depressive disorder, recurrent severe without psychotic features: Secondary | ICD-10-CM | POA: Diagnosis not present

## 2018-02-26 DIAGNOSIS — Z8619 Personal history of other infectious and parasitic diseases: Secondary | ICD-10-CM

## 2018-02-26 DIAGNOSIS — F329 Major depressive disorder, single episode, unspecified: Secondary | ICD-10-CM

## 2018-02-26 DIAGNOSIS — I10 Essential (primary) hypertension: Secondary | ICD-10-CM | POA: Diagnosis not present

## 2018-02-26 DIAGNOSIS — F419 Anxiety disorder, unspecified: Secondary | ICD-10-CM

## 2018-02-26 DIAGNOSIS — C182 Malignant neoplasm of ascending colon: Secondary | ICD-10-CM

## 2018-02-26 DIAGNOSIS — F32A Depression, unspecified: Secondary | ICD-10-CM

## 2018-02-26 MED ORDER — CITALOPRAM HYDROBROMIDE 20 MG PO TABS: 20.0000 mg | ORAL_TABLET | Freq: Every day | ORAL | 3 refills | Status: AC

## 2018-02-26 MED ORDER — POTASSIUM CHLORIDE CRYS ER 20 MEQ PO TBCR: 20.0000 meq | EXTENDED_RELEASE_TABLET | Freq: Every day | ORAL | 5 refills | Status: DC

## 2018-02-26 MED ORDER — ACYCLOVIR 400 MG PO TABS: ORAL_TABLET | ORAL | 5 refills | Status: DC

## 2018-02-26 MED ORDER — HYDROCHLOROTHIAZIDE 25 MG PO TABS: 25.0000 mg | ORAL_TABLET | Freq: Every day | ORAL | 0 refills | Status: DC

## 2018-02-26 MED ORDER — METOPROLOL TARTRATE 25 MG PO TABS: ORAL_TABLET | ORAL | 11 refills | Status: DC

## 2018-02-26 NOTE — Progress Notes (Signed)
Subjective:    Patient ID: Brittney Young, female    DOB: 1970/12/21, 47 y.o.   MRN: [id number]  HPI Pt is a 47 yo female with malignant neoplasm of colon with liver metastasis she presents to the clinic for follow up on medications.   HTN- no CP, palpitations, headaches or vision changes. Taking medication daily.   Her mood is "ok". She has trouble sleeping. She can't stop thinking about the "what ifs". She is often emotional. She is tired a lot. She is currently also having a lot of weakness. Her oncologist recently prescribed zyprexa to add to celexa to see if helps with depression and sleep.   Hx of cold sores. Needs medication for as needed outbreaks.   .. Active Ambulatory Problems    Diagnosis Date Noted  . Essential hypertension, benign 06/30/2014  . Genital herpes 06/30/2014  . Anemia 07/03/2014  . Vitamin D deficiency 07/03/2014  . Other fatigue 07/03/2014  . Anxiety and depression 12/18/2014  . Postlaminectomy syndrome of lumbar region 04/02/2015  . Knee pain, left 09/04/2015  . TMJ (temporomandibular joint syndrome) 05/07/2016  . Cephalalgia 05/07/2016  . Dehydration 10/03/2016  . Severe episode of recurrent major depressive disorder, without psychotic features ([name]) 10/03/2016  . Malignant neoplasm of ascending colon ([place]) 05/05/2017   Resolved Ambulatory Problems    Diagnosis Date Noted  . UTI (urinary tract infection) 03/26/2015  . Lumbosacral strain 03/26/2015  . Weakness of left hip 05/18/2015  . Greater trochanteric bursitis of left hip 06/08/2015  . Left hip pain 09/04/2015  . Grief reaction 10/03/2016   Past Medical History:  Diagnosis Date  . Anxiety   . Depression   . Hypertension       Review of Systems See HPI.     Objective:   Physical Exam  Constitutional: She is oriented to person, place, and time. She appears well-developed and well-nourished.  In a wheelchair.   HENT:  Head: Normocephalic and atraumatic.  Cardiovascular:  Normal rate and regular rhythm.  Pulmonary/Chest: Effort normal and breath sounds normal.  Neurological: She is alert and oriented to person, place, and time.  Psychiatric: She has a normal mood and affect. Her behavior is normal.          Assessment & Plan:  Marland KitchenMarland KitchenKeliah was seen today for hypertension.  Diagnoses and all orders for this visit:  Essential hypertension, benign -     potassium chloride SA (K-DUR,KLOR-CON) 20 MEQ tablet; Take 1 tablet (20 mEq total) by mouth daily with breakfast. -     metoprolol tartrate (LOPRESSOR) 25 MG tablet; TAKE 1 TABLET BY MOUTH 2 TIMES DAILY. -     hydrochlorothiazide (HYDRODIURIL) 25 MG tablet; Take 1 tablet (25 mg total) by mouth daily.  Anxiety and depression -     citalopram (CELEXA) 20 MG tablet; Take 1 tablet (20 mg total) by mouth daily.  Severe episode of recurrent major depressive disorder, without psychotic features ([name]) -     citalopram (CELEXA) 20 MG tablet; Take 1 tablet (20 mg total) by mouth daily.  H/O cold sores -     acyclovir (ZOVIRAX) 400 MG tablet; take 1 tablet by mouth three times a day for OUTBREAK FOR 5 DAYS  Malignant neoplasm of ascending colon ([place])   .[name] Depression screen [facility] 2/9 02/26/2018 06/11/2017 10/03/2016  Decreased Interest 1 0 3  Down, Depressed, Hopeless 0 0 2  PHQ - 2 Score 1 0 5  Altered sleeping 3 - 3  Tired, decreased  energy 1 - 3  Change in appetite 0 - 3  Feeling bad or failure about yourself  1 - 3  Trouble concentrating 0 - 2  Moving slowly or fidgety/restless 0 - 2  Suicidal thoughts 0 - 0  PHQ-9 Score 6 - 21  Difficult doing work/chores Somewhat difficult - -   .[name] GAD 7 : Generalized Anxiety Score 02/26/2018 10/03/2016  Nervous, Anxious, on Edge 1 3  Control/stop worrying 2 3  Worry too much - different things 3 3  Trouble relaxing 1 3  Restless 1 2  Easily annoyed or irritable 1 2  Afraid - awful might happen 3 3  Total GAD 7 Score 12 19  Anxiety Difficulty Somewhat difficult -     BP great today. Refilled medications. Labs are being drawn regularly with oncology. Last lipid was 2 years ago but looked great.   Obviously she is going through a lot mentally. She is not responding as well as oncology would like to see with chemotherapy. Recently zyprexa was ordered. Add to celexa. Certainly can follow up if fills like she needs more help with mood. Suggested counseling to help cope with this new lifechange.   Follow up in 6 months.

## 2018-03-10 ENCOUNTER — Ambulatory Visit (INDEPENDENT_AMBULATORY_CARE_PROVIDER_SITE_OTHER): Payer: BLUE CROSS/BLUE SHIELD | Admitting: Physician Assistant

## 2018-03-10 ENCOUNTER — Encounter: Payer: Self-pay | Admitting: Physician Assistant

## 2018-03-10 ENCOUNTER — Ambulatory Visit (INDEPENDENT_AMBULATORY_CARE_PROVIDER_SITE_OTHER): Payer: BLUE CROSS/BLUE SHIELD

## 2018-03-10 VITALS — BP 123/94 | HR 117 | Temp 98.6°F | Resp 16 | Ht 64.0 in

## 2018-03-10 DIAGNOSIS — R29898 Other symptoms and signs involving the musculoskeletal system: Secondary | ICD-10-CM

## 2018-03-10 DIAGNOSIS — M961 Postlaminectomy syndrome, not elsewhere classified: Secondary | ICD-10-CM | POA: Diagnosis not present

## 2018-03-10 DIAGNOSIS — C182 Malignant neoplasm of ascending colon: Secondary | ICD-10-CM

## 2018-03-10 NOTE — Progress Notes (Signed)
Call pt: no acute findings. Very reassuring.

## 2018-03-10 NOTE — Progress Notes (Signed)
Subjective:    Patient ID: Brittney Young, female    DOB: [birth year], 47 y.o.   MRN: [id number]  HPI  Pt is a 47 yo female with metastatic colon cancer who presents to the clinic with 2-3 months of bilateral leg weakness. She has hx of back pain and surgery but denies any pain today. Her last MRI was 11/2016. She denies any numbness of extremity or radiation of pain. She denies any bowel or bladder discomfort. She has been seen by her oncologist who suspects this is progression of cancer and combination of side effects from chemotherapy.   .. Active Ambulatory Problems    Diagnosis Date Noted  . Essential hypertension, benign 06/30/2014  . Genital herpes 06/30/2014  . Anemia 07/03/2014  . Vitamin D deficiency 07/03/2014  . Other fatigue 07/03/2014  . Anxiety and depression 12/18/2014  . Postlaminectomy syndrome of lumbar region 04/02/2015  . Knee pain, left 09/04/2015  . TMJ (temporomandibular joint syndrome) 05/07/2016  . Cephalalgia 05/07/2016  . Dehydration 10/03/2016  . Severe episode of recurrent major depressive disorder, without psychotic features ([place]) 10/03/2016  . Malignant neoplasm of ascending colon ([place]) 05/05/2017  . Bilateral leg weakness 03/11/2018   Resolved Ambulatory Problems    Diagnosis Date Noted  . UTI (urinary tract infection) 03/26/2015  . Lumbosacral strain 03/26/2015  . Weakness of left hip 05/18/2015  . Greater trochanteric bursitis of left hip 06/08/2015  . Left hip pain 09/04/2015  . Grief reaction 10/03/2016   Past Medical History:  Diagnosis Date  . Anxiety   . Depression   . Hypertension    .[name] Family History  Problem Relation Age of Onset  . Stroke Mother   . Cancer Sister   . Migraines Sister      Review of Systems See HPI.     Objective:   Physical Exam  Constitutional: She is oriented to person, place, and time.  Weak and presents in wheelchair.  HENT:  Head: Normocephalic and atraumatic.  Cardiovascular:   Tachycardia.   Pulmonary/Chest: Effort normal and breath sounds normal.  Musculoskeletal:  No tenderness to palpation over lumbar spine.  Negative straight leg test.  Able to ambulate she just "felt" tired.  NROM of lower extermity.  Strength 3/5 and shaking with exertion.    Neurological: She is alert and oriented to person, place, and time.  Attempted reflexes. Could be her position in wheelchair but not able to elicit.   Psychiatric: Her behavior is normal.  Flat affect.           Assessment & Plan:  Marland KitchenMarland KitchenCaoimhe was seen today for bilateral leg weakness and stage 4 colon cancer.  Diagnoses and all orders for this visit:  Bilateral leg weakness -     DG Lumbar Spine Complete -     Ambulatory referral to Home Health  Malignant neoplasm of ascending colon ([place])  Postlaminectomy syndrome of lumbar region   Likely bilateral leg weakness is progression of disease and side effects to all the chemotherapy. Certainly she does have a hx of back pain and herniated disc. She is not having back pain today. Lumbar xray to evaluate for any acute findings. I will order EMG to detect neuropathy. She restarts chemotherapy next week and I do not know if she would be up for testing but she can postpone if needed. Pt is desperate. I am not sure how good this will do but I did order some home health physical therapy to work on strength and  endurance. I also had a candid conversation that there may not be anything we can do to bring back weakness. Infact as she embarks on more chemo treatments weakness my worsen. Pt reiterates her desire to fight and try everything.   [name].Spent 30 minutes with patient and greater than 50 percent of visit spent counseling patient regarding treatment plan.

## 2018-03-10 NOTE — Patient Instructions (Addendum)
Get xray.  Will order EMG's to look for neuropathy.  Will order home PT.

## 2018-03-11 ENCOUNTER — Encounter: Payer: Self-pay | Admitting: Physician Assistant

## 2018-03-11 DIAGNOSIS — R29898 Other symptoms and signs involving the musculoskeletal system: Secondary | ICD-10-CM | POA: Insufficient documentation

## 2018-03-20 ENCOUNTER — Inpatient Hospital Stay (HOSPITAL_COMMUNITY)
Admission: EM | Admit: 2018-03-20 | Discharge: 2018-03-29 | DRG: 871 | Disposition: A | Discharge status: Hospice | Payer: BLUE CROSS/BLUE SHIELD | Attending: Internal Medicine | Admitting: Internal Medicine

## 2018-03-20 ENCOUNTER — Encounter (HOSPITAL_COMMUNITY): Payer: Self-pay

## 2018-03-20 ENCOUNTER — Inpatient Hospital Stay (HOSPITAL_COMMUNITY): Payer: BLUE CROSS/BLUE SHIELD

## 2018-03-20 ENCOUNTER — Emergency Department (HOSPITAL_COMMUNITY): Payer: BLUE CROSS/BLUE SHIELD

## 2018-03-20 ENCOUNTER — Other Ambulatory Visit: Payer: Self-pay

## 2018-03-20 DIAGNOSIS — C787 Secondary malignant neoplasm of liver and intrahepatic bile duct: Secondary | ICD-10-CM | POA: Diagnosis present

## 2018-03-20 DIAGNOSIS — R188 Other ascites: Secondary | ICD-10-CM | POA: Diagnosis present

## 2018-03-20 DIAGNOSIS — K7460 Unspecified cirrhosis of liver: Secondary | ICD-10-CM | POA: Diagnosis present

## 2018-03-20 DIAGNOSIS — R4182 Altered mental status, unspecified: Secondary | ICD-10-CM

## 2018-03-20 DIAGNOSIS — Z6833 Body mass index (BMI) 33.0-33.9, adult: Secondary | ICD-10-CM

## 2018-03-20 DIAGNOSIS — I639 Cerebral infarction, unspecified: Secondary | ICD-10-CM | POA: Diagnosis present

## 2018-03-20 DIAGNOSIS — R945 Abnormal results of liver function studies: Secondary | ICD-10-CM | POA: Diagnosis not present

## 2018-03-20 DIAGNOSIS — Z515 Encounter for palliative care: Secondary | ICD-10-CM | POA: Diagnosis present

## 2018-03-20 DIAGNOSIS — M25569 Pain in unspecified knee: Secondary | ICD-10-CM

## 2018-03-20 DIAGNOSIS — N6320 Unspecified lump in the left breast, unspecified quadrant: Secondary | ICD-10-CM | POA: Diagnosis present

## 2018-03-20 DIAGNOSIS — D696 Thrombocytopenia, unspecified: Secondary | ICD-10-CM | POA: Diagnosis not present

## 2018-03-20 DIAGNOSIS — E860 Dehydration: Secondary | ICD-10-CM | POA: Diagnosis present

## 2018-03-20 DIAGNOSIS — J9811 Atelectasis: Secondary | ICD-10-CM | POA: Diagnosis present

## 2018-03-20 DIAGNOSIS — G319 Degenerative disease of nervous system, unspecified: Secondary | ICD-10-CM | POA: Diagnosis present

## 2018-03-20 DIAGNOSIS — F329 Major depressive disorder, single episode, unspecified: Secondary | ICD-10-CM | POA: Diagnosis present

## 2018-03-20 DIAGNOSIS — Y95 Nosocomial condition: Secondary | ICD-10-CM | POA: Diagnosis present

## 2018-03-20 DIAGNOSIS — J90 Pleural effusion, not elsewhere classified: Secondary | ICD-10-CM | POA: Diagnosis present

## 2018-03-20 DIAGNOSIS — R6520 Severe sepsis without septic shock: Secondary | ICD-10-CM | POA: Diagnosis present

## 2018-03-20 DIAGNOSIS — R4702 Dysphasia: Secondary | ICD-10-CM | POA: Diagnosis present

## 2018-03-20 DIAGNOSIS — D638 Anemia in other chronic diseases classified elsewhere: Secondary | ICD-10-CM | POA: Diagnosis present

## 2018-03-20 DIAGNOSIS — C189 Malignant neoplasm of colon, unspecified: Secondary | ICD-10-CM | POA: Diagnosis not present

## 2018-03-20 DIAGNOSIS — R10819 Abdominal tenderness, unspecified site: Secondary | ICD-10-CM

## 2018-03-20 DIAGNOSIS — E871 Hypo-osmolality and hyponatremia: Secondary | ICD-10-CM | POA: Diagnosis present

## 2018-03-20 DIAGNOSIS — B373 Candidiasis of vulva and vagina: Secondary | ICD-10-CM | POA: Diagnosis present

## 2018-03-20 DIAGNOSIS — J189 Pneumonia, unspecified organism: Secondary | ICD-10-CM | POA: Diagnosis present

## 2018-03-20 DIAGNOSIS — Z823 Family history of stroke: Secondary | ICD-10-CM

## 2018-03-20 DIAGNOSIS — C7800 Secondary malignant neoplasm of unspecified lung: Secondary | ICD-10-CM | POA: Diagnosis present

## 2018-03-20 DIAGNOSIS — F419 Anxiety disorder, unspecified: Secondary | ICD-10-CM | POA: Diagnosis present

## 2018-03-20 DIAGNOSIS — R569 Unspecified convulsions: Secondary | ICD-10-CM | POA: Diagnosis present

## 2018-03-20 DIAGNOSIS — G92 Toxic encephalopathy: Secondary | ICD-10-CM | POA: Diagnosis present

## 2018-03-20 DIAGNOSIS — K8020 Calculus of gallbladder without cholecystitis without obstruction: Secondary | ICD-10-CM | POA: Diagnosis present

## 2018-03-20 DIAGNOSIS — G4700 Insomnia, unspecified: Secondary | ICD-10-CM | POA: Diagnosis present

## 2018-03-20 DIAGNOSIS — D6959 Other secondary thrombocytopenia: Secondary | ICD-10-CM | POA: Diagnosis present

## 2018-03-20 DIAGNOSIS — A419 Sepsis, unspecified organism: Principal | ICD-10-CM | POA: Diagnosis present

## 2018-03-20 DIAGNOSIS — R000 Tachycardia, unspecified: Secondary | ICD-10-CM | POA: Diagnosis not present

## 2018-03-20 DIAGNOSIS — K7290 Hepatic failure, unspecified without coma: Secondary | ICD-10-CM | POA: Diagnosis present

## 2018-03-20 DIAGNOSIS — I10 Essential (primary) hypertension: Secondary | ICD-10-CM | POA: Diagnosis present

## 2018-03-20 DIAGNOSIS — C799 Secondary malignant neoplasm of unspecified site: Secondary | ICD-10-CM

## 2018-03-20 DIAGNOSIS — D6489 Other specified anemias: Secondary | ICD-10-CM | POA: Diagnosis present

## 2018-03-20 DIAGNOSIS — C19 Malignant neoplasm of rectosigmoid junction: Secondary | ICD-10-CM | POA: Diagnosis present

## 2018-03-20 DIAGNOSIS — E669 Obesity, unspecified: Secondary | ICD-10-CM | POA: Diagnosis present

## 2018-03-20 DIAGNOSIS — T451X5A Adverse effect of antineoplastic and immunosuppressive drugs, initial encounter: Secondary | ICD-10-CM | POA: Diagnosis present

## 2018-03-20 DIAGNOSIS — C786 Secondary malignant neoplasm of retroperitoneum and peritoneum: Secondary | ICD-10-CM | POA: Diagnosis present

## 2018-03-20 DIAGNOSIS — G9341 Metabolic encephalopathy: Secondary | ICD-10-CM

## 2018-03-20 DIAGNOSIS — Z9221 Personal history of antineoplastic chemotherapy: Secondary | ICD-10-CM

## 2018-03-20 DIAGNOSIS — D630 Anemia in neoplastic disease: Secondary | ICD-10-CM | POA: Diagnosis present

## 2018-03-20 HISTORY — DX: Malignant neoplasm of colon, unspecified: C18.9

## 2018-03-20 LAB — COMPREHENSIVE METABOLIC PANEL
ALBUMIN: 3.1 g/dL — AB (ref 3.5–5.0)
ALK PHOS: 588 U/L — AB (ref 38–126)
ALT: 251 U/L — AB (ref 0–44)
AST: 223 U/L — AB (ref 15–41)
Anion gap: 14 (ref 5–15)
BUN: 20 mg/dL (ref 6–20)
CALCIUM: 8.8 mg/dL — AB (ref 8.9–10.3)
CO2: 23 mmol/L (ref 22–32)
CREATININE: 0.73 mg/dL (ref 0.44–1.00)
Chloride: 92 mmol/L — ABNORMAL LOW (ref 98–111)
GFR calc Af Amer: >60 mL/min (ref >60)
GFR calc non Af Amer: >60 mL/min (ref >60)
GLUCOSE: 118 mg/dL — AB (ref 70–99)
Potassium: 4.2 mmol/L (ref 3.5–5.1)
SODIUM: 129 mmol/L — AB (ref 135–145)
Total Bilirubin: 5.6 mg/dL — ABNORMAL HIGH (ref 0.3–1.2)
Total Protein: 7.7 g/dL (ref 6.5–8.1)

## 2018-03-20 LAB — TROPONIN I: Troponin I: 0.04 ng/mL (ref <0.03)

## 2018-03-20 LAB — I-STAT CG4 LACTIC ACID, ED: Lactic Acid, Venous: 3.39 mmol/L (ref 0.5–1.9)

## 2018-03-20 LAB — DIFFERENTIAL
BASOS ABS: 0 10*3/uL (ref 0.0–0.1)
Basophils Relative: 0 %
EOS ABS: 0 10*3/uL (ref 0.0–0.7)
EOS PCT: 0 %
LYMPHS ABS: 0.9 10*3/uL (ref 0.7–4.0)
LYMPHS PCT: 11 %
MONO ABS: 0.2 10*3/uL (ref 0.1–1.0)
MONOS PCT: 2 %
NEUTROS PCT: 87 %
Neutro Abs: 6.7 10*3/uL (ref 1.7–7.7)

## 2018-03-20 LAB — CBC
HCT: 31.7 % — ABNORMAL LOW (ref 36.0–46.0)
Hemoglobin: 10.6 g/dL — ABNORMAL LOW (ref 12.0–15.0)
MCH: 30.5 pg (ref 26.0–34.0)
MCHC: 33.4 g/dL (ref 30.0–36.0)
MCV: 91.1 fL (ref 78.0–100.0)
PLATELETS: 91 10*3/uL — AB (ref 150–400)
RBC: 3.48 MIL/uL — ABNORMAL LOW (ref 3.87–5.11)
RDW: 21.4 % — ABNORMAL HIGH (ref 11.5–15.5)
WBC: 7.7 10*3/uL (ref 4.0–10.5)

## 2018-03-20 LAB — URINALYSIS, ROUTINE W REFLEX MICROSCOPIC
BILIRUBIN URINE: NEGATIVE
GLUCOSE, UA: NEGATIVE mg/dL
Hgb urine dipstick: NEGATIVE
KETONES UR: 5 mg/dL — AB
LEUKOCYTES UA: NEGATIVE
NITRITE: NEGATIVE
PH: 5 (ref 5.0–8.0)
Protein, ur: NEGATIVE mg/dL
Specific Gravity, Urine: 1.018 (ref 1.005–1.030)

## 2018-03-20 LAB — I-STAT BETA HCG BLOOD, ED (MC, WL, AP ONLY)

## 2018-03-20 LAB — D-DIMER, QUANTITATIVE: D-Dimer, Quant: 7.7 ug/mL-FEU — ABNORMAL HIGH (ref 0.00–0.50)

## 2018-03-20 LAB — CBG MONITORING, ED: Glucose-Capillary: 105 mg/dL — ABNORMAL HIGH (ref 70–99)

## 2018-03-20 LAB — AMMONIA: Ammonia: 47 umol/L — ABNORMAL HIGH (ref 9–35)

## 2018-03-20 MED ORDER — CARVEDILOL 6.25 MG PO TABS: 6.2500 mg | ORAL_TABLET | Freq: Two times a day (BID) | ORAL | Status: DC

## 2018-03-20 MED ORDER — VANCOMYCIN HCL 10 G IV SOLR
2000.0000 mg | INTRAVENOUS | Status: AC
  Administered 2018-03-20: 2000 mg via INTRAVENOUS
  Filled 2018-03-20: qty 2000

## 2018-03-20 MED ORDER — METOPROLOL TARTRATE 25 MG PO TABS
25.0000 mg | ORAL_TABLET | Freq: Two times a day (BID) | ORAL | Status: DC
  Administered 2018-03-20 – 2018-03-23 (×6): 25 mg via ORAL
  Filled 2018-03-20 (×7): qty 1

## 2018-03-20 MED ORDER — VANCOMYCIN HCL IN DEXTROSE 1-5 GM/200ML-% IV SOLN
1000.0000 mg | Freq: Once | INTRAVENOUS | Status: DC
  Filled 2018-03-20: qty 200

## 2018-03-20 MED ORDER — SODIUM CHLORIDE 0.9 % IV BOLUS
1000.0000 mL | Freq: Once | INTRAVENOUS | Status: AC
  Administered 2018-03-20: 1000 mL via INTRAVENOUS

## 2018-03-20 MED ORDER — SODIUM CHLORIDE 0.9 % IV SOLN
2.0000 g | Freq: Once | INTRAVENOUS | Status: AC
  Administered 2018-03-20: 2 g via INTRAVENOUS
  Filled 2018-03-20: qty 2

## 2018-03-20 MED ORDER — SODIUM CHLORIDE 0.9 % IV SOLN
500.0000 mg | Freq: Every day | INTRAVENOUS | Status: DC
  Administered 2018-03-20: 500 mg via INTRAVENOUS
  Filled 2018-03-20 (×2): qty 500

## 2018-03-20 MED ORDER — LACTULOSE 10 GM/15ML PO SOLN
30.0000 g | Freq: Once | ORAL | Status: AC
  Administered 2018-03-21: 30 g via ORAL
  Filled 2018-03-20: qty 60

## 2018-03-20 MED ORDER — ENOXAPARIN SODIUM 40 MG/0.4ML ~~LOC~~ SOLN
40.0000 mg | SUBCUTANEOUS | Status: DC
  Administered 2018-03-20 – 2018-03-23 (×4): 40 mg via SUBCUTANEOUS
  Filled 2018-03-20 (×4): qty 0.4

## 2018-03-20 MED ORDER — SODIUM CHLORIDE 0.9 % IV SOLN
INTRAVENOUS | Status: AC
  Administered 2018-03-20: 21:00:00 via INTRAVENOUS

## 2018-03-20 MED ORDER — VANCOMYCIN HCL IN DEXTROSE 750-5 MG/150ML-% IV SOLN
750.0000 mg | Freq: Two times a day (BID) | INTRAVENOUS | Status: DC
  Administered 2018-03-21 – 2018-03-23 (×5): 750 mg via INTRAVENOUS
  Filled 2018-03-20 (×7): qty 150

## 2018-03-20 MED ORDER — SODIUM CHLORIDE 0.9 % IV SOLN
2.0000 g | Freq: Three times a day (TID) | INTRAVENOUS | Status: DC
  Administered 2018-03-21 – 2018-03-23 (×8): 2 g via INTRAVENOUS
  Filled 2018-03-20 (×10): qty 2

## 2018-03-20 MED ORDER — POTASSIUM CHLORIDE CRYS ER 20 MEQ PO TBCR
20.0000 meq | EXTENDED_RELEASE_TABLET | Freq: Every day | ORAL | Status: DC
  Administered 2018-03-21 – 2018-03-23 (×3): 20 meq via ORAL
  Filled 2018-03-20 (×5): qty 1

## 2018-03-20 MED ORDER — HYDROCHLOROTHIAZIDE 25 MG PO TABS
25.0000 mg | ORAL_TABLET | Freq: Every day | ORAL | Status: DC
  Administered 2018-03-21 – 2018-03-23 (×2): 25 mg via ORAL
  Filled 2018-03-20 (×3): qty 1

## 2018-03-20 MED ORDER — FERROUS FUMARATE 324 (106 FE) MG PO TABS
1.0000 | ORAL_TABLET | Freq: Two times a day (BID) | ORAL | Status: DC
  Administered 2018-03-21 – 2018-03-24 (×5): 106 mg via ORAL
  Filled 2018-03-20 (×10): qty 1

## 2018-03-20 MED ORDER — CITALOPRAM HYDROBROMIDE 20 MG PO TABS
20.0000 mg | ORAL_TABLET | Freq: Every day | ORAL | Status: DC
  Administered 2018-03-21 – 2018-03-29 (×9): 20 mg via ORAL
  Filled 2018-03-20 (×9): qty 1

## 2018-03-20 NOTE — H&P (Signed)
TRH H&P   Patient Demographics:    Brittney Young, is a 47 y.o. female  MRN: [id number]   DOB - May 02, 1971  Admit Date - 03/20/2018  Outpatient Primary MD for the patient is Lavada Mesi  Referring MD/NP/PA:  Sherwood Gambler  Outpatient Specialists:  [facility] Dionne Milo  Patient coming from: home  Chief Complaint  Patient presents with  . Altered Mental Status      HPI:    Brittney Young  is a 47 y.o. female, w stage 4B (CRC, [place]ras+ metastatic adenocarcinoma of colorectal origin (cecal), apparently tx with  05/12/2017 Folfox =>disease progression 08/11/2017 Folfiri=> disease progression  08/11/2017,  Considered for CAR-T therapy at [facility] but not a candidate due to liver enzyme elevation,  Started Lonsurf 01/18/2018 => dyspnea (hospitalization) and then, 5FU /LV + Avastin started 03/15/2018.  Pt presents due to Altered Mental status.  Pt denies fever, chills, cough, cp, palp, sob, n/v, diarrhea, brbpr.   In ED,  CT brain  IMPRESSION: Mild cerebral atrophy.  No acute intracranial abnormality.  CXR IMPRESSION: 1. Right lung base opacity consistent with pneumonia.  RUQ ultrasound  IMPRESSION: 1. Cholelithiasis.  No secondary findings of acute cholecystitis. 2. Nodular liver contour and heterogeneous echotexture compatible with cirrhosis. 3. Small volume of perihepatic ascites.   Na 129, K 4.2, Bun 20, Creatinine 0.73 Ast 223, Alt 251 Alk phos 588, T. Bili 5.6 Wbc 7.7, Hgb 10.6, Plt 91  Urinalysis negative  Pt will be admitted for AMS, and pneumonia. Hcap     Review of systems:    In addition to the HPI above, No Fever-chills, No Headache, No changes with Vision or hearing, No problems swallowing food or Liquids, No Chest pain, Cough or Shortness of Breath, No Abdominal pain, No Nausea or Vommitting, Bowel movements are  regular, No Blood in stool or Urine, No dysuria, No new skin rashes or bruises, No new joints pains-aches,  No new weakness, tingling, numbness in any extremity, No recent weight gain or loss, No polyuria, polydypsia or polyphagia, No significant Mental Stressors.  A full 10 point Review of Systems was done, except as stated above, all other Review of Systems were negative.   With Past History of the following :    Past Medical History:  Diagnosis Date  . Anxiety   . Colon cancer ([place])   . Depression   . Hypertension       Past Surgical History:  Procedure Laterality Date  . CESAREAN SECTION        Social History:     Social History   Tobacco Use  . Smoking status: Never Smoker  . Smokeless tobacco: Never Used  Substance Use Topics  . Alcohol use: No     Lives - at home  Mobility - walks by self   Family History :     Family History  Problem Relation Age of Onset  . Stroke Mother   . Cancer Sister   . Migraines Sister        Home Medications:   Prior to Admission medications   Medication Sig Start Date End Date Taking? Authorizing Provider  acyclovir (ZOVIRAX) 400 MG tablet take 1 tablet by mouth three times a day for OUTBREAK FOR 5 DAYS 02/26/18   Iran Planas L, PA-C  citalopram (CELEXA) 20 MG tablet Take 1 tablet (20 mg total) by mouth daily. 02/26/18   Breeback, Royetta Car, PA-C  Ferrous Fumarate (HEMOCYTE - 106 MG FE) 324 (106 Fe) MG TABS tablet Take 1 tablet (106 mg of iron total) by mouth 2 (two) times daily before a meal. 10/03/16   Trixie Dredge, PA-C  hydrochlorothiazide (HYDRODIURIL) 25 MG tablet Take 1 tablet (25 mg total) by mouth daily. 02/26/18   Breeback, Jade L, PA-C  metoprolol tartrate (LOPRESSOR) 25 MG tablet TAKE 1 TABLET BY MOUTH 2 TIMES DAILY. 02/26/18   Breeback, Luvenia Starch L, PA-C  potassium chloride SA (K-DUR,KLOR-CON) 20 MEQ tablet Take 1 tablet (20 mEq total) by mouth daily with breakfast. 02/26/18   Breeback, Jade L, PA-C   pregabalin (LYRICA) 25 MG capsule Take 1 capsule (25 mg total) by mouth 2 (two) times daily. 01/16/17   Silverio Decamp, MD     Allergies:    No Known Allergies   Physical Exam:   Vitals  Blood pressure (!) 128/116, pulse (!) 103, temperature 97.7 F (36.5 C), resp. rate (!) 22, weight 88.9 kg (196 lb), SpO2 98 %.   1. General  lying in bed in NAD,    2. Normal affect and insight, Not Suicidal or Homicidal, Awake Alert, Oriented X 3.  3. No F.N deficits, ALL C.Nerves Intact, Strength 5/5 all 4 extremities, Sensation intact all 4 extremities, Plantars down going.  4. Ears and Eyes appear Normal, Conjunctivae clear, PERRLA. Moist Oral Mucosa.  5. Supple Neck, No JVD, No cervical lymphadenopathy appriciated, No Carotid Bruits.  6. Symmetrical Chest wall movement, Good air movement bilaterally, CTAB.  7. RRR, No Gallops, Rubs or Murmurs, No Parasternal Heave.  8. Positive Bowel Sounds, Abdomen Soft, No tenderness, No organomegaly appriciated,No rebound -guarding or rigidity.  9.  No Cyanosis, Normal Skin Turgor, No Skin Rash or Bruise.  10. Good muscle tone,  joints appear normal , no effusions, Normal ROM.  11. No Palpable Lymph Nodes in Neck or Axillae     Data Review:    CBC Recent Labs  Lab 03/20/18 1710  WBC 7.7  HGB 10.6*  HCT 31.7*  PLT 91*  MCV 91.1  MCH 30.5  MCHC 33.4  RDW 21.4*  LYMPHSABS 0.9  MONOABS 0.2  EOSABS 0.0  BASOSABS 0.0   ------------------------------------------------------------------------------------------------------------------  Chemistries  Recent Labs  Lab 03/20/18 1710  NA 129*  K 4.2  CL 92*  CO2 23  GLUCOSE 118*  BUN 20  CREATININE 0.73  CALCIUM 8.8*  AST 223*  ALT 251*  ALKPHOS 588*  BILITOT 5.6*   ------------------------------------------------------------------------------------------------------------------ estimated creatinine clearance is 95.9 mL/min (by C-G formula based on SCr of 0.73  mg/dL). ------------------------------------------------------------------------------------------------------------------ No results for input(s): TSH, T4TOTAL, T3FREE, THYROIDAB in the last 72 hours.  Invalid input(s): FREET3  Coagulation profile No results for input(s): INR, PROTIME in the last 168 hours. ------------------------------------------------------------------------------------------------------------------- Recent Labs    03/20/18 2202  DDIMER 7.70*   -------------------------------------------------------------------------------------------------------------------  Cardiac Enzymes No results for input(s): CKMB, TROPONINI, MYOGLOBIN in the last 168 hours.  Invalid input(s): CK ------------------------------------------------------------------------------------------------------------------ No results found for: BNP   ---------------------------------------------------------------------------------------------------------------  Urinalysis    Component Value Date/Time   COLORURINE AMBER (A) 03/20/2018 1714   APPEARANCEUR CLEAR 03/20/2018 1714   LABSPEC 1.018 03/20/2018 1714   PHURINE 5.0 03/20/2018 1714   GLUCOSEU NEGATIVE 03/20/2018 1714   HGBUR NEGATIVE 03/20/2018 1714   BILIRUBINUR NEGATIVE 03/20/2018 1714   BILIRUBINUR neg 03/26/2015 1539   KETONESUR 5 (A) 03/20/2018 1714   PROTEINUR NEGATIVE 03/20/2018 1714   UROBILINOGEN 0.2 03/26/2015 1539   NITRITE NEGATIVE 03/20/2018 1714   LEUKOCYTESUR NEGATIVE 03/20/2018 1714    ----------------------------------------------------------------------------------------------------------------   Imaging Results:    Dg Ankle Complete Left  Result Date: 03/20/2018 CLINICAL DATA:  Ankle pain. Stage IV colon cancer. No trauma history submitted. EXAM: LEFT ANKLE COMPLETE - 3+ VIEW COMPARISON:  None. FINDINGS: Diffuse soft tissue swelling could be due to patient body habitus. No acute fracture or dislocation. Base of  fifth metatarsal and talar dome intact. IMPRESSION: No acute osseous abnormality. Electronically Signed   By: Abigail Miyamoto M.D.   On: 03/20/2018 19:06   Dg Ankle Complete Right  Result Date: 03/20/2018 CLINICAL DATA:  Patient arrived via [facility] from home. Patient has had altered mental status and generalized weakness for the past day. Stage 4 colon cancer pt. Patient states to the EMS, "I am a ghost, I am a alien, I cant walk , I am white". PT c/o bilateral ankle pain. EXAM: RIGHT ANKLE - COMPLETE 3+ VIEW COMPARISON:  None. FINDINGS: Diffuse soft tissue swelling could be habitus induced. No acute fracture or dislocation. Base of fifth metatarsal and talar dome intact. No focal osseous lesion. IMPRESSION: No acute osseous abnormality. Electronically Signed   By: Abigail Miyamoto M.D.   On: 03/20/2018 19:07   Ct Head Wo Contrast  Result Date: 03/20/2018 CLINICAL DATA:  Altered consciousness.  Colon cancer. EXAM: CT HEAD WITHOUT CONTRAST TECHNIQUE: Contiguous axial images were obtained from the base of the skull through the vertex without intravenous contrast. COMPARISON:  10/12/2004 FINDINGS: Brain: Mild cerebral atrophy for age. Dural calcifications again identified. No mass lesion, hemorrhage, hydrocephalus, acute infarct, intra-axial, or extra-axial fluid collection. Vascular: No hyperdense vessel or unexpected calcification. Skull: Normal Sinuses/Orbits: Normal imaged portions of the orbits and globes. Clear paranasal sinuses and mastoid air cells. Other: None. IMPRESSION: Mild cerebral atrophy.  No acute intracranial abnormality. Electronically Signed   By: Abigail Miyamoto M.D.   On: 03/20/2018 17:40   US Abdomen Limited  Result Date: 03/20/2018 CLINICAL DATA:  47 y/o  F; abnormal liver function. EXAM: ULTRASOUND ABDOMEN LIMITED RIGHT UPPER QUADRANT COMPARISON:  None. FINDINGS: Gallbladder: Cholelithiasis. No gallbladder wall thickening. No pericholecystic fluid. Negative sonographic Murphy's sign. Common  bile duct: Diameter: 2.9 mm Liver: Diffuse heterogeneity of liver parenchyma and nodular contour. Small volume of perihepatic ascites. Portal vein is patent on color Doppler imaging with normal direction of blood flow towards the liver. IMPRESSION: 1. Cholelithiasis.  No secondary findings of acute cholecystitis. 2. Nodular liver contour and heterogeneous echotexture compatible with cirrhosis. 3. Small volume of perihepatic ascites. Electronically Signed   By: Kristine Garbe M.D.   On: 03/20/2018 21:00   Dg Chest Portable 1 View  Result Date: 03/20/2018 CLINICAL DATA:  Cough. Altered mental status. History of stage IV colon carcinoma. EXAM: PORTABLE CHEST 1 VIEW COMPARISON:  07/28/2012 FINDINGS: Right lung base opacity partly silhouettes the right hemidiaphragm. Remainder of the lungs is clear. Cardiac silhouette is normal in size. No mediastinal or hilar  masses. Right anterior chest wall power Port-A-Cath has its tip in the mid superior vena cava. Skeletal structures are intact. IMPRESSION: 1. Right lung base opacity consistent with pneumonia. Electronically Signed   By: Lajean Manes M.D.   On: 03/20/2018 17:41       Assessment & Plan:    Principal Problem:   Pneumonia Active Problems:   Sepsis ([place])   Tachycardia    AMS Secondary to hepatic encephalopathy  MRI brain in am r.o metastatic disease Lactulose 30gm po x1 Check ammonia in am  Pneumonia (hcap) Sputum gram stain, culture Blood culture x2 Urine legionella antigen Urine strep antigen vanco iv , cefepime iv pharmacy to dose zithromax 51m iv qday  Sepsis (tachycardia, lactic acidosis, abnormal liver function) Blood culture x2 pending Vanco, cefepime, zithromax as above  Abnormal liver function RUQ ultrasound negative for cholecystitis  Tachycardia D dimer, if positive then CTA chest r/o PE Check TSH Check trop I q6h x3  Hyponatremia Hydrate with ns iv Check cmp in am  Hypertension Cont metoprolol  263mpo bid Cont hydrochlorothiazide 2569mo qday  Metastatic colon cancer  Please f/u with oncology as outpatient   DVT Prophylaxis Lovenox - SCDs  AM Labs Ordered, also please review Full Orders  Family Communication: Admission, patients condition and plan of care including tests being ordered have been discussed with the patient  who indicate understanding and agree with the plan and Code Status.  Code Status FULL CODE  Likely DC to  home  Condition GUARDED    Consults called: none  Admission status: inpatient   Time spent in minutes : 70   JamJani GravelD on 03/20/2018 at 10:57 PM  Between 7am to 7pm - Pager - [phone] 7pm go to [email or web address] - password [email or web address] - Office  [phone]

## 2018-03-20 NOTE — ED Notes (Signed)
I gave critical I Stat CG4 result to MD Goldston 

## 2018-03-20 NOTE — ED Triage Notes (Signed)
Patient arrived via [facility] from home. Patient has had altered mental status and generalized weakness for the past day. Stage 4 colon cancer pt. Patient is seen at [facility] medical center. Patient is a/ox0. Patient states to the EMS, "I am a ghost, I am a alien, I cant walk , I am white". Patients eyes are jaundice, lower extremities edema, abdominal distension.

## 2018-03-20 NOTE — Progress Notes (Signed)
Pharmacy Antibiotic Note  Brittney Young is a 47 y.o. female admitted on 03/20/2018 with sepsis.  Pharmacy has been consulted for cefepime and vancomycin dosing.  Plan: Cefepime 2 Gm IV q8h Vancomycin 2 Gm x1 then 750 mg IV q12h for est AUC = 461 Goal AUC = 400-500 Zmax 500 mg IV q24h (MD) F/u cultures/levels   Weight: 196 lb (88.9 kg)  Temp (24hrs), Avg:97.6 F (36.4 C), Min:97.5 F (36.4 C), Max:97.7 F (36.5 C)  Recent Labs  Lab 03/20/18 1710 03/20/18 1816  WBC 7.7  --   CREATININE 0.73  --   LATICACIDVEN  --  3.39*    Estimated Creatinine Clearance: 95.9 mL/min (by C-G formula based on SCr of 0.73 mg/dL).    No Known Allergies  Antimicrobials this admission: 7/13 cefepime >>  7/13 vancomycin >>  7/13 zmax >>  Dose adjustments this admission:   Microbiology results:  BCx:   UCx:    Sputum:    MRSA PCR:  Thank you for allowing pharmacy to be a part of this patient's care.  Dorrene German 03/20/2018 9:30 PM

## 2018-03-20 NOTE — ED Notes (Signed)
Both cultures retrieved before antibiotics given.

## 2018-03-20 NOTE — ED Notes (Signed)
Bed: UU82 Expected date:  Expected time:  Means of arrival:  Comments: AMS

## 2018-03-20 NOTE — ED Notes (Signed)
ED TO INPATIENT HANDOFF REPORT  Name/Age/Gender Brittney Young 47 y.o. female  Code Status    Code Status Orders  (From admission, onward)        Start     Ordered   03/20/18 1909  Full code  Continuous     03/20/18 1915    Code Status History    This patient has a current code status but no historical code status.      Home/SNF/Other Home  Chief Complaint altered mental status  Level of Care/Admitting Diagnosis ED Disposition    ED Disposition Condition Comment   Admit  Hospital Area: [facility] [[id number]]  Level of Care: Telemetry [5]  Admit to tele based on following criteria: Monitor for Ischemic changes  Diagnosis: Pneumonia [227785]  Admitting Physician: Jani Gravel [[id number]]  Attending Physician: Jani Gravel [id number]  Estimated length of stay: past midnight tomorrow  Certification:: I certify this patient will need inpatient services for at least 2 midnights  PT Class (Do Not Modify): Inpatient [101]  PT Acc Code (Do Not Modify): Private [1]       Medical History Past Medical History:  Diagnosis Date  . Anxiety   . Depression   . Hypertension     Allergies No Known Allergies  IV Location/Drains/Wounds Patient Lines/Drains/Airways Status   Active Line/Drains/Airways    Name:   Placement date:   Placement time:   Site:   Days:   Peripheral IV 03/20/18 Left Antecubital   03/20/18    1648    Antecubital   less than 1   Peripheral IV 03/20/18 Right Antecubital   03/20/18    1824    Antecubital   less than 1          Labs/Imaging Results for orders placed or performed during the hospital encounter of 03/20/18 (from the past 48 hour(s))  Comprehensive metabolic panel     Status: Abnormal   Collection Time: 03/20/18  5:10 PM  Result Value Ref Range   Sodium 129 (L) 135 - 145 mmol/L   Potassium 4.2 3.5 - 5.1 mmol/L   Chloride 92 (L) 98 - 111 mmol/L    Comment: Please note change in reference range.   CO2 23 22 - 32 mmol/L   Glucose, Bld 118 (H) 70 - 99 mg/dL    Comment: Please note change in reference range.   BUN 20 6 - 20 mg/dL    Comment: Please note change in reference range.   Creatinine, Ser 0.73 0.44 - 1.00 mg/dL   Calcium 8.8 (L) 8.9 - 10.3 mg/dL   Total Protein 7.7 6.5 - 8.1 g/dL   Albumin 3.1 (L) 3.5 - 5.0 g/dL   AST 223 (H) 15 - 41 U/L   ALT 251 (H) 0 - 44 U/L    Comment: Please note change in reference range.   Alkaline Phosphatase 588 (H) 38 - 126 U/L   Total Bilirubin 5.6 (H) 0.3 - 1.2 mg/dL   GFR calc non Af Amer >60 >60 mL/min   GFR calc Af Amer >60 >60 mL/min    Comment: (NOTE) The eGFR has been calculated using the CKD EPI equation. This calculation has not been validated in all clinical situations. eGFR's persistently <60 mL/min signify possible Chronic Kidney Disease.    Anion gap 14 5 - 15    Comment: Performed at [facility], [place] [address], [place], [place] [address]  CBC     Status: Abnormal   Collection  Time: 03/20/18  5:10 PM  Result Value Ref Range   WBC 7.7 4.0 - 10.5 K/uL   RBC 3.48 (L) 3.87 - 5.11 MIL/uL   Hemoglobin 10.6 (L) 12.0 - 15.0 g/dL   HCT 31.7 (L) 36.0 - 46.0 %   MCV 91.1 78.0 - 100.0 fL   MCH 30.5 26.0 - 34.0 pg   MCHC 33.4 30.0 - 36.0 g/dL   RDW 21.4 (H) 11.5 - 15.5 %   Platelets 91 (L) 150 - 400 K/uL    Comment: SPECIMEN CHECKED FOR CLOTS REPEATED TO VERIFY PLATELET COUNT CONFIRMED BY SMEAR Performed at [facility] [address] [place], [place] [address]   Differential     Status: None   Collection Time: 03/20/18  5:10 PM  Result Value Ref Range   Neutrophils Relative % 87 %   Neutro Abs 6.7 1.7 - 7.7 K/uL   Lymphocytes Relative 11 %   Lymphs Abs 0.9 0.7 - 4.0 K/uL   Monocytes Relative 2 %   Monocytes Absolute 0.2 0.1 - 1.0 K/uL   Eosinophils Relative 0 %   Eosinophils Absolute 0.0 0.0 - 0.7 K/uL   Basophils Relative 0 %   Basophils Absolute 0.0 0.0 - 0.1 K/uL    Comment: Performed at [facility], [place] [address] [place], [place] [address]  I-Stat beta hCG blood, ED     Status: None   Collection Time: 03/20/18  5:14 PM  Result Value Ref Range   I-stat hCG, quantitative <5.0 <5 mIU/mL   Comment 3            Comment:   GEST. AGE      CONC.  (mIU/mL)   <=1 WEEK        5 - 50     2 WEEKS       50 - 500     3 WEEKS       100 - 10,000     4 WEEKS     1,000 - 30,000        FEMALE AND NON-PREGNANT FEMALE:     LESS THAN 5 mIU/mL   Urinalysis, Routine w reflex microscopic     Status: Abnormal   Collection Time: 03/20/18  5:14 PM  Result Value Ref Range   Color, Urine AMBER (A) YELLOW    Comment: BIOCHEMICALS MAY BE AFFECTED BY COLOR   APPearance CLEAR CLEAR   Specific Gravity, Urine 1.018 1.005 - 1.030   pH 5.0 5.0 - 8.0   Glucose, UA NEGATIVE NEGATIVE mg/dL   Hgb urine dipstick NEGATIVE NEGATIVE   Bilirubin Urine NEGATIVE NEGATIVE   Ketones, ur 5 (A) NEGATIVE mg/dL   Protein, ur NEGATIVE NEGATIVE mg/dL   Nitrite NEGATIVE NEGATIVE   Leukocytes, UA NEGATIVE NEGATIVE    Comment: Performed at [facility], [place] [address], [place],  [address]  CBG monitoring, ED     Status: Abnormal   Collection Time: 03/20/18  5:38 PM  Result Value Ref Range   Glucose-Capillary 105 (H) 70 - 99 mg/dL  I-Stat CG4 Lactic Acid, ED     Status: Abnormal   Collection Time: 03/20/18  6:16 PM  Result Value Ref Range   Lactic Acid, Venous 3.39 (HH) 0.5 - 1.9 mmol/L   Comment NOTIFIED PHYSICIAN    Dg Ankle Complete Left  Result Date: 03/20/2018 CLINICAL DATA:  Ankle pain. Stage IV colon cancer. No trauma history submitted. EXAM: LEFT ANKLE COMPLETE - 3+ VIEW  COMPARISON:  None. FINDINGS: Diffuse soft tissue swelling could be due to patient body habitus. No acute fracture or dislocation. Base of fifth metatarsal and talar dome intact. IMPRESSION: No acute osseous abnormality. Electronically Signed   By: Abigail Miyamoto M.D.   On: 03/20/2018 19:06   Dg Ankle Complete  Right  Result Date: 03/20/2018 CLINICAL DATA:  Patient arrived via [facility] from home. Patient has had altered mental status and generalized weakness for the past day. Stage 4 colon cancer pt. Patient states to the EMS, "I am a ghost, I am a alien, I cant walk , I am white". PT c/o bilateral ankle pain. EXAM: RIGHT ANKLE - COMPLETE 3+ VIEW COMPARISON:  None. FINDINGS: Diffuse soft tissue swelling could be habitus induced. No acute fracture or dislocation. Base of fifth metatarsal and talar dome intact. No focal osseous lesion. IMPRESSION: No acute osseous abnormality. Electronically Signed   By: Abigail Miyamoto M.D.   On: 03/20/2018 19:07   Ct Head Wo Contrast  Result Date: 03/20/2018 CLINICAL DATA:  Altered consciousness.  Colon cancer. EXAM: CT HEAD WITHOUT CONTRAST TECHNIQUE: Contiguous axial images were obtained from the base of the skull through the vertex without intravenous contrast. COMPARISON:  10/12/2004 FINDINGS: Brain: Mild cerebral atrophy for age. Dural calcifications again identified. No mass lesion, hemorrhage, hydrocephalus, acute infarct, intra-axial, or extra-axial fluid collection. Vascular: No hyperdense vessel or unexpected calcification. Skull: Normal Sinuses/Orbits: Normal imaged portions of the orbits and globes. Clear paranasal sinuses and mastoid air cells. Other: None. IMPRESSION: Mild cerebral atrophy.  No acute intracranial abnormality. Electronically Signed   By: Abigail Miyamoto M.D.   On: 03/20/2018 17:40   Dg Chest Portable 1 View  Result Date: 03/20/2018 CLINICAL DATA:  Cough. Altered mental status. History of stage IV colon carcinoma. EXAM: PORTABLE CHEST 1 VIEW COMPARISON:  07/28/2012 FINDINGS: Right lung base opacity partly silhouettes the right hemidiaphragm. Remainder of the lungs is clear. Cardiac silhouette is normal in size. No mediastinal or hilar masses. Right anterior chest wall power Port-A-Cath has its tip in the mid superior vena cava. Skeletal structures are intact.  IMPRESSION: 1. Right lung base opacity consistent with pneumonia. Electronically Signed   By: Lajean Manes M.D.   On: 03/20/2018 17:41    Pending Labs Unresulted Labs (From admission, onward)   Start     Ordered   03/27/18 0500  Creatinine, serum  (enoxaparin (LOVENOX)    CrCl >/= 30 ml/min)  Weekly,   R    Comments:  while on enoxaparin therapy    03/20/18 1915   03/20/18 1915  Troponin I (q 6hr x 3)  Now then every 6 hours,   R     03/20/18 1915   03/20/18 1914  D-dimer, quantitative (not at [facility])  Add-on,   R     03/20/18 1915   03/20/18 1910  Culture, sputum-assessment  Once,   R    Question:  Patient immune status  Answer:  Normal   03/20/18 1915   03/20/18 1910  Legionella Pneumophila Serogp 1 Ur Ag  Once,   R     03/20/18 1915   03/20/18 1910  Respiratory Panel by PCR  (Respiratory virus panel)  Once,   R    Question:  Patient immune status  Answer:  Normal   03/20/18 1915   03/20/18 1906  Gram stain  Once,   R    Question:  Patient immune status  Answer:  Normal   03/20/18 1915   03/20/18  1906  HIV antibody (Routine Screening)  Once,   R     03/20/18 1915   03/20/18 1906  Strep pneumoniae urinary antigen  Once,   R     03/20/18 1915   03/20/18 1837  Ammonia  STAT,   STAT     03/20/18 1836   03/20/18 1749  Culture, blood (routine x 2)  BLOOD CULTURE X 2,   STAT     03/20/18 1750      Vitals/Pain Today's Vitals   03/20/18 1737 03/20/18 1745 03/20/18 1803 03/20/18 1901  BP: (!) 132/100 (!) 133/99  (!) 154/109  Pulse: (!) 105 (!) 105  (!) 103  Resp: 20 (!) 24  (!) 29  Temp:      TempSrc:      SpO2: 100% 99%  95%  Weight:   196 lb (88.9 kg)     Isolation Precautions Droplet precaution  Medications Medications  vancomycin (VANCOCIN) 2,000 mg in sodium chloride 0.9 % 500 mL IVPB (2,000 mg Intravenous New Bag/Given 03/20/18 1827)  enoxaparin (LOVENOX) injection 40 mg (has no administration in time range)  0.9 %  sodium chloride infusion (has no administration in  time range)  ceFEPIme (MAXIPIME) 2 g in sodium chloride 0.9 % 100 mL IVPB (2 g Intravenous New Bag/Given 03/20/18 1827)  sodium chloride 0.9 % bolus 1,000 mL (1,000 mLs Intravenous New Bag/Given 03/20/18 1831)    Mobility non-ambulatory (due to weakness)

## 2018-03-20 NOTE — Progress Notes (Signed)
A consult was received from an ED physician for Vancomycin and Cefepime per pharmacy dosing.  The patient's profile has been reviewed for ht/wt/allergies/indication/available labs.    A one time order has been placed for Vancomycin 2g IV and Cefepime 2g IV.  Further antibiotics/pharmacy consults should be ordered by admitting physician if indicated.                       Thank you, Luiz Ochoa 03/20/2018  6:05 PM

## 2018-03-20 NOTE — ED Provider Notes (Signed)
[facility] DEPT Provider Note   CSN: [id number] Arrival date & time: 03/20/18  1638     History   Chief Complaint Chief Complaint  Patient presents with  . Altered Mental Status    HPI Brittney Young is a 47 y.o. female.  HPI  47 year old female with a history of metastatic colorectal cancer presents with altered mental status.  The patient is altered and thus the history is obtained from family.  This includes sister who is the primary Media planner.  The patient has been confused for the last 2 or 3 days.  This is been progressive.  She went to the hospital 2 days ago where she had labs and a CT scan but states that they did not find any obvious cause.  Saw her oncologist yesterday.  Was told that the liver was doing worse.  She has not had any fevers.  She reported to family that it was burning when she urinated when she was asked.  However currently when I am talking to the patient she is only responding with "I am an alien" or "I am a ghost".  She refuses to do certain exam components.  Has had a mild cough recently. When asked if she has a prognosis, family tells me that she was told 5 or 6 months probably by the oncologist yesterday.  She was advised to get hospice to help with her activities of daily living as the sister is primarily caring for but cannot stay home throughout the day.  The patient was found sitting on the ground [place] style but has otherwise not been walking today. Typically lives alone.  In the note from her oncologist from yesterday, Dr. Georgiann Cocker wrote: "I have told the family that I do not think we will be giving any more chemotherapy and that everyone should come and visit her as often as they can as I do not anticipate her surviving another week."  Past Medical History:  Diagnosis Date  . Anxiety   . Colon cancer ([place])   . Depression   . Hypertension     Patient Active Problem List   Diagnosis Date Noted  . Pneumonia  03/20/2018  . Sepsis ([place]) 03/20/2018  . Tachycardia 03/20/2018  . Bilateral leg weakness 03/11/2018  . Malignant neoplasm of ascending colon ([place]) 05/05/2017  . Dehydration 10/03/2016  . Severe episode of recurrent major depressive disorder, without psychotic features ([place]) 10/03/2016  . TMJ (temporomandibular joint syndrome) 05/07/2016  . Cephalalgia 05/07/2016  . Knee pain, left 09/04/2015  . Postlaminectomy syndrome of lumbar region 04/02/2015  . Anxiety and depression 12/18/2014  . Anemia 07/03/2014  . Vitamin D deficiency 07/03/2014  . Other fatigue 07/03/2014  . Essential hypertension, benign 06/30/2014  . Genital herpes 06/30/2014    Past Surgical History:  Procedure Laterality Date  . CESAREAN SECTION       OB History   None      Home Medications    Prior to Admission medications   Medication Sig Start Date End Date Taking? Authorizing Provider  acyclovir (ZOVIRAX) 400 MG tablet take 1 tablet by mouth three times a day for OUTBREAK FOR 5 DAYS 02/26/18   Iran Planas L, PA-C  citalopram (CELEXA) 20 MG tablet Take 1 tablet (20 mg total) by mouth daily. 02/26/18   Breeback, Royetta Car, PA-C  Ferrous Fumarate (HEMOCYTE - 106 MG FE) 324 (106 Fe) MG TABS tablet Take 1 tablet (106 mg of iron total) by mouth 2 (  two) times daily before a meal. 10/03/16   Trixie Dredge, PA-C  hydrochlorothiazide (HYDRODIURIL) 25 MG tablet Take 1 tablet (25 mg total) by mouth daily. 02/26/18   Breeback, Jade L, PA-C  metoprolol tartrate (LOPRESSOR) 25 MG tablet TAKE 1 TABLET BY MOUTH 2 TIMES DAILY. 02/26/18   Breeback, Luvenia Starch L, PA-C  potassium chloride SA (K-DUR,KLOR-CON) 20 MEQ tablet Take 1 tablet (20 mEq total) by mouth daily with breakfast. 02/26/18   Breeback, Jade L, PA-C  pregabalin (LYRICA) 25 MG capsule Take 1 capsule (25 mg total) by mouth 2 (two) times daily. 01/16/17   Silverio Decamp, MD    Family History Family History  Problem Relation Age of Onset  . Stroke  Mother   . Cancer Sister   . Migraines Sister     Social History Social History   Tobacco Use  . Smoking status: Never Smoker  . Smokeless tobacco: Never Used  Substance Use Topics  . Alcohol use: No  . Drug use: No     Allergies   Patient has no known allergies.   Review of Systems Review of Systems  Unable to perform ROS: Mental status change     Physical Exam Updated Vital Signs BP (!) 128/116 (BP Location: Left Arm)   Pulse (!) 103   Temp 97.7 F (36.5 C)   Resp (!) 22   Wt 88.9 kg (196 lb)   LMP  (LMP Unknown)   SpO2 98%   BMI 33.64 kg/m   Physical Exam  Constitutional: She appears well-developed and well-nourished. No distress.  HENT:  Head: Normocephalic and atraumatic.  Right Ear: External ear normal.  Left Ear: External ear normal.  Nose: Nose normal.  Eyes: Pupils are equal, round, and reactive to light. Right eye exhibits no discharge. Left eye exhibits no discharge.  Cardiovascular: Normal rate, regular rhythm and normal heart sounds.  Pulmonary/Chest: Effort normal and breath sounds normal. She has no wheezes. She has no rales.  Abdominal: Soft. There is tenderness in the suprapubic area.  Musculoskeletal: She exhibits edema (trace, BLE).  Neurological: She is alert.  Patient is awake and alert.  Sometimes she will answer no when asked to good questions, especially when asked to do something.  Otherwise she will have a delayed response and then respond with I am a ghost or I am an alien.  She does not follow commands for neurologic testing.  Skin: Skin is warm and dry. She is not diaphoretic.  Nursing note and vitals reviewed.    ED Treatments / Results  Labs (all labs ordered are listed, but only abnormal results are displayed) Labs Reviewed  COMPREHENSIVE METABOLIC PANEL - Abnormal; Notable for the following components:      Result Value   Sodium 129 (*)    Chloride 92 (*)    Glucose, Bld 118 (*)    Calcium 8.8 (*)    Albumin 3.1 (*)     AST 223 (*)    ALT 251 (*)    Alkaline Phosphatase 588 (*)    Total Bilirubin 5.6 (*)    All other components within normal limits  CBC - Abnormal; Notable for the following components:   RBC 3.48 (*)    Hemoglobin 10.6 (*)    HCT 31.7 (*)    RDW 21.4 (*)    Platelets 91 (*)    All other components within normal limits  URINALYSIS, ROUTINE W REFLEX MICROSCOPIC - Abnormal; Notable for the following components:   Color,  Urine AMBER (*)    Ketones, ur 5 (*)    All other components within normal limits  AMMONIA - Abnormal; Notable for the following components:   Ammonia 47 (*)    All other components within normal limits  CBG MONITORING, ED - Abnormal; Notable for the following components:   Glucose-Capillary 105 (*)    All other components within normal limits  I-STAT CG4 LACTIC ACID, ED - Abnormal; Notable for the following components:   Lactic Acid, Venous 3.39 (*)    All other components within normal limits  CULTURE, BLOOD (ROUTINE X 2)  CULTURE, BLOOD (ROUTINE X 2)  CULTURE, EXPECTORATED SPUTUM-ASSESSMENT  GRAM STAIN  RESPIRATORY PANEL BY PCR  DIFFERENTIAL  HIV ANTIBODY (ROUTINE TESTING)  STREP PNEUMONIAE URINARY ANTIGEN  LEGIONELLA PNEUMOPHILA SEROGP 1 UR AG  D-DIMER, QUANTITATIVE (NOT AT [facility])  TROPONIN I  TROPONIN I  TROPONIN I  I-STAT BETA HCG BLOOD, ED ([facility], [facility], [facility] ONLY)  I-STAT CG4 LACTIC ACID, ED    EKG EKG Interpretation  Date/Time:  Saturday March 20 2018 17:35:22 EDT Ventricular Rate:  107 PR Interval:    QRS Duration: 92 QT Interval:  332 QTC Calculation: 443 R Axis:   42 Text Interpretation:  Sinus tachycardia ST changes similar to Nov 2013 rate is faster Confirmed by Sherwood Gambler [phone]) on 03/20/2018 5:41:42 PM   Radiology Dg Ankle Complete Left  Result Date: 03/20/2018 CLINICAL DATA:  Ankle pain. Stage IV colon cancer. No trauma history submitted. EXAM: LEFT ANKLE COMPLETE - 3+ VIEW COMPARISON:  None. FINDINGS: Diffuse soft tissue  swelling could be due to patient body habitus. No acute fracture or dislocation. Base of fifth metatarsal and talar dome intact. IMPRESSION: No acute osseous abnormality. Electronically Signed   By: Abigail Miyamoto M.D.   On: 03/20/2018 19:06   Dg Ankle Complete Right  Result Date: 03/20/2018 CLINICAL DATA:  Patient arrived via [facility] from home. Patient has had altered mental status and generalized weakness for the past day. Stage 4 colon cancer pt. Patient states to the EMS, "I am a ghost, I am a alien, I cant walk , I am white". PT c/o bilateral ankle pain. EXAM: RIGHT ANKLE - COMPLETE 3+ VIEW COMPARISON:  None. FINDINGS: Diffuse soft tissue swelling could be habitus induced. No acute fracture or dislocation. Base of fifth metatarsal and talar dome intact. No focal osseous lesion. IMPRESSION: No acute osseous abnormality. Electronically Signed   By: Abigail Miyamoto M.D.   On: 03/20/2018 19:07   Ct Head Wo Contrast  Result Date: 03/20/2018 CLINICAL DATA:  Altered consciousness.  Colon cancer. EXAM: CT HEAD WITHOUT CONTRAST TECHNIQUE: Contiguous axial images were obtained from the base of the skull through the vertex without intravenous contrast. COMPARISON:  10/12/2004 FINDINGS: Brain: Mild cerebral atrophy for age. Dural calcifications again identified. No mass lesion, hemorrhage, hydrocephalus, acute infarct, intra-axial, or extra-axial fluid collection. Vascular: No hyperdense vessel or unexpected calcification. Skull: Normal Sinuses/Orbits: Normal imaged portions of the orbits and globes. Clear paranasal sinuses and mastoid air cells. Other: None. IMPRESSION: Mild cerebral atrophy.  No acute intracranial abnormality. Electronically Signed   By: Abigail Miyamoto M.D.   On: 03/20/2018 17:40   US Abdomen Limited  Result Date: 03/20/2018 CLINICAL DATA:  47 y/o  F; abnormal liver function. EXAM: ULTRASOUND ABDOMEN LIMITED RIGHT UPPER QUADRANT COMPARISON:  None. FINDINGS: Gallbladder: Cholelithiasis. No gallbladder  wall thickening. No pericholecystic fluid. Negative sonographic Murphy's sign. Common bile duct: Diameter: 2.9 mm Liver: Diffuse heterogeneity of liver  parenchyma and nodular contour. Small volume of perihepatic ascites. Portal vein is patent on color Doppler imaging with normal direction of blood flow towards the liver. IMPRESSION: 1. Cholelithiasis.  No secondary findings of acute cholecystitis. 2. Nodular liver contour and heterogeneous echotexture compatible with cirrhosis. 3. Small volume of perihepatic ascites. Electronically Signed   By: Kristine Garbe M.D.   On: 03/20/2018 21:00   Dg Chest Portable 1 View  Result Date: 03/20/2018 CLINICAL DATA:  Cough. Altered mental status. History of stage IV colon carcinoma. EXAM: PORTABLE CHEST 1 VIEW COMPARISON:  07/28/2012 FINDINGS: Right lung base opacity partly silhouettes the right hemidiaphragm. Remainder of the lungs is clear. Cardiac silhouette is normal in size. No mediastinal or hilar masses. Right anterior chest wall power Port-A-Cath has its tip in the mid superior vena cava. Skeletal structures are intact. IMPRESSION: 1. Right lung base opacity consistent with pneumonia. Electronically Signed   By: Lajean Manes M.D.   On: 03/20/2018 17:41    Procedures .Critical Care Performed by: Sherwood Gambler, MD Authorized by: Sherwood Gambler, MD   Critical care provider statement:    Critical care time (minutes):  30   Critical care time was exclusive of:  Separately billable procedures and treating other patients   Critical care was necessary to treat or prevent imminent or life-threatening deterioration of the following conditions:  Circulatory failure, shock, sepsis and respiratory failure   Critical care was time spent personally by me on the following activities:  Development of treatment plan with patient or surrogate, discussions with consultants, evaluation of patient's response to treatment, examination of patient, obtaining history  from patient or surrogate, ordering and performing treatments and interventions, ordering and review of laboratory studies, ordering and review of radiographic studies, pulse oximetry, re-evaluation of patient's condition and review of old charts   (including critical care time)  Medications Ordered in ED Medications  enoxaparin (LOVENOX) injection 40 mg (40 mg Subcutaneous Given 03/20/18 2123)  0.9 %  sodium chloride infusion ( Intravenous New Bag/Given 03/20/18 2124)  carvedilol (COREG) tablet 6.25 mg (has no administration in time range)  azithromycin (ZITHROMAX) 500 mg in sodium chloride 0.9 % 250 mL IVPB (500 mg Intravenous New Bag/Given 03/20/18 2134)  ceFEPIme (MAXIPIME) 2 g in sodium chloride 0.9 % 100 mL IVPB (has no administration in time range)  vancomycin (VANCOCIN) IVPB 750 mg/150 ml premix (has no administration in time range)  ceFEPIme (MAXIPIME) 2 g in sodium chloride 0.9 % 100 mL IVPB (0 g Intravenous Stopped 03/20/18 2037)  vancomycin (VANCOCIN) 2,000 mg in sodium chloride 0.9 % 500 mL IVPB (0 mg Intravenous Stopped 03/20/18 2037)  sodium chloride 0.9 % bolus 1,000 mL (0 mLs Intravenous Stopped 03/20/18 2037)     Initial Impression / Assessment and Plan / ED Course  I have reviewed the triage vital signs and the nursing notes.  Pertinent labs & imaging results that were available during my care of the patient were reviewed by me and considered in my medical decision making (see chart for details).     Patient is altered.  There might be a psychiatric component given she keeps repeating how she is a ghost and an alien.  She might be in shock as recently her oncologist indicated she could die within the week.  However she also appears to have a right-sided pneumonia along with some cough.  She does appear to have worsening liver function.  Her vital signs are stable except for mild tachycardia.  She has been  treated broadly as she would be a healthcare-associated pneumonia.  She is  maintaining her airway.  I did discuss with the sister how serious her oncologist feels like the patient's condition is.  Dr. Maudie Mercury will admit.  Final Clinical Impressions(s) / ED Diagnoses   Final diagnoses:  HCAP (healthcare-associated pneumonia)  Severe sepsis [facility])    ED Discharge Orders    None       Sherwood Gambler, MD 03/20/18 2230

## 2018-03-20 NOTE — ED Notes (Signed)
Patient transported to X-ray 

## 2018-03-21 ENCOUNTER — Inpatient Hospital Stay (HOSPITAL_COMMUNITY): Payer: BLUE CROSS/BLUE SHIELD

## 2018-03-21 ENCOUNTER — Encounter (HOSPITAL_COMMUNITY): Payer: Self-pay | Admitting: Radiology

## 2018-03-21 LAB — LACTATE DEHYDROGENASE, PLEURAL OR PERITONEAL FLUID: LD, Fluid: 3055 U/L — ABNORMAL HIGH (ref 3–23)

## 2018-03-21 LAB — BODY FLUID CELL COUNT WITH DIFFERENTIAL
Eos, Fluid: 0 %
LYMPHS FL: 68 %
MONOCYTE-MACROPHAGE-SEROUS FLUID: 21 % — AB (ref 50–90)
NEUTROPHIL FLUID: 11 % (ref 0–25)
Total Nucleated Cell Count, Fluid: 411 cu mm (ref 0–1000)

## 2018-03-21 LAB — RESPIRATORY PANEL BY PCR
Adenovirus: NOT DETECTED
BORDETELLA PERTUSSIS-RVPCR: NOT DETECTED
CHLAMYDOPHILA PNEUMONIAE-RVPPCR: NOT DETECTED
Coronavirus 229E: NOT DETECTED
Coronavirus HKU1: NOT DETECTED
Coronavirus NL63: NOT DETECTED
Coronavirus OC43: NOT DETECTED
INFLUENZA B-RVPPCR: NOT DETECTED
Influenza A: NOT DETECTED
METAPNEUMOVIRUS-RVPPCR: NOT DETECTED
Mycoplasma pneumoniae: NOT DETECTED
PARAINFLUENZA VIRUS 2-RVPPCR: NOT DETECTED
Parainfluenza Virus 1: NOT DETECTED
Parainfluenza Virus 3: NOT DETECTED
Parainfluenza Virus 4: NOT DETECTED
RESPIRATORY SYNCYTIAL VIRUS-RVPPCR: NOT DETECTED
RHINOVIRUS / ENTEROVIRUS - RVPPCR: NOT DETECTED

## 2018-03-21 LAB — PROTEIN, PLEURAL OR PERITONEAL FLUID: Total protein, fluid: 3.8 g/dL

## 2018-03-21 LAB — TROPONIN I
TROPONIN I: 0.04 ng/mL — AB (ref <0.03)
TROPONIN I: 0.04 ng/mL — AB (ref <0.03)

## 2018-03-21 LAB — LACTIC ACID, PLASMA
Lactic Acid, Venous: 1.8 mmol/L (ref 0.5–1.9)
Lactic Acid, Venous: 1.9 mmol/L (ref 0.5–1.9)

## 2018-03-21 LAB — HIV ANTIBODY (ROUTINE TESTING W REFLEX): HIV SCREEN 4TH GENERATION: NONREACTIVE

## 2018-03-21 LAB — STREP PNEUMONIAE URINARY ANTIGEN: Strep Pneumo Urinary Antigen: NEGATIVE

## 2018-03-21 MED ORDER — IOPAMIDOL (ISOVUE-370) INJECTION 76%
100.0000 mL | Freq: Once | INTRAVENOUS | Status: AC | PRN
  Administered 2018-03-21: 100 mL via INTRAVENOUS

## 2018-03-21 MED ORDER — LIDOCAINE HCL 1 % IJ SOLN
INTRAMUSCULAR | Status: AC
  Filled 2018-03-21: qty 20

## 2018-03-21 MED ORDER — SODIUM CHLORIDE 0.9 % IV SOLN
INTRAVENOUS | Status: AC
  Administered 2018-03-21: 17:00:00 via INTRAVENOUS

## 2018-03-21 MED ORDER — LACTULOSE 10 GM/15ML PO SOLN
10.0000 g | Freq: Every day | ORAL | Status: DC
  Administered 2018-03-21 – 2018-03-24 (×3): 10 g via ORAL
  Filled 2018-03-21: qty 15
  Filled 2018-03-21 (×2): qty 30
  Filled 2018-03-21: qty 15

## 2018-03-21 MED ORDER — IOPAMIDOL (ISOVUE-370) INJECTION 76%
INTRAVENOUS | Status: AC
  Filled 2018-03-21: qty 100

## 2018-03-21 NOTE — Progress Notes (Signed)
CRITICAL VALUE ALERT  Critical Value:  Troponin 0.04  Date & Time Notied:  03/20/18@2309   Provider Notified: yes  Orders Received/Actions taken:

## 2018-03-21 NOTE — Procedures (Signed)
PROCEDURE SUMMARY:  Successful US guided paracentesis from [facility].  Yielded 120 mL of clear golden fluid.  No immediate complications.  Pt tolerated well.   Specimen was sent for labs.  Ascencion Dike PA-C 03/21/2018 1:33 PM

## 2018-03-21 NOTE — Progress Notes (Signed)
PROGRESS NOTE    Brittney Young  DTO:[id number] DOB: 1971-06-12 DOA: 03/20/2018 PCP: Donella Stade, PA-C   Brief Narrative: Brittney Young is a 47 y.o. female with a history of metastatic adenocarcinoma of the colon, with mets to the liver.  Patient presented with confusion.  Unknown etiology   Assessment & Plan:   Principal Problem:   Pneumonia Active Problems:   Sepsis ([place])   Tachycardia   Altered mental status Seems secondary to hepatic encephalopathy, although possibly multifactorial. ?SBP. Ammonia elevated. -Continue lactulose -EEG -MRI pending -Paracentesis with labs -We will obtain a urine culture as per patient's sister, she was complaining of dysuria (urinalysis was unremarkable)  Sepsis Unknown source currently. Blood cultures pending -Continue Vancomycin/cefepime  Sinus tachycardia Likely secondary to dehydration. -IV fluids  Pneumonia Diagnosed secondary to chest x-ray, however, no evidence on CT scan of the chest -Discontinue azithromycin  Ascites -Paracentesis as mentioned above  Metastatic adenocarcinoma of the colon Patient follows with [facility]. Liver lesions. Last received chemotherapy on 03/15/18 (5FU/LV and Avastin). Per chart review, oncology plans to no longer offer chemotherapy as patient has significantly declined. Plan was for hospice care at home. -2g sodium diet  Hyponatremia Chronic.  Noted on care everywhere labs.  Improved from 6 days ago. -Continue IV fluids  DVT prophylaxis: Lovenox Code Status:   Code Status: Full Code Family Communication: Sister via telephone Disposition Plan: Discharge pending improvement of AMS   Consultants:   NOne  Procedures:   None  Antimicrobials:  Vancomycin  Cefepime  Azithromycin    Subjective: Patient answers yes to everything.  Objective: Vitals:   03/20/18 2103 03/21/18 0509 03/21/18 1305 03/21/18 1323  BP: (!) 128/116 122/85 127/87 (!) 134/92  Pulse: (!)  103 100    Resp: (!) 22 18    Temp: 97.7 F (36.5 C) 98.7 F (37.1 C)    TempSrc:  Oral    SpO2: 98% 100%    Weight:        Intake/Output Summary (Last 24 hours) at 03/21/2018 1458 Last data filed at 03/21/2018 0612 Gross per 24 hour  Intake 2717.5 ml  Output 300 ml  Net 2417.5 ml   Filed Weights   03/20/18 1803  Weight: 88.9 kg (196 lb)    Examination:  General exam: Appears calm and comfortable Respiratory system: Clear to auscultation. Respiratory effort normal. Cardiovascular system: S1 & S2 heard, RRR. No murmurs, rubs, gallops or clicks. Gastrointestinal system: Abdomen is nondistended, soft and tender. Decreased bowel sounds heard. Central nervous system: Alert and not oriented. Follows commands Extremities: No calf tenderness Skin: No cyanosis. No rashes Psychiatry: Judgement and insight appear impaired. Flat affect    Data Reviewed: I have personally reviewed following labs and imaging studies  CBC: Recent Labs  Lab 03/20/18 1710  WBC 7.7  NEUTROABS 6.7  HGB 10.6*  HCT 31.7*  MCV 91.1  PLT 91*   Basic Metabolic Panel: Recent Labs  Lab 03/20/18 1710  NA 129*  K 4.2  CL 92*  CO2 23  GLUCOSE 118*  BUN 20  CREATININE 0.73  CALCIUM 8.8*   GFR: Estimated Creatinine Clearance: 95.9 mL/min (by C-G formula based on SCr of 0.73 mg/dL). Liver Function Tests: Recent Labs  Lab 03/20/18 1710  AST 223*  ALT 251*  ALKPHOS 588*  BILITOT 5.6*  PROT 7.7  ALBUMIN 3.1*   No results for input(s): LIPASE, AMYLASE in the last 168 hours. Recent Labs  Lab 03/20/18 2039  AMMONIA 47*  Coagulation Profile: No results for input(s): INR, PROTIME in the last 168 hours. Cardiac Enzymes: Recent Labs  Lab 03/20/18 2202 03/21/18 0454 03/21/18 1001  TROPONINI 0.04* 0.04* 0.04*   BNP (last 3 results) No results for input(s): PROBNP in the last 8760 hours. HbA1C: No results for input(s): HGBA1C in the last 72 hours. CBG: Recent Labs  Lab 03/20/18 1738   GLUCAP 105*   Lipid Profile: No results for input(s): CHOL, HDL, LDLCALC, TRIG, CHOLHDL, LDLDIRECT in the last 72 hours. Thyroid Function Tests: No results for input(s): TSH, T4TOTAL, FREET4, T3FREE, THYROIDAB in the last 72 hours. Anemia Panel: No results for input(s): VITAMINB12, FOLATE, FERRITIN, TIBC, IRON, RETICCTPCT in the last 72 hours. Sepsis Labs: Recent Labs  Lab 03/20/18 1816 03/21/18 0103 03/21/18 0454  LATICACIDVEN 3.39* 1.8 1.9    No results found for this or any previous visit (from the past 240 hour(s)).       Radiology Studies: Dg Ankle Complete Left  Result Date: 03/20/2018 CLINICAL DATA:  Ankle pain. Stage IV colon cancer. No trauma history submitted. EXAM: LEFT ANKLE COMPLETE - 3+ VIEW COMPARISON:  None. FINDINGS: Diffuse soft tissue swelling could be due to patient body habitus. No acute fracture or dislocation. Base of fifth metatarsal and talar dome intact. IMPRESSION: No acute osseous abnormality. Electronically Signed   By: Abigail Miyamoto M.D.   On: 03/20/2018 19:06   Dg Ankle Complete Right  Result Date: 03/20/2018 CLINICAL DATA:  Patient arrived via [facility] from home. Patient has had altered mental status and generalized weakness for the past day. Stage 4 colon cancer pt. Patient states to the EMS, "I am a ghost, I am a alien, I cant walk , I am white". PT c/o bilateral ankle pain. EXAM: RIGHT ANKLE - COMPLETE 3+ VIEW COMPARISON:  None. FINDINGS: Diffuse soft tissue swelling could be habitus induced. No acute fracture or dislocation. Base of fifth metatarsal and talar dome intact. No focal osseous lesion. IMPRESSION: No acute osseous abnormality. Electronically Signed   By: Abigail Miyamoto M.D.   On: 03/20/2018 19:07   Ct Head Wo Contrast  Result Date: 03/20/2018 CLINICAL DATA:  Altered consciousness.  Colon cancer. EXAM: CT HEAD WITHOUT CONTRAST TECHNIQUE: Contiguous axial images were obtained from the base of the skull through the vertex without intravenous  contrast. COMPARISON:  10/12/2004 FINDINGS: Brain: Mild cerebral atrophy for age. Dural calcifications again identified. No mass lesion, hemorrhage, hydrocephalus, acute infarct, intra-axial, or extra-axial fluid collection. Vascular: No hyperdense vessel or unexpected calcification. Skull: Normal Sinuses/Orbits: Normal imaged portions of the orbits and globes. Clear paranasal sinuses and mastoid air cells. Other: None. IMPRESSION: Mild cerebral atrophy.  No acute intracranial abnormality. Electronically Signed   By: Abigail Miyamoto M.D.   On: 03/20/2018 17:40   Ct Angio Chest Pe W Or Wo Contrast  Result Date: 03/21/2018 CLINICAL DATA:  Altered mental status with elevated D-dimer levels. Evaluate for acute pulmonary embolism. Metastatic colon cancer, chemotherapy ongoing. Cough. EXAM: CT ANGIOGRAPHY CHEST WITH CONTRAST TECHNIQUE: Multidetector CT imaging of the chest was performed using the standard protocol during bolus administration of intravenous contrast. Multiplanar CT image reconstructions and MIPs were obtained to evaluate the vascular anatomy. CONTRAST:  138mL ISOVUE-370 IOPAMIDOL (ISOVUE-370) INJECTION 76% COMPARISON:  Chest radiographs 03/20/2018. Abdominal ultrasound 03/20/2018. Limited correlation made with lumbar MRI 11/17/2016. FINDINGS: Cardiovascular: The pulmonary arteries are well opacified with contrast to the level of the subsegmental branches. There is no evidence of acute pulmonary embolism. Mild aortic atherosclerosis without acute vascular  findings. Right IJ Port-A-Cath extends to the superior cavoatrial junction. The heart size is normal. There is no pericardial effusion. Mediastinum/Nodes: There are no enlarged mediastinal, hilar, axillary or internal mammary lymph nodes. The thyroid gland, trachea and esophagus demonstrate no significant findings. Lungs/Pleura: There are small right-greater-than-left dependent pleural effusions. Mild dependent atelectasis is present in both lungs. There  is no confluent airspace opacity or suspicious pulmonary nodule. Upper abdomen: The liver is enlarged with numerous ill-defined low-density masses, most consistent with widespread metastatic disease. Representative lesion measures 5.1 cm in the right lobe on image 68/4. There is some upper abdominal ascites. Musculoskeletal/Chest wall: Peripheral left breast mass is incompletely visualized, measuring up to 4.1 cm on image 122/7. No other chest wall masses are seen. There are no suspicious osseous findings. Review of the MIP images confirms the above findings. IMPRESSION: 1. No evidence of acute pulmonary embolism. 2. Bilateral pleural effusions with bibasilar atelectasis. 3. Widespread hepatic metastatic disease. If this has not been previously documented elsewhere, consider dedicated abdominopelvic CT for further staging. 4. Indeterminate left breast mass. This apparently has been previously evaluated by mammography, ultrasound and biopsy in 2015. 5. No evidence of thoracic nodal, pulmonary or osseous metastases. Electronically Signed   By: Richardean Sale M.D.   On: 03/21/2018 02:36   US Abdomen Limited  Result Date: 03/20/2018 CLINICAL DATA:  47 y/o  F; abnormal liver function. EXAM: ULTRASOUND ABDOMEN LIMITED RIGHT UPPER QUADRANT COMPARISON:  None. FINDINGS: Gallbladder: Cholelithiasis. No gallbladder wall thickening. No pericholecystic fluid. Negative sonographic Murphy's sign. Common bile duct: Diameter: 2.9 mm Liver: Diffuse heterogeneity of liver parenchyma and nodular contour. Small volume of perihepatic ascites. Portal vein is patent on color Doppler imaging with normal direction of blood flow towards the liver. IMPRESSION: 1. Cholelithiasis.  No secondary findings of acute cholecystitis. 2. Nodular liver contour and heterogeneous echotexture compatible with cirrhosis. 3. Small volume of perihepatic ascites. Electronically Signed   By: Kristine Garbe M.D.   On: 03/20/2018 21:00   US  Paracentesis  Result Date: 03/21/2018 INDICATION: Metastatic colorectal cancer. Abdominal discomfort. Small volume ascites noted on prior imaging studies. Request for diagnostic paracentesis. EXAM: ULTRASOUND GUIDED LEFT UPPER QUADRANT PARACENTESIS MEDICATIONS: None. COMPLICATIONS: None immediate. PROCEDURE: Informed written consent was obtained from the patient after a discussion of the risks, benefits and alternatives to treatment. A timeout was performed prior to the initiation of the procedure. Initial ultrasound scanning demonstrates a small amount of ascites within the left upper abdominal quadrant. The left upper abdomen was prepped and draped in the usual sterile fashion. 1% lidocaine with epinephrine was used for local anesthesia. Following this, a 19 gauge, 7-cm, Yueh catheter was introduced. An ultrasound image was saved for documentation purposes. The paracentesis was performed. The catheter was removed and a dressing was applied. The patient tolerated the procedure well without immediate post procedural complication. FINDINGS: A total of approximately only 120 mL of clear, golden fluid was removed. Samples were sent to the laboratory as requested by the clinical team. IMPRESSION: Successful ultrasound-guided paracentesis yielding 120 mL of peritoneal fluid. Read by: Ascencion Dike PA-C Electronically Signed   By: Jacqulynn Cadet M.D.   On: 03/21/2018 13:32   Dg Chest Portable 1 View  Result Date: 03/20/2018 CLINICAL DATA:  Cough. Altered mental status. History of stage IV colon carcinoma. EXAM: PORTABLE CHEST 1 VIEW COMPARISON:  07/28/2012 FINDINGS: Right lung base opacity partly silhouettes the right hemidiaphragm. Remainder of the lungs is clear. Cardiac silhouette is normal in size. No  mediastinal or hilar masses. Right anterior chest wall power Port-A-Cath has its tip in the mid superior vena cava. Skeletal structures are intact. IMPRESSION: 1. Right lung base opacity consistent with  pneumonia. Electronically Signed   By: Lajean Manes M.D.   On: 03/20/2018 17:41        Scheduled Meds: . citalopram  20 mg Oral Daily  . enoxaparin (LOVENOX) injection  40 mg Subcutaneous Q24H  . Ferrous Fumarate  1 tablet Oral BID AC  . hydrochlorothiazide  25 mg Oral Daily  . lactulose  10 g Oral Daily  . lidocaine      . metoprolol tartrate  25 mg Oral BID  . potassium chloride SA  20 mEq Oral Q breakfast   Continuous Infusions: . sodium chloride    . azithromycin Stopped (03/20/18 2234)  . ceFEPime (MAXIPIME) IV 2 g (03/21/18 1348)  . vancomycin Stopped (03/21/18 1287)     LOS: 1 day     Cordelia Poche, MD [facility] 03/21/2018, 2:58 PM Pager: [phone]  If 7PM-7AM, please contact night-coverage [email or web address] 03/21/2018, 2:58 PM

## 2018-03-22 ENCOUNTER — Inpatient Hospital Stay (HOSPITAL_COMMUNITY): Payer: BLUE CROSS/BLUE SHIELD

## 2018-03-22 ENCOUNTER — Inpatient Hospital Stay (HOSPITAL_COMMUNITY)
Admit: 2018-03-22 | Discharge: 2018-03-22 | Disposition: A | Discharge status: Home / Self Care | Payer: BLUE CROSS/BLUE SHIELD | Attending: Family Medicine | Admitting: Family Medicine

## 2018-03-22 DIAGNOSIS — R188 Other ascites: Secondary | ICD-10-CM

## 2018-03-22 DIAGNOSIS — R4182 Altered mental status, unspecified: Secondary | ICD-10-CM

## 2018-03-22 DIAGNOSIS — E871 Hypo-osmolality and hyponatremia: Secondary | ICD-10-CM

## 2018-03-22 LAB — LEGIONELLA PNEUMOPHILA SEROGP 1 UR AG: L. PNEUMOPHILA SEROGP 1 UR AG: NEGATIVE

## 2018-03-22 LAB — CBC
HEMATOCRIT: 25.6 % — AB (ref 36.0–46.0)
Hemoglobin: 8.6 g/dL — ABNORMAL LOW (ref 12.0–15.0)
MCH: 31.6 pg (ref 26.0–34.0)
MCHC: 33.6 g/dL (ref 30.0–36.0)
MCV: 94.1 fL (ref 78.0–100.0)
Platelets: 59 10*3/uL — ABNORMAL LOW (ref 150–400)
RBC: 2.72 MIL/uL — ABNORMAL LOW (ref 3.87–5.11)
RDW: 21.9 % — ABNORMAL HIGH (ref 11.5–15.5)
WBC: 9.6 10*3/uL (ref 4.0–10.5)

## 2018-03-22 LAB — OSMOLALITY: OSMOLALITY: 265 mosm/kg — AB (ref 275–295)

## 2018-03-22 LAB — BASIC METABOLIC PANEL
Anion gap: 11 (ref 5–15)
BUN: 14 mg/dL (ref 6–20)
CALCIUM: 8 mg/dL — AB (ref 8.9–10.3)
CO2: 20 mmol/L — ABNORMAL LOW (ref 22–32)
Chloride: 95 mmol/L — ABNORMAL LOW (ref 98–111)
Creatinine, Ser: 0.51 mg/dL (ref 0.44–1.00)
GFR calc Af Amer: >60 mL/min (ref >60)
GLUCOSE: 97 mg/dL (ref 70–99)
POTASSIUM: 3.4 mmol/L — AB (ref 3.5–5.1)
Sodium: 126 mmol/L — ABNORMAL LOW (ref 135–145)

## 2018-03-22 LAB — GRAM STAIN

## 2018-03-22 LAB — COMPREHENSIVE METABOLIC PANEL
ALBUMIN: 2.8 g/dL — AB (ref 3.5–5.0)
ALK PHOS: 513 U/L — AB (ref 38–126)
ALT: 160 U/L — AB (ref 0–44)
AST: 140 U/L — AB (ref 15–41)
Anion gap: 17 — ABNORMAL HIGH (ref 5–15)
BILIRUBIN TOTAL: 3.9 mg/dL — AB (ref 0.3–1.2)
BUN: 15 mg/dL (ref 6–20)
CO2: 19 mmol/L — ABNORMAL LOW (ref 22–32)
Calcium: 8.7 mg/dL — ABNORMAL LOW (ref 8.9–10.3)
Chloride: 93 mmol/L — ABNORMAL LOW (ref 98–111)
Creatinine, Ser: 0.5 mg/dL (ref 0.44–1.00)
GFR calc Af Amer: >60 mL/min (ref >60)
GFR calc non Af Amer: >60 mL/min (ref >60)
GLUCOSE: 110 mg/dL — AB (ref 70–99)
POTASSIUM: 3.6 mmol/L (ref 3.5–5.1)
Sodium: 129 mmol/L — ABNORMAL LOW (ref 135–145)
TOTAL PROTEIN: 7.5 g/dL (ref 6.5–8.1)

## 2018-03-22 LAB — TSH: TSH: 3.567 u[IU]/mL (ref 0.350–4.500)

## 2018-03-22 LAB — VITAMIN B12: VITAMIN B 12: 1059 pg/mL — AB (ref 180–914)

## 2018-03-22 LAB — AMMONIA: AMMONIA: 24 umol/L (ref 9–35)

## 2018-03-22 MED ORDER — LORAZEPAM 2 MG/ML IJ SOLN: 0.5000 mg | Freq: Once | INTRAMUSCULAR | Status: DC | PRN

## 2018-03-22 MED ORDER — FUROSEMIDE 10 MG/ML IJ SOLN
40.0000 mg | Freq: Once | INTRAMUSCULAR | Status: AC
  Administered 2018-03-22: 40 mg via INTRAVENOUS

## 2018-03-22 MED ORDER — LORAZEPAM 2 MG/ML IJ SOLN: 1.0000 mg | INTRAMUSCULAR | Status: DC | PRN

## 2018-03-22 MED ORDER — HYDROCODONE-ACETAMINOPHEN 5-325 MG PO TABS
1.0000 | ORAL_TABLET | Freq: Four times a day (QID) | ORAL | Status: DC | PRN
  Administered 2018-03-23: 2 via ORAL
  Administered 2018-03-25: 1 via ORAL
  Administered 2018-03-28: 2 via ORAL
  Filled 2018-03-22: qty 2
  Filled 2018-03-22: qty 1
  Filled 2018-03-22: qty 2
  Filled 2018-03-22: qty 1

## 2018-03-22 MED ORDER — LEVETIRACETAM IN NACL 500 MG/100ML IV SOLN
500.0000 mg | Freq: Two times a day (BID) | INTRAVENOUS | Status: DC
  Administered 2018-03-23 – 2018-03-29 (×13): 500 mg via INTRAVENOUS
  Filled 2018-03-22 (×16): qty 100

## 2018-03-22 MED ORDER — GADOBENATE DIMEGLUMINE 529 MG/ML IV SOLN
20.0000 mL | Freq: Once | INTRAVENOUS | Status: AC | PRN
  Administered 2018-03-22: 18 mL via INTRAVENOUS

## 2018-03-22 MED ORDER — LEVETIRACETAM IN NACL 1000 MG/100ML IV SOLN
1000.0000 mg | Freq: Once | INTRAVENOUS | Status: AC
  Administered 2018-03-22: 1000 mg via INTRAVENOUS
  Filled 2018-03-22: qty 100

## 2018-03-22 NOTE — Progress Notes (Addendum)
PROGRESS NOTE    Brittney Young  PIR:[id number] DOB: 1971-05-01 DOA: 03/20/2018 PCP: Donella Stade, PA-C   Brief Narrative: Brittney Young is a 47 y.o. female with a history of metastatic adenocarcinoma of the colon, with mets to the liver.  Patient presented with confusion.  Unknown etiology. Workup pending   Assessment & Plan:   Principal Problem:   Pneumonia Active Problems:   Sepsis ([place])   Tachycardia   Altered mental status Seems secondary to hepatic encephalopathy, although possibly multifactorial. ?SBP. Ammonia elevated. Gram stain on paracentesis with no organisms. No growth to date on blood, ascites culture. Urine culture pending. ?concern for seizure-like activity -Continue lactulose -EEG pending -MRI pending -SLP eval -Neuro consult  Sepsis Unknown source currently. Blood, urine, ascites fluid cultures pending -Continue Vancomycin/cefepime  Sinus tachycardia Likely secondary to dehydration.  Pneumonia Diagnosed secondary to chest x-ray, however, no evidence on CT scan of the chest.  Ascites -Paracentesis as mentioned above  Metastatic adenocarcinoma of the colon Patient follows with [facility]. Liver lesions. Last received chemotherapy on 03/15/18 (5FU/LV and Avastin). Per chart review, oncology plans to no longer offer chemotherapy as patient has significantly declined. Plan was for hospice care at home.  Hyponatremia Chronic.  Noted on care everywhere labs.  Improved from 6 days ago, however, acutely worsened with IV fluids. Patient with ascites and some LE edema. -Lasix x1 -Repeat BMP this afternoon -Fluid restrict  Thrombocytopenia Appears secondary to chemotherapy. Last normal on 03/15/18 (day of chemo) and down to 76 three days later. Trended down during this admission. Hemoglobin down in setting of IV fluids. No evidence of bleeding currently. -Repeat CBC  Anemia Chronic. Baseline appears to be around 9 per chart review on Care  Everywhere. Trended down in setting of hydration earlier in admission. No evidence of bleeding. Also in setting of colon cancer. -Watch CBC  Addendum: Discussed results of EEG with neurology. Concern for seizure activity. Patient already loaded with Keppra. Plan for transfer to [facility] for prolonged EEG. Patient also found to have an acute stroke. Neurology aware and will manage when patient arrive at [facility]. Family updated on results.  DVT prophylaxis: Lovenox Code Status:   Code Status: Full Code Family Communication: Sisters at bedside Disposition Plan: Discharge pending improvement of AMS   Consultants:   Neurology  Palliative care  Procedures:   EEG (pending)  Antimicrobials:  Vancomycin  Cefepime  Azithromycin    Subjective: Patient does not answer questions appropriately.  Objective: Vitals:   03/21/18 1323 03/21/18 1630 03/21/18 2135 03/22/18 0528  BP: (!) 134/92  124/79 (!) 131/94  Pulse:   (!) 108 (!) 103  Resp:   20 18  Temp:  98.3 F (36.8 C) 98.8 F (37.1 C) (!) 97.4 F (36.3 C)  TempSrc:  Oral Oral Oral  SpO2:   99% 98%  Weight:        Intake/Output Summary (Last 24 hours) at 03/22/2018 1150 Last data filed at 03/22/2018 6301 Gross per 24 hour  Intake 1391.25 ml  Output 365 ml  Net 1026.25 ml   Filed Weights   03/20/18 1803  Weight: 88.9 kg (196 lb)    Examination:  General exam: Appears calm and comfortable Respiratory system: Clear to auscultation. Respiratory effort normal. Cardiovascular system: S1 & S2 heard, RRR. No murmurs, rubs, gallops or clicks. Gastrointestinal system: Abdomen is nondistended, soft and nontender. No organomegaly or masses felt. Normal bowel sounds heard. Central nervous system: Alert and not  oriented. No focal neurological deficits but exam limited. Obeys commands to squeeze hands Extremities: 1+ LE edema. No calf tenderness Skin: No cyanosis. No rashes Psychiatry: Judgement and  insight appear impaired. Flat affect.   Data Reviewed: I have personally reviewed following labs and imaging studies  CBC: Recent Labs  Lab 03/20/18 1710 03/22/18 0447  WBC 7.7 9.6  NEUTROABS 6.7  --   HGB 10.6* 8.6*  HCT 31.7* 25.6*  MCV 91.1 94.1  PLT 91* 59*   Basic Metabolic Panel: Recent Labs  Lab 03/20/18 1710 03/22/18 0447  NA 129* 126*  K 4.2 3.4*  CL 92* 95*  CO2 23 20*  GLUCOSE 118* 97  BUN 20 14  CREATININE 0.73 0.51  CALCIUM 8.8* 8.0*   GFR: Estimated Creatinine Clearance: 95.9 mL/min (by C-G formula based on SCr of 0.51 mg/dL). Liver Function Tests: Recent Labs  Lab 03/20/18 1710  AST 223*  ALT 251*  ALKPHOS 588*  BILITOT 5.6*  PROT 7.7  ALBUMIN 3.1*   No results for input(s): LIPASE, AMYLASE in the last 168 hours. Recent Labs  Lab 03/20/18 2039  AMMONIA 47*   Coagulation Profile: No results for input(s): INR, PROTIME in the last 168 hours. Cardiac Enzymes: Recent Labs  Lab 03/20/18 2202 03/21/18 0454 03/21/18 1001  TROPONINI 0.04* 0.04* 0.04*   BNP (last 3 results) No results for input(s): PROBNP in the last 8760 hours. HbA1C: No results for input(s): HGBA1C in the last 72 hours. CBG: Recent Labs  Lab 03/20/18 1738  GLUCAP 105*   Lipid Profile: No results for input(s): CHOL, HDL, LDLCALC, TRIG, CHOLHDL, LDLDIRECT in the last 72 hours. Thyroid Function Tests: No results for input(s): TSH, T4TOTAL, FREET4, T3FREE, THYROIDAB in the last 72 hours. Anemia Panel: No results for input(s): VITAMINB12, FOLATE, FERRITIN, TIBC, IRON, RETICCTPCT in the last 72 hours. Sepsis Labs: Recent Labs  Lab 03/20/18 1816 03/21/18 0103 03/21/18 0454  LATICACIDVEN 3.39* 1.8 1.9    Recent Results (from the past 240 hour(s))  Culture, blood (routine x 2)     Status: None (Preliminary result)   Collection Time: 03/20/18  5:49 PM  Result Value Ref Range Status   Specimen Description   Final    BLOOD RIGHT ANTECUBITAL Performed at [facility], [place] [address], [place], [place] [address]    Special Requests   Final    BOTTLES DRAWN AEROBIC AND ANAEROBIC Blood Culture adequate volume Performed at [facility] [address] [place], [place] [address]    Culture   Final    NO GROWTH 2 DAYS Performed at [facility] [address], [place], [place] [address]    Report Status PENDING  Incomplete  Culture, blood (routine x 2)     Status: None (Preliminary result)   Collection Time: 03/20/18  5:59 PM  Result Value Ref Range Status   Specimen Description   Final    BLOOD RIGHT ANTECUBITAL Performed at [facility] [address], [place], [place] [address]    Special Requests   Final    BOTTLES DRAWN AEROBIC AND ANAEROBIC Blood Culture adequate volume Performed at [facility] [address], [place], [place] [address]    Culture   Final    NO GROWTH 2 DAYS Performed at [place] [address] [place],  [address]    Report Status PENDING  Incomplete  Respiratory Panel by PCR     Status: None  Collection Time: 03/21/18  7:50 AM  Result Value Ref Range Status   Adenovirus NOT DETECTED NOT DETECTED Final   Coronavirus 229E NOT DETECTED NOT DETECTED Final   Coronavirus HKU1 NOT DETECTED NOT DETECTED Final   Coronavirus NL63 NOT DETECTED NOT DETECTED Final   Coronavirus OC43 NOT DETECTED NOT DETECTED Final   Metapneumovirus NOT DETECTED NOT DETECTED Final   Rhinovirus / Enterovirus NOT DETECTED NOT DETECTED Final   Influenza A NOT DETECTED NOT DETECTED Final   Influenza B NOT DETECTED NOT DETECTED Final   Parainfluenza Virus 1 NOT DETECTED NOT DETECTED Final   Parainfluenza Virus 2 NOT DETECTED NOT DETECTED Final   Parainfluenza Virus 3 NOT DETECTED NOT DETECTED Final   Parainfluenza Virus 4 NOT DETECTED NOT DETECTED Final   Respiratory Syncytial Virus NOT DETECTED NOT DETECTED Final   Bordetella pertussis NOT DETECTED  NOT DETECTED Final   Chlamydophila pneumoniae NOT DETECTED NOT DETECTED Final   Mycoplasma pneumoniae NOT DETECTED NOT DETECTED Final    Comment: Performed at [facility], [place] [address] [place], [place] [address]  Culture, body fluid-bottle     Status: None (Preliminary result)   Collection Time: 03/21/18  1:42 PM  Result Value Ref Range Status   Specimen Description PERITONEAL  Final   Special Requests NONE  Final   Culture   Final    NO GROWTH < 24 HOURS Performed at [facility], [place] [address], [place], [place] [address]    Report Status PENDING  Incomplete  Gram stain     Status: None   Collection Time: 03/21/18  1:42 PM  Result Value Ref Range Status   Specimen Description PERITONEAL  Final   Special Requests NONE  Final   Gram Stain   Final    RARE WBC PRESENT, PREDOMINANTLY PMN NO ORGANISMS SEEN Performed at [facility], [place] [address], [place],  [address]    Report Status 03/22/2018 FINAL  Final         Radiology Studies: Dg Knee 1-2 Views Left  Result Date: 03/21/2018 CLINICAL DATA:  Left knee pain. EXAM: LEFT KNEE - 1-2 VIEW COMPARISON:  None. FINDINGS: No fracture.  No bone lesion. Knee joint is normally spaced and aligned. No arthropathic changes. No joint effusion. Soft tissues are unremarkable. IMPRESSION: Negative. Electronically Signed   By: Lajean Manes M.D.   On: 03/21/2018 18:16   Dg Ankle Complete Left  Result Date: 03/20/2018 CLINICAL DATA:  Ankle pain. Stage IV colon cancer. No trauma history submitted. EXAM: LEFT ANKLE COMPLETE - 3+ VIEW COMPARISON:  None. FINDINGS: Diffuse soft tissue swelling could be due to patient body habitus. No acute fracture or dislocation. Base of fifth metatarsal and talar dome intact. IMPRESSION: No acute osseous abnormality. Electronically Signed   By: Abigail Miyamoto M.D.   On: 03/20/2018 19:06   Dg Ankle Complete Right  Result Date: 03/20/2018 CLINICAL DATA:  Patient arrived via [facility] from  home. Patient has had altered mental status and generalized weakness for the past day. Stage 4 colon cancer pt. Patient states to the EMS, "I am a ghost, I am a alien, I cant walk , I am white". PT c/o bilateral ankle pain. EXAM: RIGHT ANKLE - COMPLETE 3+ VIEW COMPARISON:  None. FINDINGS: Diffuse soft tissue swelling could be habitus induced. No acute fracture or dislocation. Base of fifth metatarsal and talar dome intact. No focal osseous lesion. IMPRESSION: No acute osseous abnormality. Electronically Signed   By: Marylyn Ishihara  Jobe Igo M.D.   On: 03/20/2018 19:07   Ct Head Wo Contrast  Result Date: 03/20/2018 CLINICAL DATA:  Altered consciousness.  Colon cancer. EXAM: CT HEAD WITHOUT CONTRAST TECHNIQUE: Contiguous axial images were obtained from the base of the skull through the vertex without intravenous contrast. COMPARISON:  10/12/2004 FINDINGS: Brain: Mild cerebral atrophy for age. Dural calcifications again identified. No mass lesion, hemorrhage, hydrocephalus, acute infarct, intra-axial, or extra-axial fluid collection. Vascular: No hyperdense vessel or unexpected calcification. Skull: Normal Sinuses/Orbits: Normal imaged portions of the orbits and globes. Clear paranasal sinuses and mastoid air cells. Other: None. IMPRESSION: Mild cerebral atrophy.  No acute intracranial abnormality. Electronically Signed   By: Abigail Miyamoto M.D.   On: 03/20/2018 17:40   Ct Angio Chest Pe W Or Wo Contrast  Result Date: 03/21/2018 CLINICAL DATA:  Altered mental status with elevated D-dimer levels. Evaluate for acute pulmonary embolism. Metastatic colon cancer, chemotherapy ongoing. Cough. EXAM: CT ANGIOGRAPHY CHEST WITH CONTRAST TECHNIQUE: Multidetector CT imaging of the chest was performed using the standard protocol during bolus administration of intravenous contrast. Multiplanar CT image reconstructions and MIPs were obtained to evaluate the vascular anatomy. CONTRAST:  174mL ISOVUE-370 IOPAMIDOL (ISOVUE-370) INJECTION 76%  COMPARISON:  Chest radiographs 03/20/2018. Abdominal ultrasound 03/20/2018. Limited correlation made with lumbar MRI 11/17/2016. FINDINGS: Cardiovascular: The pulmonary arteries are well opacified with contrast to the level of the subsegmental branches. There is no evidence of acute pulmonary embolism. Mild aortic atherosclerosis without acute vascular findings. Right IJ Port-A-Cath extends to the superior cavoatrial junction. The heart size is normal. There is no pericardial effusion. Mediastinum/Nodes: There are no enlarged mediastinal, hilar, axillary or internal mammary lymph nodes. The thyroid gland, trachea and esophagus demonstrate no significant findings. Lungs/Pleura: There are small right-greater-than-left dependent pleural effusions. Mild dependent atelectasis is present in both lungs. There is no confluent airspace opacity or suspicious pulmonary nodule. Upper abdomen: The liver is enlarged with numerous ill-defined low-density masses, most consistent with widespread metastatic disease. Representative lesion measures 5.1 cm in the right lobe on image 68/4. There is some upper abdominal ascites. Musculoskeletal/Chest wall: Peripheral left breast mass is incompletely visualized, measuring up to 4.1 cm on image 122/7. No other chest wall masses are seen. There are no suspicious osseous findings. Review of the MIP images confirms the above findings. IMPRESSION: 1. No evidence of acute pulmonary embolism. 2. Bilateral pleural effusions with bibasilar atelectasis. 3. Widespread hepatic metastatic disease. If this has not been previously documented elsewhere, consider dedicated abdominopelvic CT for further staging. 4. Indeterminate left breast mass. This apparently has been previously evaluated by mammography, ultrasound and biopsy in 2015. 5. No evidence of thoracic nodal, pulmonary or osseous metastases. Electronically Signed   By: Richardean Sale M.D.   On: 03/21/2018 02:36   US Abdomen Limited  Result  Date: 03/20/2018 CLINICAL DATA:  47 y/o  F; abnormal liver function. EXAM: ULTRASOUND ABDOMEN LIMITED RIGHT UPPER QUADRANT COMPARISON:  None. FINDINGS: Gallbladder: Cholelithiasis. No gallbladder wall thickening. No pericholecystic fluid. Negative sonographic Murphy's sign. Common bile duct: Diameter: 2.9 mm Liver: Diffuse heterogeneity of liver parenchyma and nodular contour. Small volume of perihepatic ascites. Portal vein is patent on color Doppler imaging with normal direction of blood flow towards the liver. IMPRESSION: 1. Cholelithiasis.  No secondary findings of acute cholecystitis. 2. Nodular liver contour and heterogeneous echotexture compatible with cirrhosis. 3. Small volume of perihepatic ascites. Electronically Signed   By: Kristine Garbe M.D.   On: 03/20/2018 21:00   US Paracentesis  Result Date:  03/21/2018 INDICATION: Metastatic colorectal cancer. Abdominal discomfort. Small volume ascites noted on prior imaging studies. Request for diagnostic paracentesis. EXAM: ULTRASOUND GUIDED LEFT UPPER QUADRANT PARACENTESIS MEDICATIONS: None. COMPLICATIONS: None immediate. PROCEDURE: Informed written consent was obtained from the patient after a discussion of the risks, benefits and alternatives to treatment. A timeout was performed prior to the initiation of the procedure. Initial ultrasound scanning demonstrates a small amount of ascites within the left upper abdominal quadrant. The left upper abdomen was prepped and draped in the usual sterile fashion. 1% lidocaine with epinephrine was used for local anesthesia. Following this, a 19 gauge, 7-cm, Yueh catheter was introduced. An ultrasound image was saved for documentation purposes. The paracentesis was performed. The catheter was removed and a dressing was applied. The patient tolerated the procedure well without immediate post procedural complication. FINDINGS: A total of approximately only 120 mL of clear, golden fluid was removed. Samples  were sent to the laboratory as requested by the clinical team. IMPRESSION: Successful ultrasound-guided paracentesis yielding 120 mL of peritoneal fluid. Read by: Ascencion Dike PA-C Electronically Signed   By: Jacqulynn Cadet M.D.   On: 03/21/2018 13:32   Dg Chest Portable 1 View  Result Date: 03/20/2018 CLINICAL DATA:  Cough. Altered mental status. History of stage IV colon carcinoma. EXAM: PORTABLE CHEST 1 VIEW COMPARISON:  07/28/2012 FINDINGS: Right lung base opacity partly silhouettes the right hemidiaphragm. Remainder of the lungs is clear. Cardiac silhouette is normal in size. No mediastinal or hilar masses. Right anterior chest wall power Port-A-Cath has its tip in the mid superior vena cava. Skeletal structures are intact. IMPRESSION: 1. Right lung base opacity consistent with pneumonia. Electronically Signed   By: Lajean Manes M.D.   On: 03/20/2018 17:41        Scheduled Meds: . citalopram  20 mg Oral Daily  . enoxaparin (LOVENOX) injection  40 mg Subcutaneous Q24H  . Ferrous Fumarate  1 tablet Oral BID AC  . furosemide  40 mg Intravenous Once  . hydrochlorothiazide  25 mg Oral Daily  . lactulose  10 g Oral Daily  . metoprolol tartrate  25 mg Oral BID  . potassium chloride SA  20 mEq Oral Q breakfast   Continuous Infusions: . ceFEPime (MAXIPIME) IV 2 g (03/22/18 0957)  . vancomycin 750 mg (03/22/18 5277)     LOS: 2 days     Cordelia Poche, MD [facility] 03/22/2018, 11:50 AM Pager: [phone]  If 7PM-7AM, please contact night-coverage [email or web address] 03/22/2018, 11:50 AM

## 2018-03-22 NOTE — Procedures (Signed)
History: 47 yo F being evaluated for altered mental status  Sedation: None  Technique: This is a 21 channel routine scalp EEG performed at the bedside with bipolar and monopolar montages arranged in accordance to the international 10/20 system of electrode placement. One channel was dedicated to EKG recording.    Background: There is a posterior dominant rhythm of 8 Hz. In addition, there are frequent, nearly continuous 1 - 1.5 Hz bifrontally predominant generalized discharges without evolution with a triphasic morphology. Towards the end of the recording,there is a brief run of bifrontally predominant sharply contoured beta occurring in bursts < 1 second long with relative attenuation lasting < one second seperating these bursts. There is no definite evolution and the discharges last 27 seconds.  Photic stimulation: Physiologic driving is not performed  EEG Abnormalities: 1) Brief rhythmic discharge concerning for possible seizure 2) Frequent triphasic waves 3) Generalized irregular slow activity.    Clinical Interpretation: This EEG is consistent with a moderate generalized cerebral dysfunction with superimposed severe cortical irritability in the bifrontal regions.   Fast activity associated with a 30 second run of discharges is suggestive of an ictal nature to this discharge, e.g. a brief seizure with a poorly lateralized frontal predominance.   Roland Rack, MD [facility] [phone]  If 7pm- 7am, please page neurology on call as listed in [place].

## 2018-03-22 NOTE — Progress Notes (Signed)
Nutrition Brief Note  Patient identified on the Malnutrition Screening Tool (MST) Report  Wt Readings from Last 15 Encounters:  03/20/18 196 lb (88.9 kg)  02/26/18 199 lb (90.3 kg)  06/11/17 192 lb (87.1 kg)  06/08/17 189 lb (85.7 kg)  02/22/17 201 lb 8 oz (91.4 kg)  02/10/17 203 lb (92.1 kg)  01/23/17 200 lb (90.7 kg)  01/16/17 199 lb 11.2 oz (90.6 kg)  12/19/16 200 lb 12.8 oz (91.1 kg)  11/28/16 198 lb 11.2 oz (90.1 kg)  11/21/16 199 lb 3.2 oz (90.4 kg)  11/07/16 193 lb (87.5 kg)  10/03/16 192 lb (87.1 kg)  05/06/16 198 lb (89.8 kg)  01/07/16 205 lb (93 kg)    Body mass index is 33.64 kg/m. Patient meets criteria for obesity based on current BMI. Skin WDL. Patient with hx of colon cancer with liver mets, currently noted to be a/o to self only and arrived to the ED d/t confusion/AMS.   Current diet order is 2 gram sodium. Medications reviewed; 1 tablet Hemocyte BID, 40 mg IV Lasix x1 dose today, 25 mg Hydrodiuril/day, 20 mEq oral KCl/day. Labs reviewed; Na: 126 mmol/L, K: 3.4 mmol/L, Cl: 95 mmol/L, Ca: 8 mg/dL.  No nutrition interventions warranted at this time. If nutrition issues arise, please consult RD.     Jarome Matin, MS, RD, LDN, [facility] Inpatient Clinical Dietitian Pager # [phone] After hours/weekend pager # [phone]

## 2018-03-22 NOTE — Evaluation (Signed)
Clinical/Bedside Swallow Evaluation Patient Details  Name: Brittney Young MRN: [id number] Date of Birth: 11/28/1970  Today's Date: 03/22/2018 Time: SLP Start Time (ACUTE ONLY): 28 SLP Stop Time (ACUTE ONLY): 1332 SLP Time Calculation (min) (ACUTE ONLY): 13 min  Past Medical History:  Past Medical History:  Diagnosis Date  . Anxiety   . Colon cancer (Brittney Young)   . Depression   . Hypertension    Past Surgical History:  Past Surgical History:  Procedure Laterality Date  . CESAREAN SECTION     HPI:  Pt is a 47 y.o.female who presented with confusion; w/u still pending. CXR on admission showed R lung base opacity consistent with PNA but CT Chest 7/14 shouwed no opacities. PMH includes metastatic adenocarcinoma of the colon with mets to the liver, followed by [facility]. Per MD note, oncology plans to no longer offer chemotherapy as pt had significantly declined. Plan was for hospice care at home.   Assessment / Plan / Recommendation Clinical Impression  Pt presents with what appears to be a cognitively-based oral dysphagia, characterized by decreased awareness and oral holding. SLP provided Mod cues to initiate posterior transit, with tactile input from a dry spoon being the most effective strategy. Of note, pt has been having seizure-like activity intermittently throughout the day with most recent episode just before SLP evaluation. Pt became increasingly more responsive to commands as evaluation progressed, yet still minimally communicative. Although that episode may have exacerbated pt's altered mentation at the moment, it also suggests that her mentation may wax/wane throughout the day. Therefore would recommend starting with Dys 1 diet and thin liquids for today. SLP will f/u for continued differential diagnosis of swallowing abilities with potential for diet advancement pending improved mentation. SLP Visit Diagnosis: Dysphagia, oral phase (R13.11)    Aspiration Risk  Moderate  aspiration risk;Risk for inadequate nutrition/hydration    Diet Recommendation Dysphagia 1 (Puree);Thin liquid   Liquid Administration via: Cup;Straw Medication Administration: Crushed with puree Supervision: Full supervision/cueing for compensatory strategies;Staff to assist with self feeding Compensations: Slow rate;Small sips/bites;Minimize environmental distractions Postural Changes: Seated upright at 90 degrees    Other  Recommendations Oral Care Recommendations: Oral care BID   Follow up Recommendations (tba)      Frequency and Duration min 2x/week  2 weeks       Prognosis Prognosis for Safe Diet Advancement: Fair Barriers to Reach Goals: Cognitive deficits;Other (Comment)(overall medical prognosis)      Swallow Study   General HPI: Pt is a 47 y.o.female who presented with confusion; w/u still pending. CXR on admission showed R lung base opacity consistent with PNA but CT Chest 7/14 shouwed no opacities. PMH includes metastatic adenocarcinoma of the colon with mets to the liver, followed by [facility]. Per MD note, oncology plans to no longer offer chemotherapy as pt had significantly declined. Plan was for hospice care at home. Type of Study: Bedside Swallow Evaluation Previous Swallow Assessment: none in chart Diet Prior to this Study: Regular;Thin liquids Temperature Spikes Noted: No Respiratory Status: Room air History of Recent Intubation: No Behavior/Cognition: Alert;Cooperative;Requires cueing Oral Cavity Assessment: Within Functional Limits Oral Care Completed by SLP: No Oral Cavity - Dentition: Adequate natural dentition;Missing dentition Self-Feeding Abilities: Total assist Patient Positioning: Upright in bed Baseline Vocal Quality: Normal Volitional Cough: Weak Volitional Swallow: Able to elicit    Oral/Motor/Sensory Function Overall Oral Motor/Sensory Function: Generalized oral weakness   Ice Chips Ice chips: Not tested   Thin Liquid Thin Liquid:  Impaired Presentation: Cup;Straw Oral  Phase Impairments: Poor awareness of bolus Oral Phase Functional Implications: Oral holding    Nectar Thick Nectar Thick Liquid: Not tested   Honey Thick Honey Thick Liquid: Not tested   Puree Puree: Impaired Presentation: Spoon Oral Phase Impairments: Poor awareness of bolus Oral Phase Functional Implications: Oral holding   Solid   GO   Solid: Not tested        Brittney Young 03/22/2018,1:48 PM  Brittney Young, M.A. CCC-SLP [phone]

## 2018-03-22 NOTE — Consult Note (Addendum)
NEURO HOSPITALIST CONSULT NOTE   Requestig physician: Dr. Teryl Lucy  Reason for Consult: Altered mental status  History obtained from: Chart and patient  HPI:                                                                                                                                          Brittney Young is an 47 y.o. female with history of hypertension, depression, colon cancer, anxiety.  Patient was seen by her oncologist due to patient sister bringing her to the office and asking her to be seen secondary to increased confusion.  She had a CT scan of her brain which did not show any metastasis.  At that time she had visible jaundice and icterus.  It was also noted that she had a difficult time remembering her sister's name and could not tell the date of the month or the president.  Her oncology history includes right lower quadrant adenopathy, multiple low-density hepatic lesions, left axillary and superior mediastinal lymph nodes, left breast mass and 2 right lung nodules.  Patient was admitted to [facility] on 03/20/2018, for continued confusion.  At that time it seemed to be secondary to hepatic encephalopathy.  She did have a Gram stain with her paracentesis with no organisms. Metabolic panel did show hyponatremia of 129, AST of 223 ALT of 251, alk phos of 588.  Patient's ammonia was elevated slightly at 47.  Urinalysis was not significant.  Talking to her daughter was at bedside she was fine last Thursday however around 1800 hrs. the patient noted that she was not really comprehending things well although family says she was.  On Friday it worsened to the point where she was not comprehending things very well and throughout the weekend got worse.  At this point in time patient has a blank stare and needs multiple attempts and visual cues to have her follow commands.  She is very minimally verbal but I am told that she was more verbal yesterday.  With cues she is  able to follow very simple commands.  At this point repeat TSH, B12, ammonia, basic metabolic panel have been reordered.  MRI brain showed no metastatic lesions or abnormal meningeal enhancement. A small focus of restricted diffusion within the left cerebellar hemisphere was read as possible acute infarction by radiology, but appears more consistent with T2-shine through artifact as it is associated with hypointensity on T1 and hyperintensity on T2, both chronic-appearing.   EEG revealed the following: 1) Brief rhythmic discharge concerning for possible seizure 2) Frequent triphasic waves 3) Generalized irregular slow activity.     Past Medical History:  Diagnosis Date  . Anxiety   . Colon cancer ([place])   . Depression   . Hypertension     Past  Surgical History:  Procedure Laterality Date  . CESAREAN SECTION      Family History  Problem Relation Age of Onset  . Stroke Mother   . Cancer Sister   . Migraines Sister             Social History:  reports that she has never smoked. She has never used smokeless tobacco. She reports that she does not drink alcohol or use drugs.  No Known Allergies  MEDICATIONS:                                                                                                                     Scheduled: . citalopram  20 mg Oral Daily  . enoxaparin (LOVENOX) injection  40 mg Subcutaneous Q24H  . Ferrous Fumarate  1 tablet Oral BID AC  . furosemide  40 mg Intravenous Once  . hydrochlorothiazide  25 mg Oral Daily  . lactulose  10 g Oral Daily  . metoprolol tartrate  25 mg Oral BID  . potassium chloride SA  20 mEq Oral Q breakfast     ROS:                                                                                                                                       History obtained from unobtainable from patient due to mental status    Blood pressure (!) 151/107, pulse (!) 111, temperature 99.2 F (37.3 C), resp. rate 16, weight 88.9 kg  (196 lb), SpO2 100 %.   General Examination:                                                                                                       Physical Exam  HEENT-  Normocephalic, no lesions, without obvious abnormality.  Normal external eye and conjunctiva.   Musculoskeletal-no joint tenderness, deformity or swelling Skin-warm and dry, no hyperpigmentation, vitiligo, or suspicious lesions  Neurological Examination Mental Status: Patient is awake and alert. She is not able to  answer orientation questions except for her name. Prominently increased latency of verbal and motor responses. No neglect. Able to follow very simple verbal commands such as raising her arms.  To get her to touch her nose and lift her legs I had to give her visual cues.  She did touch her nose back-and-forth with no asterixis or dysmetria.  I could not get her to do complex commands.  She appeared very encephalopathic on both NP and attending neurologist exams. She did not show any adventitious movements suggestive of clinical seizures, with no limb twitching, repetitive eye blinking, eye deviation or nystagmus.  Cranial Nerves: II: Blinks to threat bilaterally III,IV, VI: ptosis not present, extra-ocular motions intact bilaterally pupils equal, round, reactive to light and accommodation V,VII: Face symmetric, facial light touch sensation normal bilaterally VIII: hearing normal bilaterally IX,X: uvula rises symmetrically XII: midline tongue extension Motor: Moving all extremities antigravity with no asterixis but does show a slight tremor of her fingers when holding arms up and the arms tend to not only go up but raise and continue to go behind her head.  No increased tone noted no cogwheeling noted Sensory: Reacts to noxious stimuli all 4 extremities Deep Tendon Reflexes: 1+ knee jerk bilaterally. 0 ankle jerk bilaterally. She does have 1+-2+ reflexes in the upper extremities Plantars: Right: downgoing  Left:  downgoing Cerebellar: normal finger-to-nose with significant bradykinesia noted.   Gait: not tested   Lab Results: Basic Metabolic Panel: Recent Labs  Lab 03/20/18 1710 03/22/18 0447  NA 129* 126*  K 4.2 3.4*  CL 92* 95*  CO2 23 20*  GLUCOSE 118* 97  BUN 20 14  CREATININE 0.73 0.51  CALCIUM 8.8* 8.0*    CBC: Recent Labs  Lab 03/20/18 1710 03/22/18 0447  WBC 7.7 9.6  NEUTROABS 6.7  --   HGB 10.6* 8.6*  HCT 31.7* 25.6*  MCV 91.1 94.1  PLT 91* 59*    Cardiac Enzymes: Recent Labs  Lab 03/20/18 2202 03/21/18 0454 03/21/18 1001  TROPONINI 0.04* 0.04* 0.04*    Lipid Panel: No results for input(s): CHOL, TRIG, HDL, CHOLHDL, VLDL, LDLCALC in the last 168 hours.  Imaging: Dg Knee 1-2 Views Left  Result Date: 03/21/2018 CLINICAL DATA:  Left knee pain. EXAM: LEFT KNEE - 1-2 VIEW COMPARISON:  None. FINDINGS: No fracture.  No bone lesion. Knee joint is normally spaced and aligned. No arthropathic changes. No joint effusion. Soft tissues are unremarkable. IMPRESSION: Negative. Electronically Signed   By: Lajean Manes M.D.   On: 03/21/2018 18:16   Dg Ankle Complete Left  Result Date: 03/20/2018 CLINICAL DATA:  Ankle pain. Stage IV colon cancer. No trauma history submitted. EXAM: LEFT ANKLE COMPLETE - 3+ VIEW COMPARISON:  None. FINDINGS: Diffuse soft tissue swelling could be due to patient body habitus. No acute fracture or dislocation. Base of fifth metatarsal and talar dome intact. IMPRESSION: No acute osseous abnormality. Electronically Signed   By: Abigail Miyamoto M.D.   On: 03/20/2018 19:06   Dg Ankle Complete Right  Result Date: 03/20/2018 CLINICAL DATA:  Patient arrived via [facility] from home. Patient has had altered mental status and generalized weakness for the past day. Stage 4 colon cancer pt. Patient states to the EMS, "I am a ghost, I am a alien, I cant walk , I am white". PT c/o bilateral ankle pain. EXAM: RIGHT ANKLE - COMPLETE 3+ VIEW COMPARISON:  None. FINDINGS:  Diffuse soft tissue swelling could be habitus induced. No acute fracture or dislocation. Base  of fifth metatarsal and talar dome intact. No focal osseous lesion. IMPRESSION: No acute osseous abnormality. Electronically Signed   By: Abigail Miyamoto M.D.   On: 03/20/2018 19:07   Ct Head Wo Contrast  Result Date: 03/20/2018 CLINICAL DATA:  Altered consciousness.  Colon cancer. EXAM: CT HEAD WITHOUT CONTRAST TECHNIQUE: Contiguous axial images were obtained from the base of the skull through the vertex without intravenous contrast. COMPARISON:  10/12/2004 FINDINGS: Brain: Mild cerebral atrophy for age. Dural calcifications again identified. No mass lesion, hemorrhage, hydrocephalus, acute infarct, intra-axial, or extra-axial fluid collection. Vascular: No hyperdense vessel or unexpected calcification. Skull: Normal Sinuses/Orbits: Normal imaged portions of the orbits and globes. Clear paranasal sinuses and mastoid air cells. Other: None. IMPRESSION: Mild cerebral atrophy.  No acute intracranial abnormality. Electronically Signed   By: Abigail Miyamoto M.D.   On: 03/20/2018 17:40   Ct Angio Chest Pe W Or Wo Contrast  Result Date: 03/21/2018 CLINICAL DATA:  Altered mental status with elevated D-dimer levels. Evaluate for acute pulmonary embolism. Metastatic colon cancer, chemotherapy ongoing. Cough. EXAM: CT ANGIOGRAPHY CHEST WITH CONTRAST TECHNIQUE: Multidetector CT imaging of the chest was performed using the standard protocol during bolus administration of intravenous contrast. Multiplanar CT image reconstructions and MIPs were obtained to evaluate the vascular anatomy. CONTRAST:  147m ISOVUE-370 IOPAMIDOL (ISOVUE-370) INJECTION 76% COMPARISON:  Chest radiographs 03/20/2018. Abdominal ultrasound 03/20/2018. Limited correlation made with lumbar MRI 11/17/2016. FINDINGS: Cardiovascular: The pulmonary arteries are well opacified with contrast to the level of the subsegmental branches. There is no evidence of acute  pulmonary embolism. Mild aortic atherosclerosis without acute vascular findings. Right IJ Port-A-Cath extends to the superior cavoatrial junction. The heart size is normal. There is no pericardial effusion. Mediastinum/Nodes: There are no enlarged mediastinal, hilar, axillary or internal mammary lymph nodes. The thyroid gland, trachea and esophagus demonstrate no significant findings. Lungs/Pleura: There are small right-greater-than-left dependent pleural effusions. Mild dependent atelectasis is present in both lungs. There is no confluent airspace opacity or suspicious pulmonary nodule. Upper abdomen: The liver is enlarged with numerous ill-defined low-density masses, most consistent with widespread metastatic disease. Representative lesion measures 5.1 cm in the right lobe on image 68/4. There is some upper abdominal ascites. Musculoskeletal/Chest wall: Peripheral left breast mass is incompletely visualized, measuring up to 4.1 cm on image 122/7. No other chest wall masses are seen. There are no suspicious osseous findings. Review of the MIP images confirms the above findings. IMPRESSION: 1. No evidence of acute pulmonary embolism. 2. Bilateral pleural effusions with bibasilar atelectasis. 3. Widespread hepatic metastatic disease. If this has not been previously documented elsewhere, consider dedicated abdominopelvic CT for further staging. 4. Indeterminate left breast mass. This apparently has been previously evaluated by mammography, ultrasound and biopsy in 2015. 5. No evidence of thoracic nodal, pulmonary or osseous metastases. Electronically Signed   By: WRichardean SaleM.D.   On: 03/21/2018 02:36   UKoreaAbdomen Limited  Result Date: 03/20/2018 CLINICAL DATA:  47y/o  F; abnormal liver function. EXAM: ULTRASOUND ABDOMEN LIMITED RIGHT UPPER QUADRANT COMPARISON:  None. FINDINGS: Gallbladder: Cholelithiasis. No gallbladder wall thickening. No pericholecystic fluid. Negative sonographic Murphy's sign. Common  bile duct: Diameter: 2.9 mm Liver: Diffuse heterogeneity of liver parenchyma and nodular contour. Small volume of perihepatic ascites. Portal vein is patent on color Doppler imaging with normal direction of blood flow towards the liver. IMPRESSION: 1. Cholelithiasis.  No secondary findings of acute cholecystitis. 2. Nodular liver contour and heterogeneous echotexture compatible with cirrhosis. 3. Small volume of  perihepatic ascites. Electronically Signed   By: Kristine Garbe M.D.   On: 03/20/2018 21:00   US Paracentesis  Result Date: 03/21/2018 INDICATION: Metastatic colorectal cancer. Abdominal discomfort. Small volume ascites noted on prior imaging studies. Request for diagnostic paracentesis. EXAM: ULTRASOUND GUIDED LEFT UPPER QUADRANT PARACENTESIS MEDICATIONS: None. COMPLICATIONS: None immediate. PROCEDURE: Informed written consent was obtained from the patient after a discussion of the risks, benefits and alternatives to treatment. A timeout was performed prior to the initiation of the procedure. Initial ultrasound scanning demonstrates a small amount of ascites within the left upper abdominal quadrant. The left upper abdomen was prepped and draped in the usual sterile fashion. 1% lidocaine with epinephrine was used for local anesthesia. Following this, a 19 gauge, 7-cm, Yueh catheter was introduced. An ultrasound image was saved for documentation purposes. The paracentesis was performed. The catheter was removed and a dressing was applied. The patient tolerated the procedure well without immediate post procedural complication. FINDINGS: A total of approximately only 120 mL of clear, golden fluid was removed. Samples were sent to the laboratory as requested by the clinical team. IMPRESSION: Successful ultrasound-guided paracentesis yielding 120 mL of peritoneal fluid. Read by: Ascencion Dike PA-C Electronically Signed   By: Jacqulynn Cadet M.D.   On: 03/21/2018 13:32   Dg Chest Portable 1  View  Result Date: 03/20/2018 CLINICAL DATA:  Cough. Altered mental status. History of stage IV colon carcinoma. EXAM: PORTABLE CHEST 1 VIEW COMPARISON:  07/28/2012 FINDINGS: Right lung base opacity partly silhouettes the right hemidiaphragm. Remainder of the lungs is clear. Cardiac silhouette is normal in size. No mediastinal or hilar masses. Right anterior chest wall power Port-A-Cath has its tip in the mid superior vena cava. Skeletal structures are intact. IMPRESSION: 1. Right lung base opacity consistent with pneumonia. Electronically Signed   By: Lajean Manes M.D.   On: 03/20/2018 17:41    Assessment and plan per attending neurologist. Etta Quill PA-C [facility] [phone] 03/22/2018, 2:55 PM   Assessment: This is a 47 year old female with subacute confusion in the setting of an oncological history.  Over the last 4 days she has progressively become more confused.  I do believe that a significant aspect of this is the fact that she is hyponatremic along with her AST and ALTs being very elevated and her ammonia being mildly elevated at 45.  Given the hyponatremia I would very much like to rule out any form of seizure activity, although could also be contributing to a multifactorial metabolic encephalopathy.   1. MRI brain shows no metastatic lesions or abnormal meningeal enhancement. A small focus of restricted diffusion within the left cerebellar hemisphere was read as possible acute infarction by radiology, but appears more consistent with T2-shine through artifact as it is associated with hypointensity on T1 and hyperintensity on T2, both chronic-appearing.  2. EEG reveals brief rhythmic discharge concerning for possible seizure in addition to frequent triphasic waves. 3. Overall clinical picture is most consistent with a metabolic encephalopathy, although superimposed new onset seizure with postictal state may also be a contributing factor. Family states she is somewhat better  than yesterday cognitively, but still not at her baseline. Uncertain if the improvement is due to starting Keppra or just a cognitive fluctuation in the setting of probable metabolic encephalopathy.    Recommendations: - Continue to normalize her electrolytes. Reorder ammonia, TSH, CMP - LTM EEG this AM to assess for possible subclinical seizures -- Continue Keppra 500 mg BID  Electronically signed: Dr. Kerney Elbe

## 2018-03-22 NOTE — Progress Notes (Signed)
EEG complete - results pending 

## 2018-03-23 ENCOUNTER — Inpatient Hospital Stay (HOSPITAL_COMMUNITY): Payer: BLUE CROSS/BLUE SHIELD

## 2018-03-23 ENCOUNTER — Other Ambulatory Visit: Payer: Self-pay

## 2018-03-23 DIAGNOSIS — D696 Thrombocytopenia, unspecified: Secondary | ICD-10-CM

## 2018-03-23 DIAGNOSIS — R569 Unspecified convulsions: Secondary | ICD-10-CM

## 2018-03-23 DIAGNOSIS — R945 Abnormal results of liver function studies: Secondary | ICD-10-CM

## 2018-03-23 DIAGNOSIS — A419 Sepsis, unspecified organism: Secondary | ICD-10-CM

## 2018-03-23 DIAGNOSIS — G9341 Metabolic encephalopathy: Secondary | ICD-10-CM

## 2018-03-23 DIAGNOSIS — R6520 Severe sepsis without septic shock: Secondary | ICD-10-CM

## 2018-03-23 DIAGNOSIS — K7460 Unspecified cirrhosis of liver: Secondary | ICD-10-CM

## 2018-03-23 DIAGNOSIS — C799 Secondary malignant neoplasm of unspecified site: Secondary | ICD-10-CM

## 2018-03-23 LAB — URINE CULTURE: CULTURE: NO GROWTH

## 2018-03-23 LAB — MRSA PCR SCREENING: MRSA by PCR: NEGATIVE

## 2018-03-23 MED ORDER — METOPROLOL TARTRATE 50 MG PO TABS
50.0000 mg | ORAL_TABLET | Freq: Two times a day (BID) | ORAL | Status: DC
  Administered 2018-03-23 – 2018-03-28 (×11): 50 mg via ORAL
  Filled 2018-03-23 (×12): qty 1

## 2018-03-23 MED ORDER — PHENOL 1.4 % MT LIQD
1.0000 | OROMUCOSAL | Status: DC | PRN
  Filled 2018-03-23: qty 177

## 2018-03-23 MED ORDER — METOPROLOL TARTRATE 25 MG PO TABS
25.0000 mg | ORAL_TABLET | Freq: Once | ORAL | Status: AC
  Administered 2018-03-23: 25 mg via ORAL
  Filled 2018-03-23: qty 1

## 2018-03-23 MED ORDER — SODIUM CHLORIDE 0.9 % IV BOLUS
500.0000 mL | Freq: Once | INTRAVENOUS | Status: AC
  Administered 2018-03-23: 500 mL via INTRAVENOUS

## 2018-03-23 NOTE — Care Management Note (Addendum)
Case Management Note  Patient Details  Name: SHAKEYLA GIEBLER MRN: [id number] Date of Birth: 1971-03-20  Subjective/Objective:    From home with 45 year old son,  history of metastatic adenocarcinoma of the colon, with mets to the liver.  Patient presented with confusion. Palliative consulted, they will plan to meet with family.   7/18 Tomi Bamberger RN, BSN- patient will be going home with [facility] on 7/19 referral made to [facility] with [facility] and also gave her referral for [facility].  Velta Addison will be coming over to speak with patient and family today.                  Action/Plan: DC home with [facility]osp[facility] at discharge.   Expected Discharge Date:                  Expected Discharge Plan:     In-House Referral:     Discharge planning Services  CM Consult  Post Acute Care Choice:    Choice offered to:     DME Arranged:    DME Agency:     HH Arranged:    HH Agency:     Status of Service:  In process, will continue to follow  If discussed at Long Length of Stay Meetings, dates discussed:    Additional Comments:  Zenon Mayo, RN 03/23/2018, 9:57 AM

## 2018-03-23 NOTE — Progress Notes (Signed)
Pharmacy Antibiotic Note  Brittney Young is a 47 y.o. female admitted on 03/20/2018 with sepsis.  Pharmacy has been consulted for cefepime and vancomycin dosing.  Day #4 of abx for HCAP. Paracentesis on 7/14 with > 160mL of output. Afeb, WBC 9.6  Plan: Continue vancomycin 750mg  IV Q12h Continue cefepime 2g IV Q8h Monitor clinical picture, renal function, VT prn F/U C&S, abx deescalation / LOT  Stop vanc today with MRSA PCR negative?  Height: 5\' 4"  (162.6 cm) Weight: 197 lb 5 oz (89.5 kg) IBW/kg (Calculated) : 54.7  Temp (24hrs), Avg:98.5 F (36.9 C), Min:97.5 F (36.4 C), Max:99.6 F (37.6 C)  Recent Labs  Lab 03/20/18 1710 03/20/18 1816 03/21/18 0103 03/21/18 0454 03/22/18 0447 03/22/18 1634  WBC 7.7  --   --   --  9.6  --   CREATININE 0.73  --   --   --  0.51 0.50  LATICACIDVEN  --  3.39* 1.8 1.9  --   --     Estimated Creatinine Clearance: 96.2 mL/min (by C-G formula based on SCr of 0.5 mg/dL).    No Known Allergies  Antimicrobials this admission: 7/13 vancomycin >>  7/13 cefepime >>  7/13 zithromax >> 7/15  Dose adjustments this admission:  Microbiology results: 7/14 BCx: ngtd 7/14 Strep pneumoniae antigen: negative 7/14 legionella antigen: IP  7/14 Resp PCR: neg 7/14 HIV antibody: neg 7/14 UCx: sent 7/14 Peritoneal fluid: ngtd  Brittney Young 03/23/2018 9:30 AM

## 2018-03-23 NOTE — Progress Notes (Signed)
  Speech Language Pathology Treatment: Dysphagia  Patient Details Name: Brittney Young MRN: [id number] DOB: 1971/07/03 Today's Date: 03/23/2018 Time: 4388-8757 SLP Time Calculation (min) (ACUTE ONLY): 16 min  Assessment / Plan / Recommendation Clinical Impression  Pt is alert and more responsive today than on previous date, although she does need Mod cues for sustained attention in order to respond to yes/no questions or follow commands. She does not respond to open-ended questions. Oral holding is still noted with fluctuating response to PO trials, consistent with a cognitively-based dysphagia. Max faded to Mod cues were provided to initiate oral transit. No overt signs of aspiration are observed. Pt will need full supervision for safety but also to initiate PO intake. Will continue to follow pending additional conversations regarding [place]ie.   HPI HPI: Pt is a 47 y.o.female who presented with confusion; w/u still pending. CXR on admission showed R lung base opacity consistent with PNA but CT Chest 7/14 shouwed no opacities. PMH includes metastatic adenocarcinoma of the colon with mets to the liver, followed by [facility]. Per MD note, oncology plans to no longer offer chemotherapy as pt had significantly declined. Plan was for hospice care at home.      SLP Plan  Continue with current plan of care       Recommendations  Diet recommendations: Dysphagia 1 (puree);Thin liquid Liquids provided via: Straw Medication Administration: Crushed with puree Supervision: Staff to assist with self feeding;Full supervision/cueing for compensatory strategies Compensations: Slow rate;Small sips/bites;Minimize environmental distractions Postural Changes and/or Swallow Maneuvers: Seated upright 90 degrees                Oral Care Recommendations: Oral care BID Follow up Recommendations: (tba) SLP Visit Diagnosis: Dysphagia, oral phase (R13.11) Plan: Continue with current plan of  care       GO                Brittney Young 03/23/2018, 4:04 PM  Brittney Young, M.A. CCC-SLP [phone]

## 2018-03-23 NOTE — Progress Notes (Signed)
Pt sister came out of pt room and stated that pt told her she was having chest pain at 1905. RN immediately obtained EKG which showed sinus tach with HR in the 120s. RN notified night shift MD and reported information to oncoming RN.

## 2018-03-23 NOTE — Progress Notes (Signed)
Neurology Progress Note   S:// Seen and examined. No new complaints  O:// Current vital signs: BP 123/89   Pulse (!) 124   Temp 98.5 F (36.9 C) (Oral)   Resp (!) 30   Ht 5\' 4"  (1.626 m)   Wt 89.5 kg (197 lb 5 oz)   LMP  (LMP Unknown)   SpO2 97%   BMI 33.87 kg/m  Vital signs in last 24 hours: Temp:  [97.5 F (36.4 C)-99.6 F (37.6 C)] 98.5 F (36.9 C) (07/16 0811) Pulse Rate:  [100-124] 124 (07/16 0811) Resp:  [16-30] 30 (07/16 0811) BP: (123-151)/(89-107) 123/89 (07/16 0811) SpO2:  [82 %-100 %] 97 % (07/16 0811) Weight:  [89.5 kg (197 lb 5 oz)] 89.5 kg (197 lb 5 oz) (07/15 2044) GENERAL: Awake, alert in NAD HEENT: - Normocephalic and atraumatic, dry mm, no LN++, no Thyromegally, scleral icterus present LUNGS - Clear to auscultation bilaterally with no wheezes CV - S1S2 RRR, no m/r/g, equal pulses bilaterally. ABDOMEN - Soft, nontender, nondistended with normoactive BS Ext: warm, well perfused, intact peripheral pulses, no edema NEURO:  Mental Status: AA&Ox2-but poor attention concentration Language: speech is clear.  Naming, repetition, fluency, and comprehension intact-he will be slow to respond Cranial Nerves: PERRL . EOMI, visual fields full, no facial asymmetry, facial sensation intact, hearing intact, tongue/uvula/soft palate midline, normal sternocleidomastoid and trapezius muscle strength. No evidence of tongue atrophy or fibrillations Motor: Is able to raise both upper extremities antigravity.  Is able to wiggle both toes to command.  Exhibited no asterixis Tone: is normal and bulk is normal Sensation- Intact to light touch bilaterally Coordination: FTN intact bilaterally  Medications  Current Facility-Administered Medications:  .  ceFEPIme (MAXIPIME) 2 g in sodium chloride 0.9 % 100 mL IVPB, 2 g, Intravenous, Q8H, Nettey, Ralph A, MD, Last Rate: 200 mL/hr at 03/23/18 0300, 2 g at 03/23/18 0300 .  citalopram (CELEXA) tablet 20 mg, 20 mg, Oral, Daily, Mariel Aloe, MD, 20 mg at 03/22/18 1015 .  enoxaparin (LOVENOX) injection 40 mg, 40 mg, Subcutaneous, Q24H, Mariel Aloe, MD, 40 mg at 03/23/18 0001 .  Ferrous Fumarate (HEMOCYTE - 106 mg FE) tablet 106 mg of iron, 1 tablet, Oral, BID AC, Mariel Aloe, MD, 106 mg of iron at 03/22/18 0830 .  hydrochlorothiazide (HYDRODIURIL) tablet 25 mg, 25 mg, Oral, Daily, Mariel Aloe, MD, 25 mg at 03/21/18 1351 .  HYDROcodone-acetaminophen (NORCO/VICODIN) 5-325 MG per tablet 1-2 tablet, 1-2 tablet, Oral, Q6H PRN, Mariel Aloe, MD .  lactulose (CHRONULAC) 10 GM/15ML solution 10 g, 10 g, Oral, Daily, Mariel Aloe, MD, 10 g at 03/21/18 1351 .  [COMPLETED] levETIRAcetam (KEPPRA) IVPB 1000 mg/100 mL premix, 1,000 mg, Intravenous, Once, Stopped at 03/22/18 1905 **FOLLOWED BY** levETIRAcetam (KEPPRA) IVPB 500 mg/100 mL premix, 500 mg, Intravenous, BID, Nettey, Ralph A, MD .  LORazepam (ATIVAN) injection 1-2 mg, 1-2 mg, Intravenous, Q2H PRN, Schorr, Rhetta Mura, NP .  metoprolol tartrate (LOPRESSOR) tablet 25 mg, 25 mg, Oral, BID, Mariel Aloe, MD, 25 mg at 03/23/18 0001 .  potassium chloride SA (K-DUR,KLOR-CON) CR tablet 20 mEq, 20 mEq, Oral, Q breakfast, Mariel Aloe, MD, 20 mEq at 03/22/18 1015 .  vancomycin (VANCOCIN) IVPB 750 mg/150 ml premix, 750 mg, Intravenous, Q12H, Mariel Aloe, MD, Last Rate: 150 mL/hr at 03/23/18 0646, 750 mg at 03/23/18 0646  Labs CBC    Component Value Date/Time   WBC 9.6 03/22/2018 0447   RBC 2.72 (L)  03/22/2018 0447   HGB 8.6 (L) 03/22/2018 0447   HCT 25.6 (L) 03/22/2018 0447   PLT 59 (L) 03/22/2018 0447   MCV 94.1 03/22/2018 0447   MCH 31.6 03/22/2018 0447   MCHC 33.6 03/22/2018 0447   RDW 21.9 (H) 03/22/2018 0447   LYMPHSABS 0.9 03/20/2018 1710   MONOABS 0.2 03/20/2018 1710   EOSABS 0.0 03/20/2018 1710   BASOSABS 0.0 03/20/2018 1710    CMP     Component Value Date/Time   NA 129 (L) 03/22/2018 1634   NA 137 10/06/2014   K 3.6 03/22/2018 1634   CL  93 (L) 03/22/2018 1634   CO2 19 (L) 03/22/2018 1634   GLUCOSE 110 (H) 03/22/2018 1634   BUN 15 03/22/2018 1634   BUN 17 10/06/2014   CREATININE 0.50 03/22/2018 1634   CREATININE 1.01 11/07/2016 1216   CALCIUM 8.7 (L) 03/22/2018 1634   CALCIUM 9.3 10/06/2014   PROT 7.5 03/22/2018 1634   ALBUMIN 2.8 (L) 03/22/2018 1634   AST 140 (H) 03/22/2018 1634   ALT 160 (H) 03/22/2018 1634   ALKPHOS 513 (H) 03/22/2018 1634   BILITOT 3.9 (H) 03/22/2018 1634   GFRNONAA >60 03/22/2018 1634   GFRNONAA >89 11/19/2015 1535   GFRAA >60 03/22/2018 1634   GFRAA >89 11/19/2015 1535  Ammonia has normalized.  Imaging I have reviewed images in epic and the results pertinent to this consultation are: MRI of the brain shows no metastatic lesion or abnormal enhancement.  Small focus of restricted diffusion read as possible acute infarct in the left cerebellum, is more likely T2 shine through an incidental.  EEG showed brief rhythmic discharges concerning for possible seizure in addition to triphasics that are seen in encephalopathy.  Assessment:  47 year old woman with ongoing confusion in the setting of metastatic colonic cancer, became more confused over the past 4 days. She has multiple metabolic derangements including hyponatremia and deranged liver function test. MRI of the brain is unremarkable with the exception of an incidental left cerebellar lesion which is likely T2 shine through and not an acute stroke. EEG though was concerning for possible seizure in addition to metabolic encephalopathy warranting further investigation. Today, her exam is much improved compared to prior documented exams. Started on [place] yesterday and it is unclear whether that might of contributed to improved mental status versus improvement of metabolic encephalopathy.  Impression: Toxic metabolic encephalopathy Evaluate for seizures Metastatic colonic cancer  Recommendations: LTM EEG this morning.  Continue for 24 hours.   If no seizures are noted within 24 hours, can discontinue. Continue with Keppra 500 twice daily Maintain seizure precautions Correct toxic metabolic derangements per primary team. Neurology will follow with you.  -- Amie Portland, MD [facility] Pager: [phone] If 7pm to 7am, please call on call as listed on AMION.

## 2018-03-23 NOTE — Progress Notes (Signed)
LTM runnning - no initial skin breakdown.

## 2018-03-23 NOTE — Consult Note (Addendum)
Consultation Note Date: 03/23/2018   Patient Name: Brittney Young  DOB: Mar 02, 1971  MRN: [id number]  Age / Sex: 47 y.o., female  PCP: Donella Stade, PA-C Referring Physician: Samuella Cota, MD  Reason for Consultation: Establishing goals of care, Hospice Evaluation and Psychosocial/spiritual support  HPI/Patient Profile: 47 y.o. female  with past medical history of metastatic colon cancer (diagnosed 04/2017) with mets to the liver and lung, left breast mass, HTN, depression and anxiety who was admitted on 03/20/2018 with encephalopathy x 3 days.  She had also been having bilateral LE weakness with pain in her right knee.  Work up revealed right lung opacity c/w PNA,  slightly elevated ammonia, small left cerebellar stroke and likely seizure activity.  Peritoneal fluid is significant for elevated lactate dehydrogenase (3055).  Imaging thus far is negative for mets to the brain.    Clinical Assessment and Goals of Care: The patient is unable to speak with me.  I called her sister Johney Frame.  [name] explained that the patient lives at home independently with her 70 year old son.  Per [name], if Kurt is unable to make decisions then the family as a group will make decisions for her.  [name] noticed last Thursday 7/11 that Michole was confused, and by Saturday 7/13 Coretta did not recognize family and was unable to stand.  I talked with [name] about the Oncologist's recommendation for Hospice services in the home.  [name] is searching for 24 hour care at home for Anneta.  She did not seem to have a full understanding of Hospice Services or the implications of the Oncologist offering Hospice.  I suggested that we gather the family together to review Laniece's current situation and discussed the next best steps going forward.  [name] is agreeable.  She will determine a time that the family is available to  meet and we will touch base later today.  Primary Decision Maker:  NEXT OF KIN [name] is the Primary Care taker.   The patient's son is too young.  I will determine legally who the primary decision maker is during our family meeting.    SUMMARY OF RECOMMENDATIONS    PMT will follow with you and attempt to schedule an extended family meeting in the next day or two. Patient is eligible for Hospice services in the home (advanced metastatic cancer with no further chemotherapy offered).  Pending the outcome of this hospitalization she may be eligible for 24 hour care at a Hospice facility.  Per Oncology note 03/19/2018 - Oncologist encourage family to visit as she did not expect the patient to survive another week.  Code Status/Advance Care Planning:  Full code.  To be discussed at the family meeting.   Symptom Management:   Per primary team.  Keppra started 7/15.  Additional Recommendations (Limitations, Scope, Preferences):  Full Scope Treatment  Palliative Prophylaxis:   Aspiration and Delirium Protocol  Prognosis:  Likely less than 2 weeks if patient is not taking more than sips and bites.   Discharge  Planning: To Be Determined      Primary Diagnoses: Present on Admission: . Pneumonia . Sepsis ([place])   I have reviewed the medical record, interviewed the patient and family, and examined the patient. The following aspects are pertinent.  Past Medical History:  Diagnosis Date  . Anxiety   . Colon cancer ([place])   . Depression   . Hypertension    Social History   Socioeconomic History  . Marital status: Single    Spouse name: Not on file  . Number of children: Not on file  . Years of education: Not on file  . Highest education level: Not on file  Occupational History  . Not on file  Social Needs  . Financial resource strain: Not on file  . Food insecurity:    Worry: Not on file    Inability: Not on file  . Transportation needs:    Medical: Not on file     Non-medical: Not on file  Tobacco Use  . Smoking status: Never Smoker  . Smokeless tobacco: Never Used  Substance and Sexual Activity  . Alcohol use: No  . Drug use: No  . Sexual activity: Not on file  Lifestyle  . Physical activity:    Days per week: Not on file    Minutes per session: Not on file  . Stress: Not on file  Relationships  . Social connections:    Talks on phone: Not on file    Gets together: Not on file    Attends religious service: Not on file    Active member of club or organization: Not on file    Attends meetings of clubs or organizations: Not on file    Relationship status: Not on file  Other Topics Concern  . Not on file  Social History Narrative  . Not on file   Family History  Problem Relation Age of Onset  . Stroke Mother   . Cancer Sister   . Migraines Sister    Scheduled Meds: . citalopram  20 mg Oral Daily  . enoxaparin (LOVENOX) injection  40 mg Subcutaneous Q24H  . Ferrous Fumarate  1 tablet Oral BID AC  . hydrochlorothiazide  25 mg Oral Daily  . lactulose  10 g Oral Daily  . metoprolol tartrate  25 mg Oral BID  . potassium chloride SA  20 mEq Oral Q breakfast   Continuous Infusions: . ceFEPime (MAXIPIME) IV 2 g (03/23/18 0300)  . levETIRAcetam    . vancomycin 750 mg (03/23/18 0646)   PRN Meds:.HYDROcodone-acetaminophen, LORazepam No Known Allergies Review of Systems patient is unable to provide  Physical Exam Well developed obese female.  Pleasantly confused, nods yes to some questions CV tachycardic Resp no distress Abdomen soft, nt, nd Ext lower ext with 2+ pitting edema.  Vital Signs: BP 123/89   Pulse (!) 124   Temp 98.5 F (36.9 C) (Oral)   Resp (!) 30   Ht 5\' 4"  (1.626 m)   Wt 89.5 kg (197 lb 5 oz)   LMP  (LMP Unknown)   SpO2 97%   BMI 33.87 kg/m  Pain Scale: 0-10   Pain Score: 0-No pain   SpO2: SpO2: 97 % O2 Device:SpO2: 97 % O2 Flow Rate: .   IO: Intake/output summary:   Intake/Output Summary (Last 24  hours) at 03/23/2018 0824 Last data filed at 03/23/2018 0700 Gross per 24 hour  Intake 2230 ml  Output 250 ml  Net 1980 ml    LBM: Last  BM Date: 03/21/18 Baseline Weight: Weight: 88.9 kg (196 lb) Most recent weight: Weight: 89.5 kg (197 lb 5 oz)     Palliative Assessment/Data:  20%     Time In: 8:00 Time Out: 8:50 Time Total: 50 min. Greater than 50%  of this time was spent counseling and coordinating care related to the above assessment and plan.  Signed by: Florentina Jenny, PA-C Palliative Medicine Pager: [phone]  Please contact Palliative Medicine Team phone at [phone] for questions and concerns.  For individual provider: See Shea Evans

## 2018-03-23 NOTE — Progress Notes (Signed)
PROGRESS NOTE  Brittney Young QBH:[id number] DOB: 1971-01-10 DOA: 03/20/2018 PCP: Donella Stade, PA-C Oncologist: Verdell Carmine in [place]  Brief Narrative: 47 year old woman PMH metastatic cecal adenocarcinoma, status post chemotherapy with disease progression, presented with acute encephalopathy.  Was admitted for encephalopathy, pneumonia, sepsis, hyponatremia. Treated with empiric antibiotics, underwent diagnostic paracentesis.  Developed seizure-like activity, neurology was consulted and the EEG obtained.  Neurology recommended transfer to [facility] for long-term video monitoring EEG.  Of note patient was seen by her oncologist 7/12 for increasing confusion which was thought to be secondary to liver failure from metastatic disease, at that time she had visible jaundice, icterus.  Oncologist told family of need to enroll patient in hospice.  Per oncologist "I believe that she will likely not survive the next week."  Assessment/Plan 47 year old woman with toxic metabolic encephalopathy likely secondary to metastatic adenocarcinoma of the colon.  Acute metabolic encephalopathy, thought to be initially hepatic in nature, however serum ammonia was only minimally elevated.  MRI brain no metastatic lesions or abnormal meningeal enhancement.  There was concern for left cerebellar infarct but per neurology appears more consistent with artifact.  Infectious work-up negative. --At this point suspect this is secondary to advanced liver disease secondary to metastatic adenocarcinoma of the colon.   -- Continue EEG for 24 hours per neurology.  Continue Keppra at current dosing.  Continue seizure precautions.  Metastatic adenocarcinoma of the colon: With metastatic disease to the liver, associated LFT elevation, last received chemotherapy July 8, per last oncology note patient no longer candidate for chemotherapy. --Supportive care.  Sepsis considered on admission, however pneumonia  considered on admission, however no hypoxia, CT chest no PE, bilateral pleural effusions, atelectasis, no infiltrate, widespread metastatic disease.  Blood cultures no growth today.  Respiratory PCR negative, strep pneumo antigen negative.  Urinalysis negative.  Urine culture no growth, final.  Peritoneal fluid no growth today, Gram stain was negative. --Afebrile, hemodynamic stable.  I see no evidence of sepsis or infectious process at this time.  Discontinue antibiotics and monitor.  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  Hyponatremia --No labs today but sodium was improving yesterday. --IV fluids.  Check BMP in a.m.  Elevated troponin --Trivial elevation, likely secondary to acute illness.  No further recommendations at this time.  Thrombocytopenia, likely related to chemotherapy and cirrhosis. --Worse yesterday.  Check CBC in a.m.  Anemia of chronic disease, likely exacerbated by chemotherapy. --Worse yesterday.  Check CBC in a.m.  Dysphasia 1, thin liquid medication crushed with pure.  Indeterminate left breast mass.   Patient appears critically ill and may not survive this hospitalization.  Appreciate palliative medicine involvement.  Hospice appears appropriate based both on clinical status as well as oncologist judgment last office visit.  DVT prophylaxis: enoxaparin Code Status: Full Family Communication: none Disposition Plan: pending    [name], MD  [facility] Direct contact: [phone] --Via amion app OR  --[email or web address]; password [email or web address]  7PM-7AM contact night coverage as above 03/23/2018, 1:23 PM  LOS: 3 days   Consultants:  Neurology   IR  Procedures:  7/14 US guided paracentesis from LUQ. Yielded 120 mL of clear golden fluid.   EEG Clinical Interpretation: This EEG is consistent with a moderate generalized cerebral dysfunction with  superimposed severe cortical irritability in the bifrontal regions.    Continuous EEG   Antimicrobials:  Cefepime 7/13 >  Vancomycin 7/13 >  Interval history/Subjective: Hx limited by condition, Follows  some simple commands. Speech intermittent.  Objective: Vitals:  Vitals:   03/23/18 0421 03/23/18 0811  BP: (!) 132/92 123/89  Pulse: 100 (!) 124  Resp:  (!) 30  Temp: 98 F (36.7 C) 98.5 F (36.9 C)  SpO2: 98% 97%    Exam:  Constitutional:  . Appears calm and comfortable Eyes:  . pupils appear normal . Normal lids  ENMT:  . grossly normal hearing  . Lips appear normal Respiratory:  . CTA bilaterally, no w/r/r  . Respiratory effort normal Cardiovascular:  . RRR, no m/r/g . No LE extremity edema   Abdomen:  . soft Musculoskeletal:  . RUE, LUE, RLE, LLE   . Moves all extremities to command Psychiatric:  . Mental status Encephalopathic, follows some simple commands  I have personally reviewed the following:   Labs:  No labs today.  Labs from 7/15: Sodium 129, CO2 19, BUN and creatinine within normal limits.  Alkaline phosphatase 513, AST 140, ALP 160, total bilirubin 3.9, TSH within normal limits, hemoglobin 8.6, platelets 59  Imaging studies:  MRI brain 7/15 small acute infarct left lateral cerebellum.  No metastatic disease noted.  Review and summation of old records:  As above  Scheduled Meds: . citalopram  20 mg Oral Daily  . enoxaparin (LOVENOX) injection  40 mg Subcutaneous Q24H  . Ferrous Fumarate  1 tablet Oral BID AC  . hydrochlorothiazide  25 mg Oral Daily  . lactulose  10 g Oral Daily  . metoprolol tartrate  25 mg Oral BID  . potassium chloride SA  20 mEq Oral Q breakfast   Continuous Infusions: . ceFEPime (MAXIPIME) IV 2 g (03/23/18 1047)  . levETIRAcetam 500 mg (03/23/18 0932)  . vancomycin 750 mg (03/23/18 1443)    Active Problems:   Metastatic colon cancer in female [facility])   Abnormal liver function   Seizures ([place])    Acute metabolic encephalopathy   Cirrhosis ([place])   Thrombocytopenia ([place])   LOS: 3 days    Time coordination of care, discussion with palliative medicine, care of patient 45 minutes.

## 2018-03-23 NOTE — Progress Notes (Signed)
Visited with patient. Had visit-patient not very verbal after seizure.  Pastoral presence and support.  Had prayer with patient for peace of mind and heart and for staff caring for her. Prayers for her family during this time of changes. Conard Novak, Chaplain   03/23/18 1000  Clinical Encounter Type  Visited With Patient  Visit Type Initial;Spiritual support  Referral From Physician  Consult/Referral To Chaplain  Spiritual Encounters  Spiritual Needs Prayer;Emotional  Stress Factors  Patient Stress Factors Health changes  Family Stress Factors Health changes

## 2018-03-24 LAB — COMPREHENSIVE METABOLIC PANEL
ALT: 134 U/L — AB (ref 0–44)
AST: 113 U/L — ABNORMAL HIGH (ref 15–41)
Albumin: 2.3 g/dL — ABNORMAL LOW (ref 3.5–5.0)
Alkaline Phosphatase: 533 U/L — ABNORMAL HIGH (ref 38–126)
Anion gap: 12 (ref 5–15)
BUN: 11 mg/dL (ref 6–20)
CO2: 20 mmol/L — AB (ref 22–32)
Calcium: 8.6 mg/dL — ABNORMAL LOW (ref 8.9–10.3)
Chloride: 97 mmol/L — ABNORMAL LOW (ref 98–111)
Creatinine, Ser: 0.54 mg/dL (ref 0.44–1.00)
Glucose, Bld: 126 mg/dL — ABNORMAL HIGH (ref 70–99)
POTASSIUM: 3.4 mmol/L — AB (ref 3.5–5.1)
SODIUM: 129 mmol/L — AB (ref 135–145)
Total Bilirubin: 2.7 mg/dL — ABNORMAL HIGH (ref 0.3–1.2)
Total Protein: 6.6 g/dL (ref 6.5–8.1)

## 2018-03-24 LAB — CBC
HCT: 27.6 % — ABNORMAL LOW (ref 36.0–46.0)
Hemoglobin: 8.9 g/dL — ABNORMAL LOW (ref 12.0–15.0)
MCH: 30.5 pg (ref 26.0–34.0)
MCHC: 32.2 g/dL (ref 30.0–36.0)
MCV: 94.5 fL (ref 78.0–100.0)
Platelets: 59 10*3/uL — ABNORMAL LOW (ref 150–400)
RBC: 2.92 MIL/uL — AB (ref 3.87–5.11)
RDW: 22.6 % — AB (ref 11.5–15.5)
WBC: 9.1 10*3/uL (ref 4.0–10.5)

## 2018-03-24 MED ORDER — POTASSIUM CHLORIDE 20 MEQ/15ML (10%) PO SOLN
20.0000 meq | Freq: Every day | ORAL | Status: DC
  Administered 2018-03-25: 20 meq via ORAL
  Filled 2018-03-24: qty 15

## 2018-03-24 MED ORDER — POTASSIUM CHLORIDE 20 MEQ/15ML (10%) PO SOLN
40.0000 meq | ORAL | Status: AC
  Administered 2018-03-24: 40 meq via ORAL
  Filled 2018-03-24: qty 30

## 2018-03-24 MED ORDER — ONDANSETRON HCL 4 MG/2ML IJ SOLN
4.0000 mg | Freq: Four times a day (QID) | INTRAMUSCULAR | Status: DC | PRN
  Administered 2018-03-24 (×2): 4 mg via INTRAVENOUS
  Filled 2018-03-24 (×2): qty 2

## 2018-03-24 MED ORDER — POTASSIUM CHLORIDE CRYS ER 20 MEQ PO TBCR
40.0000 meq | EXTENDED_RELEASE_TABLET | Freq: Once | ORAL | Status: DC
  Filled 2018-03-24: qty 2

## 2018-03-24 NOTE — Progress Notes (Addendum)
Daily Progress Note   Patient Name: Brittney Young       Date: 03/24/2018 DOB: 06/13/1971  Age: 47 y.o. MRN#: [id number] Attending Physician: Shelly Coss, MD Primary Care Physician: Lavada Mesi Admit Date: 03/20/2018  Reason for Consultation/Follow-up: Establishing goals of care  Subjective: Patient able to answer simple questions today.  Sitting on the side of the bed.  "I'm getting my hair combed out".  I asked her why she was in the hospital "I don't know, I guess I blacked out".  She has no complaints of pain or discomfort.  She states she is not hungry.  Called sister [name].  We plan to meet in person with the extended family tomorrow morning at 9:00.  [name] requested a [facility] consultation for second opinion.  She has been very confused by the messages she has received from the patient's current oncologist.  I explained to [facility] that if her sister's labs continue to improve and she continues to clinically improve then an Oncology consultation may be a good idea.     We discussed the patient's severe adverse reactions to each type of chemotherapy she has tried (Folfox, folfiri, lonsurf) and the fact that she has continued to have disease progression despite chemo.  I suggested that chemotherapy is likely not in Alianis's best interest - - She may live longer and better at home with hospice rather than on chemo.  I also mentioned "[facility]" for Donette's 1 year old son - for [name]'s consideration.  The child's father is involved in his care whenever Evalise is incapacitated.   Assessment: Patient improving.  It appears encephalopathy may have been due to seizures and is now lifting.  I'm concerned that any further chemotherapy would be detrimental    Per Dr. Verdell Carmine, [facility],  note from 02/18/2018 visit:    Patient was initially started on Folfox.  12 sessions were planned.  After 5 sessions the patient had severe n/v and neutropenia.  CT scan at that time indicated disease progression.  She was then started on Folfiri on 08/2017 in February 2019 CT scans indicated disease progression.    She was seen at the [facility] and advised that they had no treatment options for her.     On 01/18/18 she was  started on Lonsurf therapy.  After 1 treament she was hospitalized with severe N/V and neutropenia requiring Neulasta therapy.    During her 02/18/2018 office visit Hospice was recommended but the patient declined Hospice and requested additional chemotherapy.    On 03/15/2018 she was prescribed  5-FU/leucovorin, and Avastin.    And on 03/20/2018 she was seen in the [place] office for confusion.   "She has 600 nucleated RBCs and 50 immature granulocytes and 40 nucleated RBCs is all consistent with a stressed bone marrow from metastatic cancer. Her bilirubin is elevated as is her alk phos ALT and AST. Her calcium is down, her CO2 is down and her sodium and chloride are quite low. She continues on her Decadron and may have some adrenal insufficiency with her low sodium and chloride in her high normal potassium but I believe that this is not the case and she is on Decadron routinely and that this is a consequence of mild dehydration. I have told the family that I do not think we will be giving any more chemotherapy and that everyone should come and visit her as often as they can as I do not anticipate her surviving another week."  Patient Profile/HPI:  47 y.o. female  with past medical history of metastatic colon cancer (diagnosed 04/2017) with mets to the liver and lung, left breast mass, HTN, depression and anxiety who was admitted on 03/20/2018 with encephalopathy x 3 days.  She had also been having bilateral LE weakness  with pain in her right knee.  Work up revealed right lung opacity c/w PNA,  slightly elevated ammonia, small left cerebellar stroke and likely seizure activity.  Peritoneal fluid is significant for elevated lactate dehydrogenase (3055).  Imaging thus far is negative for mets to the brain.     Length of Stay: 4  Current Medications: Scheduled Meds:  . citalopram  20 mg Oral Daily  . Ferrous Fumarate  1 tablet Oral BID AC  . lactulose  10 g Oral Daily  . metoprolol tartrate  50 mg Oral BID  . [START ON 03/25/2018] potassium chloride  20 mEq Oral Q breakfast    Continuous Infusions: . levETIRAcetam 500 mg (03/24/18 0811)    PRN Meds: HYDROcodone-acetaminophen, LORazepam, phenol  Physical Exam       Well developed female, pleasant, mildly confused, sitting on the side of the bed.  No apparent distress.  Cooperative.   Able to follow commands.  Vital Signs: BP (!) 117/102   Pulse (!) 108   Temp 98.7 F (37.1 C) (Oral)   Resp (!) 28   Ht 5' 4"  (1.626 m)   Wt 89.5 kg (197 lb 5 oz)   LMP  (LMP Unknown)   SpO2 96%   BMI 33.87 kg/m  SpO2: SpO2: 96 % O2 Device: O2 Device: Room Air O2 Flow Rate:    Intake/output summary:   Intake/Output Summary (Last 24 hours) at 03/24/2018 1214 Last data filed at 03/24/2018 0830 Gross per 24 hour  Intake 4206.67 ml  Output 1200 ml  Net 3006.67 ml   LBM: Last BM Date: 03/23/18 Baseline Weight: Weight: 88.9 kg (196 lb) Most recent weight: Weight: 89.5 kg (197 lb 5 oz)       Palliative Assessment/Data: 40%    Flowsheet Rows     Most Recent Value  Intake Tab  Referral Department  Hospitalist  Unit at Time of Referral  Other (Comment) [telementry/urology]  Palliative Care Primary Diagnosis  Cancer  Date Notified  03/22/18  Palliative Care Type  New Palliative care  Reason for referral  Clarify Goals of Care  Date of Admission  03/20/18  Date first seen by Palliative Care  03/23/18  # of days Palliative referral response time  1  Day(s)  # of days IP prior to Palliative referral  2  Clinical Assessment  Psychosocial & Spiritual Assessment  Palliative Care Outcomes      Patient Active Problem List   Diagnosis Date Noted  . Acute metabolic encephalopathy 03/55/9741  . Cirrhosis ([place]) 03/23/2018  . Thrombocytopenia ([place]) 03/23/2018  . Abnormal liver function   . Seizures ([place])   . Bilateral leg weakness 03/11/2018  . Metastatic colon cancer in female [facility]) 05/05/2017  . Dehydration 10/03/2016  . Severe episode of recurrent major depressive disorder, without psychotic features ([place]) 10/03/2016  . TMJ (temporomandibular joint syndrome) 05/07/2016  . Cephalalgia 05/07/2016  . Knee pain, left 09/04/2015  . Postlaminectomy syndrome of lumbar region 04/02/2015  . Anxiety and depression 12/18/2014  . Anemia 07/03/2014  . Vitamin D deficiency 07/03/2014  . Other fatigue 07/03/2014  . Essential hypertension, benign 06/30/2014  . Genital herpes 06/30/2014    Palliative Care Plan    Recommendations/Plan:  PMT meeting on 7/18 at 9:00 am.   I will advise that Nadiya will likely live longer with hospice care than with further chemo, but either way her time is likely very limited.   We will address code status.  Goals of Care and Additional Recommendations:  Limitations on Scope of Treatment: Full Scope Treatment  Code Status:  Full code  Prognosis:   Unable to determine  Will know more after PT / OT evaluations and once we see if Shamel will eat.   She has very aggressive metastatic cancer.  Her body responds very poorly to chemo.     Discharge Planning:  To Be Determined - potentially home with hospice.  Care plan was discussed with sister [name].  Thank you for allowing the Palliative Medicine Team to assist in the care of this patient.  Total time spent:  35 min.     Greater than 50%  of this time was spent counseling and coordinating care related to the above assessment and plan.  Florentina Jenny, PA-C Palliative Medicine  Please contact Palliative MedicineTeam phone at [phone] for questions and concerns between 7 am - 7 pm.   Please see AMION for individual provider pager numbers.

## 2018-03-24 NOTE — Progress Notes (Signed)
Patient has not voided this shift. Bladder scan shows 243. Md notified. Instructed to continue to monitor the patient.

## 2018-03-24 NOTE — Progress Notes (Signed)
LTM discontinued. No skin breakdown was seen. Dr Doree Albee was notified.

## 2018-03-24 NOTE — Progress Notes (Addendum)
Neurology Progress Note   S:// Patient seen and examined.  More awake today.  No overnight complaints   O:// Current vital signs: BP (!) 117/102   Pulse (!) 108   Temp 98.7 F (37.1 C) (Oral)   Resp (!) 28   Ht 5\' 4"  (1.626 m)   Wt 89.5 kg (197 lb 5 oz)   LMP  (LMP Unknown)   SpO2 96%   BMI 33.87 kg/m  Vital signs in last 24 hours: Temp:  [97.3 F (36.3 C)-98.7 F (37.1 C)] 98.7 F (37.1 C) (07/17 0800) Pulse Rate:  [88-129] 108 (07/17 0800) Resp:  [20-28] 28 (07/17 0800) BP: (117-138)/(68-104) 117/102 (07/17 0800) SpO2:  [96 %-99 %] 96 % (07/17 0800) General: Awake alert no acute distress H ENT: Normocephalic and atraumatic Lungs: Clear to auscultation bilaterally Cardiovascular: S1-S2 heard regular rate rhythm Abdomen: Nondistended nontender Extremities: Warm well perfused with intact pulses Neurological exam Patient is awake alert oriented to self and the fact that she is in the hospital.  She could not tell me what hospital what city she is in and could not tell me the date. She endorsed being confused and not aware of the day. Poor attention concentration.  Slow responses to all questions. Cranial nerves: Pupils are equal round reactive to light, extra ocular movements are intact, visual fields are full, face is symmetric, hearing intact, palate elevates midline, shoulder shrug intact, tongue strength normal. Motor exam: She has antigravity strength in all 4 extremities with may be subtle right upper extremity weakness. Sensory exam: Intact light touch all over Coordination: Difficult to perform finger-nose-finger for her but based on the reach for the food in her tray, no obvious dysmetria noted in the upper extremities.  Lower extremity is not tested. Gait testing was deferred at this time.  Medications  Current Facility-Administered Medications:  .  citalopram (CELEXA) tablet 20 mg, 20 mg, Oral, Daily, Mariel Aloe, MD, 20 mg at 03/24/18 5102 .  Ferrous  Fumarate (HEMOCYTE - 106 mg FE) tablet 106 mg of iron, 1 tablet, Oral, BID AC, Mariel Aloe, MD, 106 mg of iron at 03/23/18 1646 .  HYDROcodone-acetaminophen (NORCO/VICODIN) 5-325 MG per tablet 1-2 tablet, 1-2 tablet, Oral, Q6H PRN, Mariel Aloe, MD, 2 tablet at 03/23/18 2050 .  lactulose (CHRONULAC) 10 GM/15ML solution 10 g, 10 g, Oral, Daily, Mariel Aloe, MD, 10 g at 03/24/18 5852 .  [COMPLETED] levETIRAcetam (KEPPRA) IVPB 1000 mg/100 mL premix, 1,000 mg, Intravenous, Once, Stopped at 03/22/18 1905 **FOLLOWED BY** levETIRAcetam (KEPPRA) IVPB 500 mg/100 mL premix, 500 mg, Intravenous, BID, Mariel Aloe, MD, Last Rate: 400 mL/hr at 03/24/18 0811, 500 mg at 03/24/18 0811 .  LORazepam (ATIVAN) injection 1-2 mg, 1-2 mg, Intravenous, Q2H PRN, Schorr, Rhetta Mura, NP .  metoprolol tartrate (LOPRESSOR) tablet 50 mg, 50 mg, Oral, BID, Samuella Cota, MD, 50 mg at 03/24/18 7782 .  phenol (CHLORASEPTIC) mouth spray 1 spray, 1 spray, Mouth/Throat, PRN, Samuella Cota, MD Labs CBC    Component Value Date/Time   WBC 9.1 03/24/2018 0317   RBC 2.92 (L) 03/24/2018 0317   HGB 8.9 (L) 03/24/2018 0317   HCT 27.6 (L) 03/24/2018 0317   PLT 59 (L) 03/24/2018 0317   MCV 94.5 03/24/2018 0317   MCH 30.5 03/24/2018 0317   MCHC 32.2 03/24/2018 0317   RDW 22.6 (H) 03/24/2018 0317   LYMPHSABS 0.9 03/20/2018 1710   MONOABS 0.2 03/20/2018 1710   EOSABS 0.0 03/20/2018 1710  BASOSABS 0.0 03/20/2018 1710    CMP     Component Value Date/Time   NA 129 (L) 03/24/2018 0317   NA 137 10/06/2014   K 3.4 (L) 03/24/2018 0317   CL 97 (L) 03/24/2018 0317   CO2 20 (L) 03/24/2018 0317   GLUCOSE 126 (H) 03/24/2018 0317   BUN 11 03/24/2018 0317   BUN 17 10/06/2014   CREATININE 0.54 03/24/2018 0317   CREATININE 1.01 11/07/2016 1216   CALCIUM 8.6 (L) 03/24/2018 0317   CALCIUM 9.3 10/06/2014   PROT 6.6 03/24/2018 0317   ALBUMIN 2.3 (L) 03/24/2018 0317   AST 113 (H) 03/24/2018 0317   ALT 134 (H)  03/24/2018 0317   ALKPHOS 533 (H) 03/24/2018 0317   BILITOT 2.7 (H) 03/24/2018 0317   GFRNONAA >60 03/24/2018 0317   GFRNONAA >89 11/19/2015 1535   GFRAA >60 03/24/2018 0317   GFRAA >89 11/19/2015 1535    Imaging I have reviewed images in epic and the results pertinent to this consultation are: MRI of the brain reviewed-likely artifactual left cerebellar lesion which was suspicious for stroke but looks more like a T2 shine through.  Even if it were a stroke, unlikely to explain the current clinical presentation.  Assessment:  47 year old woman with past medical history of metastatic colon cancer, brought in for altered mental status and increasing confusion for 3 to 4 days prior to presentation.  She had multiple metabolic derangements including hyponatremia and deranged liver function tests, with high ammonia levels. She also was noted to have seizure activity with staring and arm raising. Spot EEG done showed mostly slowing consistent with encephalopathy but there was also concern for possible brief ictal event. She was started on Keppra and her mentation has been gradually improving, making underlying seizures as a possible contributor to her altered mental status along with a toxic metabolic encephalopathy. I does not show any evidence of metastasis or carcinomatous meningitis.  Impression: Toxic metabolic encephalopathy New onset seizure in the setting of possibly lower seizure threshold secondary to toxic metabolic encephalopathy History of metastatic colon cancer-not a candidate for chemo   Recommendations: -Patient is currently hooked onto LTM-formal reading pending. -I will await the LTM reading to decide whether to discontinue LTM at this time. -Continue on Keppra 500 twice daily -Correct toxic and metabolic derangements per primary team as you are. -I will update my recommendations upon the availability of the LTM EEG results. -Appreciate palliative medicine team care of  this patient.  I think it is appropriate to have family meeting for discussions of goals of care and possible hospice as recommended by palliative medicine.  Irrespective of what ever decision is made, I would recommend continuing her on Keppra whatever her goals of care or CODE STATUS might be going forward.  -- Amie Portland, MD [facility] Pager: [phone] If 7pm to 7am, please call on call as listed on AMION.   Addendum EEG report  reviewed.  Moderately abnormal continuous video EEG due to generalized background slowing which was sharply contoured, the sharp morphology and slowing improved overnight.  This reflects diffuse cerebral disturbance.  Possible underlying cortical irritability but no seizures. Can discontinue LTM EEG for now. Continue antiepileptics.

## 2018-03-24 NOTE — Progress Notes (Signed)
PROGRESS NOTE    Brittney Young  FUX:[id number] DOB: [birth year] DOA: 03/20/2018 PCP: Donella Stade, PA-C   Brief Narrative:  Patient 47 year old female with history of metastatic cecal adenocarcinoma, status post chemotherapy presented with acute encephalopathy.  She is being managed for encephalopathy, pneumonia, sepsis and hyponatremia.  She underwent treatment for pneumonia with empiric antibiotics.  Also underwent diagnostic paracentesis.  During the hospital stay, C. difficile activity.  Neurology was consulted.  Neurology recommended long-term video monitoring EEG.  There is also concern about metastatic disease contributing to her change in mental status.  Palliative care is also on both assistant with hospice care and hospice transition.   Assessment & Plan:   Active Problems:   Metastatic colon cancer in female [facility])   Abnormal liver function   Seizures (HCC)   Acute metabolic encephalopathy   Cirrhosis ([place])   Thrombocytopenia (HCC)  Acute metabolic encephalopathy: Presented with altered mental status.  Initially thought to be secondary to metastatic liver disease but her serum ammonia was normal.  MRI of the brain did not show any metastatic lesion or abnormal meningeal enhancement.  There was concern for left cerebellar infarct but as per neurology appears most consistent with artifact.  Infectious work-up negative.  Most likely her change in  mental status is secondary to advanced liver disease secondary to metastatic adenocarcinoma of the colon.  Continue to do EEG to rule out seizures as per neurology.  On Keppra. Continue seizure precaution.  Metastatic adenocarcinoma of the colon: Metastatic disease to the liver and increased LFT.  Last chemotherapy was on July 8.  As per last oncology note, she is no longera candidate for chemotherapy and the recommended hospice care.  Palliative care on board.  Sepsis considered on admission: Pneumonia suspected on admission.  She  was never hypoxic.  CT chest showed bilateral pleural effusion, atelectasis no infiltrate, widespread metastatic disease.  Blood cultures no growth today.  Respiratory viral panel negative.  Urine culture did not show any growth.  Peritoneal fluid did not show any growth.  She is afebrile and hemodynamically stable.  Antibiotics discontinued.  Cirrhosis: Secondary to metastatic disease with associated hyperbilirubinemia, LFTs, thrombocytopenia and ascites.  Continue supportive care.  She was on hydrochlorthiazide which has been discontinued due to hyponatremia.  Hyponatremia: Slight improvement.  Continue to monitor.  Thrombocytopenia: Most likely associated with chemotherapy and cirrhosis.  We will continue to monitor.  Anemia of chronic disease: Likely associated with chemotherapy.  We will continue to monitor CBC.  Dysphagia: Speech therapy recommended dysphagia 1, thin liquid.  Social/ethics/poor prognosis: Patient has a 68-year-old son and he is unable to make decision.  Currently patient's sister is assisting on making the decision. Palliative Care following regarding goals of care, CODE STATUS discussion and possible transition to hospice.  Currently she is full code.  Patient also has indeterminate left breast mass     DVT prophylaxis: SCD Code Status: Full Family Communication: None present at the bedside Disposition Plan: Needs to be determined, most likely hospice at home   Consultants: palliative care, neurology  Procedures: EEG  Antimicrobials: None  Subjective: Patient seen and examined the bedside this morning.  Currently on video EEG.  She is hemodynamically stable.  She looks comfortable and not in any kind of distress.  Alert but oriented to self only.  Objective: Vitals:   03/23/18 1623 03/23/18 2024 03/23/18 2316 03/24/18 0333  BP: (!) 137/104 (!) 137/97 138/68 127/85  Pulse: (!) 129 (!) 116 88  Resp: 20 (!) 28 (!) 28 (!) 24  Temp: 98.2 F (36.8 C) (!)  97.3 F (36.3 C) 97.6 F (36.4 C) 97.8 F (36.6 C)  TempSrc: Oral Oral Oral Oral  SpO2: 98% 97% 99% 98%  Weight:      Height:        Intake/Output Summary (Last 24 hours) at 03/24/2018 0750 Last data filed at 03/24/2018 0600 Gross per 24 hour  Intake 3866.67 ml  Output 1200 ml  Net 2666.67 ml   Filed Weights   03/20/18 1803 03/22/18 2044  Weight: 88.9 kg (196 lb) 89.5 kg (197 lb 5 oz)    Examination:  General exam: Appears calm and comfortable ,Not in distress,average built HEENT:PERRL,Oral mucosa moist, Ear/Nose normal on gross exam Respiratory system: Bilateral decreased air entry in the bases  cardiovascular system: S1 & S2 heard, RRR. No JVD, murmurs, rubs, gallops or clicks.+1 pedal edema Gastrointestinal system: Abdomen is distended, soft and nontender.. Normal bowel sounds heard. Central nervous system: Alert but oriented to self only. No focal neurological deficits. Extremities: 1+  edema, no clubbing ,no cyanosis, distal peripheral pulses palpable. Skin: No rashes, lesions or ulcers,no icterus ,no pallor     Data Reviewed: I have personally reviewed following labs and imaging studies  CBC: Recent Labs  Lab 03/20/18 1710 03/22/18 0447 03/24/18 0317  WBC 7.7 9.6 9.1  NEUTROABS 6.7  --   --   HGB 10.6* 8.6* 8.9*  HCT 31.7* 25.6* 27.6*  MCV 91.1 94.1 94.5  PLT 91* 59* 59*   Basic Metabolic Panel: Recent Labs  Lab 03/20/18 1710 03/22/18 0447 03/22/18 1634 03/24/18 0317  NA 129* 126* 129* 129*  K 4.2 3.4* 3.6 3.4*  CL 92* 95* 93* 97*  CO2 23 20* 19* 20*  GLUCOSE 118* 97 110* 126*  BUN 20 14 15 11   CREATININE 0.73 0.51 0.50 0.54  CALCIUM 8.8* 8.0* 8.7* 8.6*   GFR: Estimated Creatinine Clearance: 96.2 mL/min (by C-G formula based on SCr of 0.54 mg/dL). Liver Function Tests: Recent Labs  Lab 03/20/18 1710 03/22/18 1634 03/24/18 0317  AST 223* 140* 113*  ALT 251* 160* 134*  ALKPHOS 588* 513* 533*  BILITOT 5.6* 3.9* 2.7*  PROT 7.7 7.5 6.6    ALBUMIN 3.1* 2.8* 2.3*   No results for input(s): LIPASE, AMYLASE in the last 168 hours. Recent Labs  Lab 03/20/18 2039 03/22/18 1627  AMMONIA 47* 24   Coagulation Profile: No results for input(s): INR, PROTIME in the last 168 hours. Cardiac Enzymes: Recent Labs  Lab 03/20/18 2202 03/21/18 0454 03/21/18 1001  TROPONINI 0.04* 0.04* 0.04*   BNP (last 3 results) No results for input(s): PROBNP in the last 8760 hours. HbA1C: No results for input(s): HGBA1C in the last 72 hours. CBG: Recent Labs  Lab 03/20/18 1738  GLUCAP 105*   Lipid Profile: No results for input(s): CHOL, HDL, LDLCALC, TRIG, CHOLHDL, LDLDIRECT in the last 72 hours. Thyroid Function Tests: Recent Labs    03/22/18 1634  TSH 3.567   Anemia Panel: Recent Labs    03/22/18 1634  VITAMINB12 1,059*   Sepsis Labs: Recent Labs  Lab 03/20/18 1816 03/21/18 0103 03/21/18 0454  LATICACIDVEN 3.39* 1.8 1.9    Recent Results (from the past 240 hour(s))  Culture, blood (routine x 2)     Status: None (Preliminary result)   Collection Time: 03/20/18  5:49 PM  Result Value Ref Range Status   Specimen Description   Final    BLOOD RIGHT ANTECUBITAL  Performed at [facility], [place] [address] [place], [place] [address]    Special Requests   Final    BOTTLES DRAWN AEROBIC AND ANAEROBIC Blood Culture adequate volume Performed at [place] [address], [place], [place] [address]    Culture   Final    NO GROWTH 3 DAYS Performed at [facility], [place] [address], [place], [place] [address]    Report Status PENDING  Incomplete  Culture, blood (routine x 2)     Status: None (Preliminary result)   Collection Time: 03/20/18  5:59 PM  Result Value Ref Range Status   Specimen Description   Final    BLOOD RIGHT ANTECUBITAL Performed at [facility] [address] [place], [place] [address]    Special Requests   Final    BOTTLES DRAWN AEROBIC  AND ANAEROBIC Blood Culture adequate volume Performed at [facility] [address], [place], [place] [address]    Culture   Final    NO GROWTH 3 DAYS Performed at [facility], [place] [address] [place], [place] [address]    Report Status PENDING  Incomplete  Respiratory Panel by PCR     Status: None   Collection Time: 03/21/18  7:50 AM  Result Value Ref Range Status   Adenovirus NOT DETECTED NOT DETECTED Final   Coronavirus 229E NOT DETECTED NOT DETECTED Final   Coronavirus HKU1 NOT DETECTED NOT DETECTED Final   Coronavirus NL63 NOT DETECTED NOT DETECTED Final   Coronavirus OC43 NOT DETECTED NOT DETECTED Final   Metapneumovirus NOT DETECTED NOT DETECTED Final   Rhinovirus / Enterovirus NOT DETECTED NOT DETECTED Final   Influenza A NOT DETECTED NOT DETECTED Final   Influenza B NOT DETECTED NOT DETECTED Final   Parainfluenza Virus 1 NOT DETECTED NOT DETECTED Final   Parainfluenza Virus 2 NOT DETECTED NOT DETECTED Final   Parainfluenza Virus 3 NOT DETECTED NOT DETECTED Final   Parainfluenza Virus 4 NOT DETECTED NOT DETECTED Final   Respiratory Syncytial Virus NOT DETECTED NOT DETECTED Final   Bordetella pertussis NOT DETECTED NOT DETECTED Final   Chlamydophila pneumoniae NOT DETECTED NOT DETECTED Final   Mycoplasma pneumoniae NOT DETECTED NOT DETECTED Final    Comment: Performed at [facility], [place] [address], [place], [place] [address]  Culture, body fluid-bottle     Status: None (Preliminary result)   Collection Time: 03/21/18  1:42 PM  Result Value Ref Range Status   Specimen Description PERITONEAL  Final   Special Requests NONE  Final   Culture   Final    NO GROWTH 2 DAYS Performed at [facility], [place] [address] [place], [place] [address]    Report Status PENDING  Incomplete  Gram stain     Status: None   Collection Time: 03/21/18  1:42 PM  Result Value Ref Range Status   Specimen Description PERITONEAL  Final   Special Requests  NONE  Final   Gram Stain   Final    RARE WBC PRESENT, PREDOMINANTLY PMN NO ORGANISMS SEEN Performed at [facility], [place] [address] [place],  [address]    Report Status 03/22/2018 FINAL  Final  Culture, Urine     Status: None   Collection Time: 03/21/18  4:39 PM  Result Value Ref Range Status   Specimen Description   Final    URINE, RANDOM Performed at [facility] [address]  [place], [place] [address]    Special Requests   Final    NONE Performed at [facility], [place] [address], [place], [place] [address]    Culture   Final    NO GROWTH Performed at [facility], [place] [address] [place], [place] [address]    Report Status 03/23/2018 FINAL  Final  MRSA PCR Screening     Status: None   Collection Time: 03/23/18  1:42 AM  Result Value Ref Range Status   MRSA by PCR NEGATIVE NEGATIVE Final    Comment:        The GeneXpert MRSA Assay (FDA approved for NASAL specimens only), is one component of a comprehensive MRSA colonization surveillance program. It is not intended to diagnose MRSA infection nor to guide or monitor treatment for MRSA infections. Performed at [facility], [place] [address], [place], [place] [address]          Radiology Studies: Mr Jeri Cos DS Contrast  Result Date: 03/22/2018 CLINICAL DATA:  Altered mental status. History of metastatic colon cancer EXAM: MRI HEAD WITHOUT AND WITH CONTRAST TECHNIQUE: Multiplanar, multiecho pulse sequences of the brain and surrounding structures were obtained without and with intravenous contrast. CONTRAST:  62mL MULTIHANCE GADOBENATE DIMEGLUMINE 529 MG/ML IV SOLN COMPARISON:  CT head 03/20/2018 FINDINGS: Brain: Small acute infarct left lateral cerebellum which is not enhance but shows restricted diffusion. No surrounding edema. Small white matter hyperintensities bilaterally most likely due to chronic microvascular ischemia. Negative for hemorrhage or mass.  Normal enhancement postcontrast infusion. No evidence of metastatic disease. Vascular: Normal arterial flow voids Skull and upper cervical spine: No skull lesion. Extensive dural ossification bilaterally is likely degenerative. Sinuses/Orbits: Negative Other: None IMPRESSION: Small acute infarct left lateral cerebellum. Negative for metastatic disease to the head. Electronically Signed   By: Franchot Gallo M.D.   On: 03/22/2018 15:22        Scheduled Meds: . citalopram  20 mg Oral Daily  . enoxaparin (LOVENOX) injection  40 mg Subcutaneous Q24H  . Ferrous Fumarate  1 tablet Oral BID AC  . lactulose  10 g Oral Daily  . metoprolol tartrate  50 mg Oral BID  . potassium chloride SA  20 mEq Oral Q breakfast  . potassium chloride  40 mEq Oral Once   Continuous Infusions: . levETIRAcetam 500 mg (03/23/18 2034)     LOS: 4 days    Time spent: 35 mins.More than 50% of that time was spent in counseling and/or coordination of care.      Shelly Coss, MD [facility] Pager [phone]  If 7PM-7AM, please contact night-coverage [email or web address] Password [email or web address] 03/24/2018, 7:50 AM

## 2018-03-24 NOTE — Evaluation (Signed)
Physical Therapy Evaluation Patient Details Name: Brittney Young MRN: [id number] DOB: June 19, 1971 Today's Date: 03/24/2018   History of Present Illness  Pt is a 47 y.o. female who presented with confusion; w/u still pending. CXR on admission showed R lung base opacity consistent with PNA but CT Chest 7/14 shouwed no opacities. PMH includes metastatic adenocarcinoma of the colon with mets to the liver, [facility]. Per MD note, oncology plans to no longer offer chemotherapy as pt had significantly declined. Plan was for hospice care at home.  Clinical Impression  Pt very quiet through evaluation and pt's sister answered most questions about house she would discharge to and functioning level. PTA pt progressively getting weaker, experienced fall on steps getting into her house and had purchased RW to help with ambulation in the house Pt minA for powerup to RW, upon standing pt found to be incontinent of stool, sat back down, in standing back up for pericare pt HR rose to 125 bpm and pt visibly fatigued. Pt required modA for LE management back into bed. Pt reports she would really like to go to the bathroom. PT educated on how with increase in HR and decrease strength in her LE it would be too dangerous to try and to get to the bathroom. Family to meet with Hospice in the morning. Given pt's weakness and decreased endurance PT would recommend 24 hr care (which sister says can't be provided) or SNF level rehab.      Follow Up Recommendations Other (comment);SNF(pending decision on Hospice, requires 24 hr supervision)    Equipment Recommendations  Other (comment)(TBD)    Recommendations for Other Services       Precautions / Restrictions Precautions Precautions: Fall Restrictions Weight Bearing Restrictions: No      Mobility  Bed Mobility Overal bed mobility: Needs Assistance Bed Mobility: Sit to Supine       Sit to supine: Mod assist   General bed mobility comments:  modA for LE management into bed  Transfers Overall transfer level: Needs assistance Equipment used: Rolling walker (2 wheeled) Transfers: Sit to/from Stand Sit to Stand: Min assist         General transfer comment: minA for power up and standing x2, pt incontinent of stool and required pericare, pt able to stand a second time for cleaning. HR increased to 125 bpm with standing of 3 minutes  Ambulation/Gait             General Gait Details: did not attempt secondary to weakness and incresed HR with standing        Balance Overall balance assessment: Needs assistance Sitting-balance support: Feet supported;No upper extremity supported Sitting balance-Leahy Scale: Fair     Standing balance support: Bilateral upper extremity supported Standing balance-Leahy Scale: Poor Standing balance comment: requires UE support on RW                             Pertinent Vitals/Pain Pain Assessment: No/denies pain    Home Living Family/patient expects to be discharged to:: Private residence Living Arrangements: Other relatives Available Help at Discharge: Family;Available PRN/intermittently Type of Home: House Home Access: Stairs to enter Entrance Stairs-Rails: None Entrance Stairs-Number of Steps: 2 Home Layout: Two level;Able to live on main level with bedroom/bathroom Home Equipment: Gilford Rile - 2 wheels      Prior Function Level of Independence: Needs assistance   Gait / Transfers Assistance Needed: pt has been getting progressively weaker,  bought RW to assist in walking as she fell last week  ADL's / Homemaking Assistance Needed: able to perform BADLs, family provided iADLs           Extremity/Trunk Assessment   Upper Extremity Assessment Upper Extremity Assessment: Generalized weakness    Lower Extremity Assessment Lower Extremity Assessment: RLE deficits/detail;LLE deficits/detail RLE Deficits / Details: pitting edema in calf and ankle, AAROM WFL,  grossly assessed in sitting, hip flex 2+/5, knee ext 2+/5, knee flex 3/5, ankle dorsi 3+/5, plantar flex 3+/5, reports she fell on her L side a week ago and her R knee is sore RLE Sensation: WNL LLE Deficits / Details: pitting edema in calf and ankle, AAROM WFL, grossly assessed in sitting, hip flex 2+/5, knee ext 2+/5, knee flex 3/5, ankle dorsi 3+/5, plantar flex 3+/5 LLE Sensation: WNL       Communication   Communication: No difficulties  Cognition Arousal/Alertness: Awake/alert Behavior During Therapy: Flat affect Overall Cognitive Status: Impaired/Different from baseline Area of Impairment: Problem solving;Following commands                       Following Commands: Follows multi-step commands with increased time     Problem Solving: Slow processing;Decreased initiation;Requires verbal cues;Requires tactile cues General Comments: Pt very flat, requires increased time, and verbal and tactile cuing for movement      General Comments General comments (skin integrity, edema, etc.): Pt sister present and provided information on sister's house as pt plans to go there at d/c. Pt very quiet and allowed her sister to answer questions        Assessment/Plan    PT Assessment Patient needs continued PT services  PT Problem List Decreased strength;Decreased activity tolerance;Decreased balance;Decreased mobility;Decreased cognition;Decreased knowledge of use of DME;Cardiopulmonary status limiting activity       PT Treatment Interventions DME instruction;Gait training;Functional mobility training;Therapeutic activities;Therapeutic exercise;Balance training;Cognitive remediation;Patient/family education    PT Goals (Current goals can be found in the Care Plan section)  Acute Rehab PT Goals Patient Stated Goal: did not answer when asked PT Goal Formulation: With patient Time For Goal Achievement: 04/07/18 Potential to Achieve Goals: Poor    Frequency Min 3X/week    Barriers to discharge Decreased caregiver support         AM-PAC PT "6 Clicks" Daily Activity  Outcome Measure Difficulty turning over in bed (including adjusting bedclothes, sheets and blankets)?: A Little Difficulty moving from lying on back to sitting on the side of the bed? : Unable Difficulty sitting down on and standing up from a chair with arms (e.g., wheelchair, bedside commode, etc,.)?: Unable Help needed moving to and from a bed to chair (including a wheelchair)?: [name] Help needed walking in hospital room?: [name] Help needed climbing 3-5 steps with a railing? : Total 6 Click Score: 10    End of Session Equipment Utilized During Treatment: Gait belt Activity Tolerance: Patient limited by fatigue Patient left: in bed;with call bell/phone within reach Nurse Communication: Mobility status PT Visit Diagnosis: Unsteadiness on feet (R26.81);Other abnormalities of gait and mobility (R26.89);Muscle weakness (generalized) (M62.81);History of falling (Z91.81);Difficulty in walking, not elsewhere classified (R26.2)    Time: 1550-1620 PT Time Calculation (min) (ACUTE ONLY): 30 min   Charges:   PT Evaluation $PT Eval Moderate Complexity: 1 Mod PT Treatments $Therapeutic Activity: 8-22 mins   PT G Codes:        Vikash Nest B. Migdalia Dk PT, DPT Acute Rehabilitation  [phone] Pager (  [phone]    [place] 03/24/2018, 4:51 PM

## 2018-03-24 NOTE — Plan of Care (Signed)
Discussed plan of care with patient.  Patient is non-verbal.  She sometimes answers with "yes" or "no".  No teach back displayed.

## 2018-03-24 NOTE — Procedures (Signed)
LTM-EEG Report  HISTORY: Continuous video-EEG monitoring performed for 47 year old with encephalopathy, possible seizures..  ACQUISITION: International 10-20 system for electrode placement; 18 channels with additional eyes linked to ipsilateral ears and EKG. Additional T1-T2 electrodes were used. Continuous video recording obtained.   EEG NUMBER: MEDICATIONS:  Day 1: LEV  DAY #1: from 5830 03/23/18 to 0730 03/24/18  BACKGROUND: An overall medium voltage continuous recording with good spontaneous variability and reactivity. Waking background consisted of a medium voltage 7-8Hz  posterior dominant rhythm bilaterally with low voltage beta activity in the bilateral frontocentral regions and frequent sharply contoured 2-5Hz  activity bilaterally. After 0600, there was considerable improvement in this sharply contoured slowing. Sleep was captured with normal stage II sleep architecture.  EPILEPTIFORM/PERIODIC ACTIVITY: sharply contoured 2-5Hz  activity intermixed with the background, improved overnight SEIZURES: none  EVENTS: none reported  EKG: no significant arrhythmia  SUMMARY: This was moderately abnormal continuous video EEG due to generalized background slowing which was sharply contoured. The sharp morphology to the slowing improved overnight. These patterns were indicative of a diffuse cerebral disturbance with underlying cortical irritability. No seizures were seen.

## 2018-03-25 LAB — CULTURE, BLOOD (ROUTINE X 2)
CULTURE: NO GROWTH
Culture: NO GROWTH
SPECIAL REQUESTS: ADEQUATE
Special Requests: ADEQUATE

## 2018-03-25 LAB — BASIC METABOLIC PANEL
Anion gap: 12 (ref 5–15)
BUN: 11 mg/dL (ref 6–20)
CALCIUM: 8.5 mg/dL — AB (ref 8.9–10.3)
CO2: 23 mmol/L (ref 22–32)
CREATININE: 0.58 mg/dL (ref 0.44–1.00)
Chloride: 98 mmol/L (ref 98–111)
GFR calc Af Amer: >60 mL/min (ref >60)
GLUCOSE: 88 mg/dL (ref 70–99)
Potassium: 4.1 mmol/L (ref 3.5–5.1)
SODIUM: 133 mmol/L — AB (ref 135–145)

## 2018-03-25 MED ORDER — MORPHINE SULFATE (CONCENTRATE) 10 MG/0.5ML PO SOLN: 10.0000 mg | ORAL | Status: DC | PRN

## 2018-03-25 MED ORDER — ONDANSETRON HCL 4 MG/2ML IJ SOLN
4.0000 mg | Freq: Three times a day (TID) | INTRAMUSCULAR | Status: DC
  Administered 2018-03-25 – 2018-03-29 (×13): 4 mg via INTRAVENOUS
  Filled 2018-03-25 (×13): qty 2

## 2018-03-25 MED ORDER — ONDANSETRON 4 MG PO TBDP: 4.0000 mg | ORAL_TABLET | ORAL | Status: DC | PRN

## 2018-03-25 MED ORDER — SODIUM CHLORIDE 0.9 % IV SOLN: INTRAVENOUS | Status: DC

## 2018-03-25 MED ORDER — DEXAMETHASONE 4 MG PO TABS
4.0000 mg | ORAL_TABLET | Freq: Every day | ORAL | Status: DC
  Administered 2018-03-25 – 2018-03-29 (×5): 4 mg via ORAL
  Filled 2018-03-25 (×5): qty 1

## 2018-03-25 MED ORDER — LORAZEPAM 2 MG/ML PO CONC
0.2500 mg | Freq: Four times a day (QID) | ORAL | Status: DC | PRN
  Administered 2018-03-28: 0.5 mg via ORAL
  Filled 2018-03-25: qty 1

## 2018-03-25 MED ORDER — SODIUM CHLORIDE 0.9% FLUSH: 10.0000 mL | INTRAVENOUS | Status: DC | PRN

## 2018-03-25 MED ORDER — SODIUM CHLORIDE 0.9% FLUSH
10.0000 mL | Freq: Two times a day (BID) | INTRAVENOUS | Status: DC
  Administered 2018-03-25 – 2018-03-28 (×7): 10 mL
  Administered 2018-03-29: 20 mL

## 2018-03-25 NOTE — Care Management Note (Addendum)
Case Management Note  Patient Details  Name: Brittney Young MRN: [id number] Date of Birth: June 25, 1971  Subjective/Objective:     From home with 45 year old son,  history of metastatic adenocarcinoma of the colon, with mets to the liver. Patient presented with confusion. Palliative consulted, they will plan to meet with family.   7/18 Tomi Bamberger RN, BSN- patient will be going home with [facility]osp[facility] on 7/21 referral made to [facility] with [facility] and also gave her referral for [facility].  Velta Addison will be coming over to speak with patient and family today.  Patient will be transported by car per patient.  Patient states to check with her again at discharge because she may change her mind and decide on ambulance transport.  7/22 Tomi Bamberger RN, BSN - most likely for dc today, will need ambulance transport. NCM spoke with [name] , she states she would like patient to be transported at 1 pm to [address] , [place] [address].  NCM informed MD and Clifton Custard, and [facility] with [facility].  Transport set up with [facility] , forms are on the chart.                    Action/Plan: DC home with Hospice [facility] at discharge.  Expected Discharge Date:                  Expected Discharge Plan:  Home w Hospice Care  In-House Referral:     Discharge planning Services  CM Consult  Post Acute Care Choice:    Choice offered to:  Patient  DME Arranged:    DME Agency:     HH Arranged:   RN [place] Agency:   [facility]  [place]  Status of Service:  Completed, signed off  If discussed at [place] of Stay Meetings, dates discussed:    Additional Comments:  Zenon Mayo, RN 03/25/2018, 10:19 AM

## 2018-03-25 NOTE — Progress Notes (Signed)
Neurology Progress Note   S:// Seen and examined. Much more awake and aware.   O:// Current vital signs: BP 110/79 (BP Location: Right Arm)   Pulse 100   Temp 98.2 F (36.8 C) (Oral)   Resp (!) 24   Ht 5\' 4"  (1.626 m)   Wt 89.5 kg (197 lb 5 oz)   LMP  (LMP Unknown)   SpO2 94%   BMI 33.87 kg/m  Vital signs in last 24 hours: Temp:  [97.7 F (36.5 C)-98.4 F (36.9 C)] 98.2 F (36.8 C) (07/18 0737) Pulse Rate:  [97-113] 100 (07/18 0416) Resp:  [20-26] 24 (07/18 0017) BP: (105-142)/(76-96) 110/79 (07/18 0737) SpO2:  [94 %-98 %] 94 % (07/18 0416) General: Awake alert no acute distress H ENT: Normocephalic and atraumatic Lungs: Clear to auscultation bilaterally Cardiovascular: S1-S2 heard regular rate rhythm Abdomen: Nondistended nontender Extremities: Warm well perfused with intact pulses Neurological exam Patient is awake alert oriented to self and the fact that she is in the hospital.  She could not tell me what hospital what city she is in and could not tell me the date. Endorsed being sad due to no hope for cure for her cancer. Reduced attention concentration.  Slow responses to all questions but appropriate responses to all questions. Cranial nerves: Pupils are equal round reactive to light, extra ocular movements are intact, visual fields are full, face is symmetric, hearing intact, palate elevates midline, shoulder shrug intact, tongue strength normal. Motor exam: She has antigravity strength in all 4 extremities with may be subtle right upper extremity weakness. Sensory exam: Intact light touch all over Coordination: No dysmetria. No asterixis Gait testing was deferred at this time.   Medications  Current Facility-Administered Medications:  .  0.9 %  sodium chloride infusion, , Intravenous, Continuous, Adhikari, Amrit, MD .  citalopram (CELEXA) tablet 20 mg, 20 mg, Oral, Daily, Mariel Aloe, MD, 20 mg at 03/24/18 8185 .  Ferrous Fumarate (HEMOCYTE - 106 mg FE)  tablet 106 mg of iron, 1 tablet, Oral, BID AC, Mariel Aloe, MD, 106 mg of iron at 03/24/18 0826 .  HYDROcodone-acetaminophen (NORCO/VICODIN) 5-325 MG per tablet 1-2 tablet, 1-2 tablet, Oral, Q6H PRN, Mariel Aloe, MD, 2 tablet at 03/23/18 2050 .  lactulose (CHRONULAC) 10 GM/15ML solution 10 g, 10 g, Oral, Daily, Mariel Aloe, MD, 10 g at 03/24/18 6314 .  [COMPLETED] levETIRAcetam (KEPPRA) IVPB 1000 mg/100 mL premix, 1,000 mg, Intravenous, Once, Stopped at 03/22/18 1905 **FOLLOWED BY** levETIRAcetam (KEPPRA) IVPB 500 mg/100 mL premix, 500 mg, Intravenous, BID, Mariel Aloe, MD, Last Rate: 400 mL/hr at 03/24/18 2138, 500 mg at 03/24/18 2138 .  LORazepam (ATIVAN) injection 1-2 mg, 1-2 mg, Intravenous, Q2H PRN, Schorr, Rhetta Mura, NP .  metoprolol tartrate (LOPRESSOR) tablet 50 mg, 50 mg, Oral, BID, Samuella Cota, MD, 50 mg at 03/24/18 2139 .  ondansetron (ZOFRAN) injection 4 mg, 4 mg, Intravenous, Q6H PRN, Shelly Coss, MD, 4 mg at 03/24/18 2308 .  phenol (CHLORASEPTIC) mouth spray 1 spray, 1 spray, Mouth/Throat, PRN, Samuella Cota, MD .  potassium chloride 20 MEQ/15ML (10%) solution 20 mEq, 20 mEq, Oral, Q breakfast, Adhikari, Amrit, MD .  sodium chloride flush (NS) 0.9 % injection 10-40 mL, 10-40 mL, Intracatheter, Q12H, Adhikari, Amrit, MD .  sodium chloride flush (NS) 0.9 % injection 10-40 mL, 10-40 mL, Intracatheter, PRN, Shelly Coss, MD Labs CBC    Component Value Date/Time   WBC 9.1 03/24/2018 0317   RBC 2.92 (  L) 03/24/2018 0317   HGB 8.9 (L) 03/24/2018 0317   HCT 27.6 (L) 03/24/2018 0317   PLT 59 (L) 03/24/2018 0317   MCV 94.5 03/24/2018 0317   MCH 30.5 03/24/2018 0317   MCHC 32.2 03/24/2018 0317   RDW 22.6 (H) 03/24/2018 0317   LYMPHSABS 0.9 03/20/2018 1710   MONOABS 0.2 03/20/2018 1710   EOSABS 0.0 03/20/2018 1710   BASOSABS 0.0 03/20/2018 1710    CMP     Component Value Date/Time   NA 133 (L) 03/25/2018 0630   NA 137 10/06/2014   K 4.1  03/25/2018 0630   CL 98 03/25/2018 0630   CO2 23 03/25/2018 0630   GLUCOSE 88 03/25/2018 0630   BUN 11 03/25/2018 0630   BUN 17 10/06/2014   CREATININE 0.58 03/25/2018 0630   CREATININE 1.01 11/07/2016 1216   CALCIUM 8.5 (L) 03/25/2018 0630   CALCIUM 9.3 10/06/2014   PROT 6.6 03/24/2018 0317   ALBUMIN 2.3 (L) 03/24/2018 0317   AST 113 (H) 03/24/2018 0317   ALT 134 (H) 03/24/2018 0317   ALKPHOS 533 (H) 03/24/2018 0317   BILITOT 2.7 (H) 03/24/2018 0317   GFRNONAA >60 03/25/2018 0630   GFRNONAA >89 11/19/2015 1535   GFRAA >60 03/25/2018 0630   GFRAA >89 11/19/2015 1535   Imaging I have reviewed images in epic and the results pertinent to this consultation are: MRI of the brain reviewed-likely artifactual left cerebellar lesion which was suspicious for stroke but looks more like a T2 shine through.  Even if it were a stroke, unlikely to explain the current clinical presentation.   Assessment:  47/F with past medical history of metastatic colon cancer, brought in for altered mental status and increasing confusion for 3 to 4 days prior to presentation.  She had multiple metabolic derangements including hyponatremia and deranged liver function tests, with high ammonia levels. She also was noted to have seizure activity with staring and arm raising. Spot EEG done showed mostly slowing consistent with encephalopathy but there was also concern for possible brief ictal event. She was started on Keppra and her mentation has been gradually improving, making underlying seizures as a possible contributor to her altered mental status along with a toxic metabolic encephalopathy a credible diagnosis. LTM EEG done over >24h with no seizures. MRI does not show any evidence of metastasis or carcinomatous meningitis.  Impression: Toxic metabolic encephalopathy New onset seizure in the setting of possibly lower seizure threshold secondary to toxic metabolic encephalopathy History of metastatic colon  cancer-not a candidate for chemo  Recommendations: Keppra 500 BID Seizure precautions No follow up testing unless change in exam Possibly transitioning to hospice care per primary. Please call us back as needed.  -- Amie Portland, MD [facility] Pager: [phone] If 7pm to 7am, please call on call as listed on AMION.

## 2018-03-25 NOTE — Progress Notes (Signed)
OT Cancellation Note  Patient Details Name: Brittney Young MRN: [id number] DOB: Sep 26, 1970   Cancelled Treatment:    Reason Eval/Treat Not Completed: Other (comment). OT orders discontinued, pt comfort care. Pt/family meeting with hospice this morning and pt will dc home with hospice  [name] 03/25/2018, 10:42 AM

## 2018-03-25 NOTE — Progress Notes (Signed)
PROGRESS NOTE    Brittney Young  KWI:[id number] DOB: 1971-08-26 DOA: 03/20/2018 PCP: Donella Stade, PA-C   Brief Narrative:  Patient 47 year old female with history of metastatic cecal adenocarcinoma, status post chemotherapy presented with acute encephalopathy.  She is being managed for encephalopathy, pneumonia, sepsis and hyponatremia.  She underwent treatment for pneumonia with empiric antibiotics.  Also underwent diagnostic paracentesis.  During the hospital stay, she also developed seizure activity.  Neurology was consulted.  Neurology recommended long-term video monitoring EEG.  There was also concern about metastatic disease contributing to her change in mental status.  Palliative care is assisting on transition of her care to hospice.Plan for conducting meeting today with her sister. The goal is to set up Hospice service at home.  Assessment & Plan:   Active Problems:   Metastatic colon cancer in female [name])   Abnormal liver function   Seizures (HCC)   Acute metabolic encephalopathy   Cirrhosis ([place])   Thrombocytopenia (HCC)  Acute metabolic encephalopathy: Presented with altered mental status.  Initially thought to be secondary to metastatic liver disease but her serum ammonia was normal.  MRI of the brain did not show any metastatic lesion or abnormal meningeal enhancement.  There was concern for left cerebellar infarct but as per neurology appears most consistent with artifact.  Infectious work-up negative.  Most likely her change in  mental status is secondary to advanced liver disease secondary to metastatic adenocarcinoma of the colon vs post ictal phage vs toxic metabolic encephalopathy.Neurology was following .She is  on Keppra.LTM EEG did not show any seizure activity.Plan is to continue [place]. Continue seizure precaution.  Metastatic adenocarcinoma of the colon: Metastatic disease to the liver and increased LFT.  Last chemotherapy was on July 8.  As per last  oncology note, she is no longer a candidate for chemotherapy and they recommended hospice care.  Palliative care on board.She follows with oncology at [name]  Decreased urinary output: Since yesterday.Will start on gentle IV fluids.Kidney function ok.She is not retaining but feels slight lower  abdominal fullness. Will do intermittent in/out.  Sepsis considered on admission: Pneumonia suspected on admission.  She was never hypoxic.  CT chest showed bilateral pleural effusion, atelectasis no infiltrate, widespread metastatic disease.  Blood cultures no growth till date.  Respiratory viral panel negative.  Urine culture did not show any growth.  Peritoneal fluid did not show any growth.  She is afebrile and hemodynamically stable.  Antibiotics discontinued.  Cirrhosis: Secondary to metastatic disease with associated hyperbilirubinemia, LFTs, thrombocytopenia and ascites.  Continue supportive care.  She was on hydrochlorthiazide which has been discontinued due to hyponatremia.  Hyponatremia: Improvement.  Continue to monitor.  Thrombocytopenia: Most likely associated with chemotherapy and cirrhosis.  We will continue to monitor.  Anemia of chronic disease: Likely associated with chemotherapy.  We will continue to monitor CBC.  Dysphagia: Speech therapy recommended dysphagia 1, thin liquid.  Social/ethics/poor prognosis: Patient has a 96-year-old son and he is unable to make decision.  Currently patient's sister is assisting on making the decision. Palliative Care following regarding goals of care, CODE STATUS discussion and possible transition to hospice.  Currently she is full code.  Patient also has indeterminate left breast mass.     DVT prophylaxis: SCD Code Status: Full Family Communication: None present at the bedside Disposition Plan: Needs to be determined, most likely hospice at home   Consultants: palliative care, neurology  Procedures: EEG  Antimicrobials:  None  Subjective: Patient seen and  examined the bedside this morning.  She is alert and oriented times 4 today.  She is hemodynamically stable.  She looks comfortable and not in any kind of distress.  We discussed about her understanding of her illness. She understands that she is no longer a candidate for chemotherapy and her disease which is incurable and unfortunately it has progressed. She agrees with plan for hospice at home.   Objective: Vitals:   03/24/18 2139 03/25/18 0017 03/25/18 0416 03/25/18 0737  BP: 105/79 123/80 109/76 110/79  Pulse: (!) 113 97 100   Resp:  (!) 24    Temp:  97.7 F (36.5 C) 97.9 F (36.6 C) 98.2 F (36.8 C)  TempSrc:  Oral Oral Oral  SpO2:  97% 94%   Weight:      Height:        Intake/Output Summary (Last 24 hours) at 03/25/2018 0823 Last data filed at 03/24/2018 2153 Gross per 24 hour  Intake 920 ml  Output -  Net 920 ml   Filed Weights   03/20/18 1803 03/22/18 2044  Weight: 88.9 kg (196 lb) 89.5 kg (197 lb 5 oz)    Examination:  General exam: Appears calm and comfortable ,Not in distress,average built HEENT:PERRL,Oral mucosa moist, Ear/Nose normal on gross exam Respiratory system: Bilateral decreased air entry on the bases otherwise clear cardiovascular system: S1 & S2 heard, RRR. No JVD, murmurs, rubs, gallops or clicks.+1 pedal edema Gastrointestinal system: Abdomen is mildly  distended, soft and nontender. Normal bowel sounds heard.Slighly lower abdominal tenderness Central nervous system: Alert and oriented times 4. No focal neurological deficits. Extremities: 1+  edema, no clubbing ,no cyanosis, distal peripheral pulses palpable. Skin: No rashes, lesions or ulcers,no icterus ,no pallor     Data Reviewed: I have personally reviewed following labs and imaging studies  CBC: Recent Labs  Lab 03/20/18 1710 03/22/18 0447 03/24/18 0317  WBC 7.7 9.6 9.1  NEUTROABS 6.7  --   --   HGB 10.6* 8.6* 8.9*  HCT 31.7* 25.6* 27.6*  MCV  91.1 94.1 94.5  PLT 91* 59* 59*   Basic Metabolic Panel: Recent Labs  Lab 03/20/18 1710 03/22/18 0447 03/22/18 1634 03/24/18 0317 03/25/18 0630  NA 129* 126* 129* 129* 133*  K 4.2 3.4* 3.6 3.4* 4.1  CL 92* 95* 93* 97* 98  CO2 23 20* 19* 20* 23  GLUCOSE 118* 97 110* 126* 88  BUN 20 14 15 11 11   CREATININE 0.73 0.51 0.50 0.54 0.58  CALCIUM 8.8* 8.0* 8.7* 8.6* 8.5*   GFR: Estimated Creatinine Clearance: 96.2 mL/min (by C-G formula based on SCr of 0.58 mg/dL). Liver Function Tests: Recent Labs  Lab 03/20/18 1710 03/22/18 1634 03/24/18 0317  AST 223* 140* 113*  ALT 251* 160* 134*  ALKPHOS 588* 513* 533*  BILITOT 5.6* 3.9* 2.7*  PROT 7.7 7.5 6.6  ALBUMIN 3.1* 2.8* 2.3*   No results for input(s): LIPASE, AMYLASE in the last 168 hours. Recent Labs  Lab 03/20/18 2039 03/22/18 1627  AMMONIA 47* 24   Coagulation Profile: No results for input(s): INR, PROTIME in the last 168 hours. Cardiac Enzymes: Recent Labs  Lab 03/20/18 2202 03/21/18 0454 03/21/18 1001  TROPONINI 0.04* 0.04* 0.04*   BNP (last 3 results) No results for input(s): PROBNP in the last 8760 hours. HbA1C: No results for input(s): HGBA1C in the last 72 hours. CBG: Recent Labs  Lab 03/20/18 1738  GLUCAP 105*   Lipid Profile: No results for input(s): CHOL, HDL, LDLCALC, TRIG, CHOLHDL,  LDLDIRECT in the last 72 hours. Thyroid Function Tests: Recent Labs    03/22/18 1634  TSH 3.567   Anemia Panel: Recent Labs    03/22/18 1634  VITAMINB12 1,059*   Sepsis Labs: Recent Labs  Lab 03/20/18 1816 03/21/18 0103 03/21/18 0454  LATICACIDVEN 3.39* 1.8 1.9    Recent Results (from the past 240 hour(s))  Culture, blood (routine x 2)     Status: None (Preliminary result)   Collection Time: 03/20/18  5:49 PM  Result Value Ref Range Status   Specimen Description   Final    BLOOD RIGHT ANTECUBITAL Performed at [facility], [place] [address], [place], [place] [address]    Special  Requests   Final    BOTTLES DRAWN AEROBIC AND ANAEROBIC Blood Culture adequate volume Performed at [place] [address] [place], [place] [address]    Culture   Final    NO GROWTH 4 DAYS Performed at [facility], [place] [address], [place], [place] [address]    Report Status PENDING  Incomplete  Culture, blood (routine x 2)     Status: None (Preliminary result)   Collection Time: 03/20/18  5:59 PM  Result Value Ref Range Status   Specimen Description   Final    BLOOD RIGHT ANTECUBITAL Performed at [place] [address] [place], [place] [address]    Special Requests   Final    BOTTLES DRAWN AEROBIC AND ANAEROBIC Blood Culture adequate volume Performed at [place] [address], [place], [place] [address]    Culture   Final    NO GROWTH 4 DAYS Performed at [facility], [place] [address] [place], [place] [address]    Report Status PENDING  Incomplete  Respiratory Panel by PCR     Status: None   Collection Time: 03/21/18  7:50 AM  Result Value Ref Range Status   Adenovirus NOT DETECTED NOT DETECTED Final   Coronavirus 229E NOT DETECTED NOT DETECTED Final   Coronavirus HKU1 NOT DETECTED NOT DETECTED Final   Coronavirus NL63 NOT DETECTED NOT DETECTED Final   Coronavirus OC43 NOT DETECTED NOT DETECTED Final   Metapneumovirus NOT DETECTED NOT DETECTED Final   Rhinovirus / Enterovirus NOT DETECTED NOT DETECTED Final   Influenza A NOT DETECTED NOT DETECTED Final   Influenza B NOT DETECTED NOT DETECTED Final   Parainfluenza Virus 1 NOT DETECTED NOT DETECTED Final   Parainfluenza Virus 2 NOT DETECTED NOT DETECTED Final   Parainfluenza Virus 3 NOT DETECTED NOT DETECTED Final   Parainfluenza Virus 4 NOT DETECTED NOT DETECTED Final   Respiratory Syncytial Virus NOT DETECTED NOT DETECTED Final   Bordetella pertussis NOT DETECTED NOT DETECTED Final   Chlamydophila pneumoniae NOT DETECTED NOT DETECTED  Final   Mycoplasma pneumoniae NOT DETECTED NOT DETECTED Final    Comment: Performed at [facility], [place] [address], [place], [place] [address]  Culture, body fluid-bottle     Status: None (Preliminary result)   Collection Time: 03/21/18  1:42 PM  Result Value Ref Range Status   Specimen Description PERITONEAL  Final   Special Requests NONE  Final   Culture   Final    NO GROWTH 3 DAYS Performed at [facility], [address], [place],  [address]    Report Status PENDING  Incomplete  Gram stain     Status: None   Collection Time: 03/21/18  1:42 PM  Result  Value Ref Range Status   Specimen Description PERITONEAL  Final   Special Requests NONE  Final   Gram Stain   Final    RARE WBC PRESENT, PREDOMINANTLY PMN NO ORGANISMS SEEN Performed at [facility], [place] [address], [place], [place] [address]    Report Status 03/22/2018 FINAL  Final  Culture, Urine     Status: None   Collection Time: 03/21/18  4:39 PM  Result Value Ref Range Status   Specimen Description   Final    URINE, RANDOM Performed at [place] [address] [place], [place] [address]    Special Requests   Final    NONE Performed at [facility], [place] [address] [place], [place] [address]    Culture   Final    NO GROWTH Performed at [facility], [place] [address] [place], [place] [address]    Report Status 03/23/2018 FINAL  Final  MRSA PCR Screening     Status: None   Collection Time: 03/23/18  1:42 AM  Result Value Ref Range Status   MRSA by PCR NEGATIVE NEGATIVE Final    Comment:        The GeneXpert MRSA Assay (FDA approved for NASAL specimens only), is one component of a comprehensive MRSA colonization surveillance program. It is not intended to diagnose MRSA infection nor to guide or monitor treatment for MRSA infections. Performed at [facility], [place] [address], [place], [place] [address]          Radiology  Studies: No results found.      Scheduled Meds: . citalopram  20 mg Oral Daily  . Ferrous Fumarate  1 tablet Oral BID AC  . lactulose  10 g Oral Daily  . metoprolol tartrate  50 mg Oral BID  . potassium chloride  20 mEq Oral Q breakfast  . sodium chloride flush  10-40 mL Intracatheter Q12H   Continuous Infusions: . sodium chloride    . levETIRAcetam 500 mg (03/24/18 2138)     LOS: 5 days    Time spent: 35 mins.More than 50% of that time was spent in counseling and/or coordination of care.      Shelly Coss, MD [facility] Pager [phone]  If 7PM-7AM, please contact night-coverage [email or web address] Password [facility] 03/25/2018, 8:23 AM

## 2018-03-25 NOTE — Progress Notes (Addendum)
Daily Progress Note   Patient Name: Brittney Young       Date: 03/25/2018 DOB: 1971-03-18  Age: 47 y.o. MRN#: [id number] Attending Physician: Shelly Coss, MD Primary Care Physician: Lavada Mesi Admit Date: 03/20/2018  Reason for Consultation/Follow-up: Establishing goals of care, Hospice Evaluation, Non pain symptom management, Pain control and Psychosocial/spiritual support  Subjective: Discussed case with Dr. Tawanna Solo this am.  It appears Brittney Young is alert and orientated today.  Bladder scans indicate that she is making little urine.    PT eval was completed.  Patient was incontinent of stool on standing.  Her heart rate rose to 125 with standing for 3 min.  Ambulation was not attempted due to weakness.   I met with the patient before our family meeting.  She tells me she is sad.  I ask "What's on your mind?"  She replies, "I don't want to die."   We briefly discuss the upcoming meeting with her family, her son Lanny Hurst), and disposition with hospice services.   Her family arrived including [name] her sister and Darius her nephew (who works at [employer]).  We talked thru Brittney Young's course over the past year.  It was clear that her cancer has grown quite aggressively despite all measures to slow it.  Chemo has been unusually hard on Brittney Young's body.  It is my feeling that she will live better, longer without it.  It is also quite clear that Brittney Young has fought for her life every step of the way and she continues to do so.   We talked about symptom management - currently Brittney Young complains of nausea and lack of appetite.  She has abdominal pressure but not pain.   We talked about zofran, phenergan, ativan, and morphine.  We discussed Hospice at home vs [facility].  After some  deliberation and tears Brittney Young chose hospice at home.  Her sister [name] is supportive of this plan.  [name] inquired about getting additional support in the house.  She will need to make inquiries to line people up as the family members work.  She will also need to re-arrange her living space and bring a hospital bed into the house.  We talked about [facility] for Lanny Hurst.  [name] is not able to face dying because of her son.  Finally we discussed code  status.  After explaining code status in the context of her current health - Brittney Young still asked to remain a full code.  I believe she wants to spend every minute of life possible with her son.   Patient Profile/HPI:  47 y.o. female  with past medical history of metastatic colon cancer (diagnosed 04/2017) with mets to the liver and lung, left breast mass, HTN, depression and anxiety who was admitted on 03/20/2018 with encephalopathy x 3 days.  She had also been having bilateral LE weakness with pain in her right knee.  Work up revealed right lung opacity c/w PNA,  slightly elevated ammonia, small left cerebellar stroke and likely seizure activity.  Peritoneal fluid is significant for elevated lactate dehydrogenase (3055).  Imaging thus far is negative for mets to the brain.    Length of Stay: 5  Current Medications: Scheduled Meds:  . citalopram  20 mg Oral Daily  . Ferrous Fumarate  1 tablet Oral BID AC  . lactulose  10 g Oral Daily  . metoprolol tartrate  50 mg Oral BID  . potassium chloride  20 mEq Oral Q breakfast  . sodium chloride flush  10-40 mL Intracatheter Q12H    Continuous Infusions: . sodium chloride    . levETIRAcetam 500 mg (03/24/18 2138)    PRN Meds: HYDROcodone-acetaminophen, LORazepam, ondansetron (ZOFRAN) IV, phenol, sodium chloride flush  Physical Exam       Well developed female A&O x 4 cv rrr resp no distress Abdomen distended   Vital Signs: BP 110/79 (BP Location: Right Arm)   Pulse 100   Temp 98.2 F (36.8 C)  (Oral)   Resp (!) 24   Ht _0  (1.626 m)   Wt 89.5 kg (197 lb 5 oz)   LMP  (LMP Unknown)   SpO2 94%   BMI 33.87 kg/m  SpO2: SpO2: 94 % O2 Device: O2 Device: Room Air O2 Flow Rate:    Intake/output summary:   Intake/Output Summary (Last 24 hours) at 03/25/2018 4970 Last data filed at 03/24/2018 2153 Gross per 24 hour  Intake 920 ml  Output -  Net 920 ml   LBM: Last BM Date: 03/24/18 Baseline Weight: Weight: 88.9 kg (196 lb) Most recent weight: Weight: 89.5 kg (197 lb 5 oz)       Palliative Assessment/Data: 30%    Flowsheet Rows     Most Recent Value  Intake Tab  Referral Department  Hospitalist  Unit at Time of Referral  Other (Comment) [telementry/urology]  Palliative Care Primary Diagnosis  Cancer  Date Notified  03/22/18  Palliative Care Type  New Palliative care  Reason for referral  Clarify Goals of Care  Date of Admission  03/20/18  Date first seen by Palliative Care  03/23/18  # of days Palliative referral response time  1 Day(s)  # of days IP prior to Palliative referral  2  Clinical Assessment  Psychosocial & Spiritual Assessment  Palliative Care Outcomes      Patient Active Problem List   Diagnosis Date Noted  . Acute metabolic encephalopathy 26/37/8588  . Cirrhosis ([place]) 03/23/2018  . Thrombocytopenia ([place]) 03/23/2018  . Abnormal liver function   . Seizures ([place])   . Bilateral leg weakness 03/11/2018  . Metastatic colon cancer in female [facility]) 05/05/2017  . Dehydration 10/03/2016  . Severe episode of recurrent major depressive disorder, without psychotic features ([place]) 10/03/2016  . TMJ (temporomandibular joint syndrome) 05/07/2016  . Cephalalgia 05/07/2016  . Knee pain, left 09/04/2015  .  Postlaminectomy syndrome of lumbar region 04/02/2015  . Anxiety and depression 12/18/2014  . Anemia 07/03/2014  . Vitamin D deficiency 07/03/2014  . Other fatigue 07/03/2014  . Essential hypertension, benign 06/30/2014  . Genital herpes 06/30/2014     Palliative Care Plan    Recommendations/Plan:  Recommend d/c prescriptions for SL zofran, ativan, morphine.  I will order these PRN inpatient.  Will start daily dexamethasone tablet 4 mg q am for appetite, nausea, fatigue.  Continue oral keppra at dc  D/C home with Hospice Services on 7/19.  Family will need a day for Hospice bed to be delivered to the house  Family searching for additional support and care givers.  [facility] for 47 yo Lanny Hurst.  Goals of Care and Additional Recommendations:  Limitations on Scope of Treatment: No Chemotherapy and No Radiation  Code Status:  Full code  Prognosis:   days to weeks.  In the setting of aggressive widely metastatic cancer with immobility, minimal PO intake, new onset seizures.  No further oncology treatment.  Choosing Hospice support.   Discharge Planning:  Home with Hospice  Care plan was discussed with family, case management, attending MD  Thank you for allowing the Palliative Medicine Team to assist in the care of this patient.  Total time spent:  60 min     Greater than 50%  of this time was spent counseling and coordinating care related to the above assessment and plan.  Florentina Jenny, PA-C Palliative Medicine  Please contact Palliative MedicineTeam phone at [phone] for questions and concerns between 7 am - 7 pm.   Please see AMION for individual provider pager numbers.

## 2018-03-25 NOTE — Progress Notes (Signed)
Pt requesting numbing cream prior to [facility] access. Offered to try ice pack instead due to pt does not have cream from home or order to use here. Pt agreed. RN notified and ice pack requested.

## 2018-03-25 NOTE — Progress Notes (Signed)
Patient noted to have confusion this afternoon.  Talking about "when I go to the hospital" when asked what hospital she replies "[facility]".   Family concerned.    Patient also complaining of nausea.  Will schedule zofran AC and HS.   May consider Zyprexa if nausea remains uncontrolled.   Patient remains on IV keppra.  PMT will continue to follow closely.  Florentina Jenny, PA-C Palliative Medicine Pager: [phone]   No charge note.

## 2018-03-25 NOTE — Progress Notes (Addendum)
[facility] Liaison: RN visit   Notified by Neoma Laming Taylor,CMRN of patient/family request for [facility] services at home after discharge. Chart and patient information reviewed by [facility] physician. Hospice eligibility confirmed.   Writer spoke with patient , sister and  nephew at bedside to initiate education related to hospice philosophy, services and team approach to care. Family and patient verbalized understanding of information given. Per discussion, plan is for discharge to home by private vehicle 03/26/2018.  Patient will need prescriptions for discharge comfort medications.   DME needs have been discussed, patient currently has the following equipment in the home:  Walker and cane.  Patient/family requests the following DME for delivery to the home: Hospital bed, w/c, shower chair and 3N1 . [facility] equipment manager has been notified and will contact [place] to arrange delivery to the home. Home address is sister's home [address] [place] [address]. [name] is the family member to contact to arrange time of delivery.   [facility] aware of the above. Please notify [facility] when patient is ready to leave the unit at discharge. (Call [phone] or [phone] after 5pm.) [facility] information and contact numbers given to patient and family at time of visit. Above information shared with  Neoma Laming, [facility].   Please call with any hospice related questions.   Thank you for this referral.   Farrel Gordon, RN, [facility] Liaison [phone] ? [facility] liaisons are on [place].

## 2018-03-25 NOTE — Progress Notes (Signed)
PT Cancellation Note  Patient Details Name: Brittney Young MRN: [id number] DOB: 20-Oct-1970   Cancelled Treatment:    Reason Eval/Treat Not Completed: Other (comment). Meeting with palliative care this AM. Will follow-up for PT treatment as schedule permits.  Mabeline Caras, PT, DPT Acute Rehab Services  Pager: [place] 03/25/2018, 9:34 AM

## 2018-03-25 NOTE — Progress Notes (Signed)
Pt has not voided since straight cath during the day-shift noted to be around 0600 on 03/24/18 by day shift nurse. Per day shift nurse, the pt was bladder scanned around 1500 on 03/24/18 with 250 mls being seen. Overnight, the pt has continued to not void. A bladder scan was completed at 2331 03/24/18 which showed 258 mL. On call NP Schorr was notified and an order was placed to In and Out cath the pt once the bladder scan was >350. The pt continued to not void and a second bladder scan was preformed at 0430 on 03/25/18 which only showed 215 mL. On call NP Schorr was notified again about the patients bladder scan results. No new orders have been placed and no new instructions have been give. Will continue to monitor pt and will pass info along to day shift.

## 2018-03-26 ENCOUNTER — Inpatient Hospital Stay (HOSPITAL_COMMUNITY): Payer: BLUE CROSS/BLUE SHIELD

## 2018-03-26 LAB — CULTURE, BODY FLUID W GRAM STAIN -BOTTLE

## 2018-03-26 LAB — CULTURE, BODY FLUID-BOTTLE: CULTURE: NO GROWTH

## 2018-03-26 MED ORDER — CLOTRIMAZOLE 1 % VA CREA
1.0000 | TOPICAL_CREAM | Freq: Every day | VAGINAL | Status: DC
  Administered 2018-03-26 – 2018-03-28 (×3): 1 via VAGINAL
  Filled 2018-03-26: qty 45

## 2018-03-26 MED ORDER — LORAZEPAM 2 MG/ML PO CONC
0.2600 mg | Freq: Two times a day (BID) | ORAL | Status: DC
  Administered 2018-03-26 – 2018-03-28 (×5): 0.26 mg via ORAL
  Filled 2018-03-26 (×5): qty 1

## 2018-03-26 MED ORDER — POTASSIUM CHLORIDE CRYS ER 20 MEQ PO TBCR
20.0000 meq | EXTENDED_RELEASE_TABLET | Freq: Every day | ORAL | Status: DC
  Administered 2018-03-26 – 2018-03-29 (×4): 20 meq via ORAL
  Filled 2018-03-26 (×4): qty 1

## 2018-03-26 MED ORDER — LIDOCAINE HCL (PF) 2 % IJ SOLN
INTRAMUSCULAR | Status: AC
  Filled 2018-03-26: qty 20

## 2018-03-26 MED ORDER — HYDROCHLOROTHIAZIDE 25 MG PO TABS
25.0000 mg | ORAL_TABLET | Freq: Every day | ORAL | Status: DC
  Administered 2018-03-26: 25 mg via ORAL
  Filled 2018-03-26: qty 1

## 2018-03-26 NOTE — Progress Notes (Signed)
PROGRESS NOTE    Brittney Young  RDE:[id number] DOB: [birth year] DOA: 03/20/2018 PCP: Donella Stade, PA-C   Brief Narrative:  Patient 47 year old female with history of metastatic cecal adenocarcinoma, status post chemotherapy presented with acute encephalopathy.  She is being managed for encephalopathy, pneumonia, sepsis and hyponatremia.  She underwent treatment for pneumonia with empiric antibiotics.  Also underwent diagnostic paracentesis.  During the hospital stay, she also developed seizure activity.  Neurology was consulted.  Neurology recommended long-term video monitoring EEG.  There was also concern about metastatic disease contributing to her change in mental status.  Palliative care is assisting on transition of her care to hospice.The goal is to set up Hospice service at home.Hopefully that can happen soon.  Assessment & Plan:   Active Problems:   Metastatic colon cancer in female [facility])   Abnormal liver function   Seizures ([facility])   Acute metabolic encephalopathy   Cirrhosis ([place])   Thrombocytopenia ([facility])  Acute metabolic encephalopathy: Presented with altered mental status.  Initially thought to be secondary to metastatic liver disease but her serum ammonia was normal.  MRI of the brain did not show any metastatic lesion or abnormal meningeal enhancement.  There was concern for left cerebellar infarct but as per neurology appears most consistent with artifact.  Infectious work-up negative.  Most likely her change in  mental status is secondary to advanced liver disease secondary to metastatic adenocarcinoma of the colon vs post ictal phage vs toxic metabolic encephalopathy.Neurology was following .She is  on Keppra.LTM EEG did not show any seizure activity.Plan is to continue [place]. Continue seizure precaution.  Metastatic adenocarcinoma of the colon: Metastatic disease to the liver and increased LFT.  Last chemotherapy was on July 8.  As per last oncology note, she is no  longer a candidate for chemotherapy and they recommended hospice care.  Palliative care on board.She follows with oncology at [facility]  Decreased urinary output: Much improved. Renal function normal.  Sepsis considered on admission: Pneumonia suspected on admission.  She was never hypoxic.  CT chest showed bilateral pleural effusion, atelectasis no infiltrate, widespread metastatic disease.  Blood cultures no growth till date.  Respiratory viral panel negative.  Urine culture did not show any growth.  Peritoneal fluid did not show any growth.  She is afebrile and hemodynamically stable.  Antibiotics discontinued.  Cirrhosis: Secondary to metastatic disease with associated hyperbilirubinemia, LFTs, thrombocytopenia and ascites.  Continue supportive care.  She was on hydrochlorthiazide which has been discontinued due to hyponatremia.  Hyponatremia: Improvement.  Continue to monitor.  Thrombocytopenia: Most likely associated with chemotherapy and cirrhosis.  We will continue to monitor.  Anemia of chronic disease: Likely associated with chemotherapy.  We will continue to monitor CBC.  Dysphagia: Speech therapy recommended dysphagia 1, thin liquid.  Social/ethics/poor prognosis: Patient has a 58-year-old son and he is unable to make decision.  Currently patient's sister is assisting on making the decision. Palliative Care following regarding goals of care, CODE STATUS discussion and and  transition to hospice.  Currently she is full code.  Patient also has indeterminate left breast mass.     DVT prophylaxis: SCD Code Status: Full Family Communication: None present at the bedside Disposition Plan:hospice at home as soon as arrangement done.  Sister is busy this weekend so she cannot take her home.Likely Monday?   Consultants: palliative care, neurology  Procedures: EEG  Antimicrobials: None  Subjective: Patient seen and examined the bedside this morning.  She is alert  and  oriented times 4 today.  She is hemodynamically stable.  She looks comfortable and not in any kind of distress.  Complains of right lower extremity numbness today.  Objective: Vitals:   03/25/18 1658 03/25/18 1923 03/25/18 2313 03/26/18 0342  BP: 109/76 112/73 104/73 115/82  Pulse: (!) 103 (!) 103 95 88  Resp: 20     Temp: 98.4 F (36.9 C) 98.2 F (36.8 C) 98.2 F (36.8 C) 97.9 F (36.6 C)  TempSrc: Oral Oral Oral Oral  SpO2: 100% 98% 94% 95%  Weight:      Height:        Intake/Output Summary (Last 24 hours) at 03/26/2018 0820 Last data filed at 03/25/2018 1800 Gross per 24 hour  Intake -  Output 600 ml  Net -600 ml   Filed Weights   03/20/18 1803 03/22/18 2044  Weight: 88.9 kg (196 lb) 89.5 kg (197 lb 5 oz)    Examination:  General exam: Appears calm and comfortable ,Not in distress,average built HEENT:PERRL,Oral mucosa moist, Ear/Nose normal on gross exam Respiratory system: Bilateral decreased air entry on the bases otherwise clear cardiovascular system: S1 & S2 heard, RRR. No JVD, murmurs, rubs, gallops or clicks.+1 pedal edema Gastrointestinal system: Abdomen is mildly  distended, soft and nontender. Normal bowel sounds heard.Slighly lower abdominal tenderness Central nervous system: Alert and oriented times 4. No focal neurological deficits. Extremities: 1+  edema, no clubbing ,no cyanosis, distal peripheral pulses palpable. Skin: No rashes, lesions or ulcers,no icterus ,no pallor     Data Reviewed: I have personally reviewed following labs and imaging studies  CBC: Recent Labs  Lab 03/20/18 1710 03/22/18 0447 03/24/18 0317  WBC 7.7 9.6 9.1  NEUTROABS 6.7  --   --   HGB 10.6* 8.6* 8.9*  HCT 31.7* 25.6* 27.6*  MCV 91.1 94.1 94.5  PLT 91* 59* 59*   Basic Metabolic Panel: Recent Labs  Lab 03/20/18 1710 03/22/18 0447 03/22/18 1634 03/24/18 0317 03/25/18 0630  NA 129* 126* 129* 129* 133*  K 4.2 3.4* 3.6 3.4* 4.1  CL 92* 95* 93* 97* 98  CO2 23  20* 19* 20* 23  GLUCOSE 118* 97 110* 126* 88  BUN 20 14 15 11 11   CREATININE 0.73 0.51 0.50 0.54 0.58  CALCIUM 8.8* 8.0* 8.7* 8.6* 8.5*   GFR: Estimated Creatinine Clearance: 96.2 mL/min (by C-G formula based on SCr of 0.58 mg/dL). Liver Function Tests: Recent Labs  Lab 03/20/18 1710 03/22/18 1634 03/24/18 0317  AST 223* 140* 113*  ALT 251* 160* 134*  ALKPHOS 588* 513* 533*  BILITOT 5.6* 3.9* 2.7*  PROT 7.7 7.5 6.6  ALBUMIN 3.1* 2.8* 2.3*   No results for input(s): LIPASE, AMYLASE in the last 168 hours. Recent Labs  Lab 03/20/18 2039 03/22/18 1627  AMMONIA 47* 24   Coagulation Profile: No results for input(s): INR, PROTIME in the last 168 hours. Cardiac Enzymes: Recent Labs  Lab 03/20/18 2202 03/21/18 0454 03/21/18 1001  TROPONINI 0.04* 0.04* 0.04*   BNP (last 3 results) No results for input(s): PROBNP in the last 8760 hours. HbA1C: No results for input(s): HGBA1C in the last 72 hours. CBG: Recent Labs  Lab 03/20/18 1738  GLUCAP 105*   Lipid Profile: No results for input(s): CHOL, HDL, LDLCALC, TRIG, CHOLHDL, LDLDIRECT in the last 72 hours. Thyroid Function Tests: No results for input(s): TSH, T4TOTAL, FREET4, T3FREE, THYROIDAB in the last 72 hours. Anemia Panel: No results for input(s): VITAMINB12, FOLATE, FERRITIN, TIBC, IRON, RETICCTPCT in the last 72  hours. Sepsis Labs: Recent Labs  Lab 03/20/18 1816 03/21/18 0103 03/21/18 0454  LATICACIDVEN 3.39* 1.8 1.9    Recent Results (from the past 240 hour(s))  Culture, blood (routine x 2)     Status: None   Collection Time: 03/20/18  5:49 PM  Result Value Ref Range Status   Specimen Description   Final    BLOOD RIGHT ANTECUBITAL Performed at [facility], [place] [address], [place], [place] [address]    Special Requests   Final    BOTTLES DRAWN AEROBIC AND ANAEROBIC Blood Culture adequate volume Performed at [facility] [address] [place], [place]  [address]    Culture   Final    NO GROWTH 5 DAYS Performed at [facility], [place] [address], [place], [place] [address]    Report Status 03/25/2018 FINAL  Final  Culture, blood (routine x 2)     Status: None   Collection Time: 03/20/18  5:59 PM  Result Value Ref Range Status   Specimen Description   Final    BLOOD RIGHT ANTECUBITAL Performed at [facility] [address] [place], [place] [address]    Special Requests   Final    BOTTLES DRAWN AEROBIC AND ANAEROBIC Blood Culture adequate volume Performed at [facility] [address], [place], [place] [address]    Culture   Final    NO GROWTH 5 DAYS Performed at [facility], [place] [address] [place], [place] [address]    Report Status 03/25/2018 FINAL  Final  Respiratory Panel by PCR     Status: None   Collection Time: 03/21/18  7:50 AM  Result Value Ref Range Status   Adenovirus NOT DETECTED NOT DETECTED Final   Coronavirus 229E NOT DETECTED NOT DETECTED Final   Coronavirus HKU1 NOT DETECTED NOT DETECTED Final   Coronavirus NL63 NOT DETECTED NOT DETECTED Final   Coronavirus OC43 NOT DETECTED NOT DETECTED Final   Metapneumovirus NOT DETECTED NOT DETECTED Final   Rhinovirus / Enterovirus NOT DETECTED NOT DETECTED Final   Influenza A NOT DETECTED NOT DETECTED Final   Influenza B NOT DETECTED NOT DETECTED Final   Parainfluenza Virus 1 NOT DETECTED NOT DETECTED Final   Parainfluenza Virus 2 NOT DETECTED NOT DETECTED Final   Parainfluenza Virus 3 NOT DETECTED NOT DETECTED Final   Parainfluenza Virus 4 NOT DETECTED NOT DETECTED Final   Respiratory Syncytial Virus NOT DETECTED NOT DETECTED Final   Bordetella pertussis NOT DETECTED NOT DETECTED Final   Chlamydophila pneumoniae NOT DETECTED NOT DETECTED Final   Mycoplasma pneumoniae NOT DETECTED NOT DETECTED Final    Comment: Performed at [facility], [place] [address], [place], [place] [address]  Culture, body fluid-bottle      Status: None (Preliminary result)   Collection Time: 03/21/18  1:42 PM  Result Value Ref Range Status   Specimen Description PERITONEAL  Final   Special Requests NONE  Final   Culture   Final    NO GROWTH 4 DAYS Performed at [facility], [address], [place], [place] [address]    Report Status PENDING  Incomplete  Gram stain     Status: None   Collection Time: 03/21/18  1:42 PM  Result Value Ref Range Status   Specimen Description PERITONEAL  Final   Special Requests NONE  Final   Gram Stain   Final    RARE WBC PRESENT, PREDOMINANTLY PMN NO ORGANISMS SEEN  Performed at [facility], [place] [address], [place], [place] [address]    Report Status 03/22/2018 FINAL  Final  Culture, Urine     Status: None   Collection Time: 03/21/18  4:39 PM  Result Value Ref Range Status   Specimen Description   Final    URINE, RANDOM Performed at [facility] [address] [place], [place] [address]    Special Requests   Final    NONE Performed at [facility], [place] [address], [place], [place] [address]    Culture   Final    NO GROWTH Performed at [facility], [place] [address] [place], [place] [address]    Report Status 03/23/2018 FINAL  Final  MRSA PCR Screening     Status: None   Collection Time: 03/23/18  1:42 AM  Result Value Ref Range Status   MRSA by PCR NEGATIVE NEGATIVE Final    Comment:        The GeneXpert MRSA Assay (FDA approved for NASAL specimens only), is one component of a comprehensive MRSA colonization surveillance program. It is not intended to diagnose MRSA infection nor to guide or monitor treatment for MRSA infections. Performed at [facility], [place] [address] [place], [place] [address]          Radiology Studies: No results found.      Scheduled Meds: . citalopram  20 mg Oral Daily  . dexamethasone  4 mg Oral Daily  . metoprolol tartrate  50 mg Oral BID  . ondansetron (ZOFRAN) IV   4 mg Intravenous TID AC & HS  . potassium chloride  20 mEq Oral Q breakfast  . sodium chloride flush  10-40 mL Intracatheter Q12H   Continuous Infusions: . levETIRAcetam 500 mg (03/25/18 2022)     LOS: 6 days    Time spent: 35 mins.More than 50% of that time was spent in counseling and/or coordination of care.      Shelly Coss, MD [facility] Pager [phone]  If 7PM-7AM, please contact night-coverage [email or web address] Password [email or web address] 03/26/2018, 8:20 AM

## 2018-03-26 NOTE — Plan of Care (Signed)
Discussed plan of care with patient.  Patient was much more communicative today.  She stated she wants to remain in our care until Monday because her sister is not available this weekend.  Some teach back displayed.

## 2018-03-26 NOTE — Progress Notes (Signed)
Daily Progress Note   Patient Name: Brittney Young       Date: 03/26/2018 DOB: 07-30-1971  Age: 47 y.o. MRN#: [id number] Attending Physician: Shelly Coss, MD Primary Care Physician: Lavada Mesi Admit Date: 03/20/2018    Brittney Young is currently admitted with metastatic cecal adenocardinoma, which has been complicated by seizures, encephalopathy, immobility with bilateral lower extremity hemi-paresis, pneumonia, sepsis, and hyponatremia. While the pneumonia has improved, her agitation is ongoing and necessitated the addition of scheduled Ativan today. Her ongoing nausea requires Zofran IV ACHS and prn. Due to recent seizing, she is also on IV Keppra for suppression. Voiding remains a problem, and nursing is catheterizing her several times a day. She is also complaining of chest tightness with a CT ([facility]) showing some ascites; she will get further work up to see if she needs paracentesis before discharge.  Brittney Young has a 92 year old child, and she and her family are not ready to move to DNR status. Her family is under tremendous pressure right now due to this advanced, terminal illness in a young person, as well as having to assume care for her son. Complicating matters is a family death, for which the funeral is tomorrow, 7/20. Due to the family's responsibilities r/t that death, they are unable to get home to rearrange the house to accept equipment until Sunday or Monday. Brittney Young cannot be safely discharged until this is in place. Due to current uncontrolled symptoms and high caregiver burden this weekend, it is likely that discharging her before a safe plan is in place will result in immediate return to the ED. Currently Brittney Young does not qualify for residential hospice and she cannot go while she  is a full code.  The best discharge plan is to exercise compassion and allow the family time to get things in order so they are empowered to care for her at home with hospice when they can accept the equipment in 48-72 hours.        Reason for Consultation/Follow-up: Establishing goals of care  Subjective: Patient reports increased anxiety, insomnia, new vaginal yeast infection.   She is happy that she urinated last night for the 1st time without an in and out cath as these have been painful for her.  She requested that her HCTZ be restarted as she has  chest pressure and it reduces her chest pressure.   Assessment: 47 yo female with widely metastatic colon cancer to her liver, lungs, pancreas, with recent spread creating peritoneal carcinomatosis.  Complaining of chest pressure.  Recent ascites on CT abdomen pelvis (done at [facility]).     Patient Profile/HPI:  47 y.o. female  with past medical history of metastatic colon cancer (diagnosed 04/2017) with mets to the liver and lung, left breast mass, HTN, depression and anxiety who was admitted on 03/20/2018 with encephalopathy x 3 days.  She had also been having bilateral LE weakness with pain in her right knee.  Work up revealed right lung opacity c/w PNA,  slightly elevated ammonia, small left cerebellar stroke and likely seizure activity.  Peritoneal fluid is significant for elevated lactate dehydrogenase (3055).  Imaging thus far is negative for mets to the brain.     Length of Stay: 6  Current Medications: Scheduled Meds:  . citalopram  20 mg Oral Daily  . clotrimazole  1 Applicatorful Vaginal QHS  . dexamethasone  4 mg Oral Daily  . hydrochlorothiazide  25 mg Oral Daily  . LORazepam  0.26 mg Oral BID  . metoprolol tartrate  50 mg Oral BID  . ondansetron (ZOFRAN) IV  4 mg Intravenous TID AC & HS  . potassium chloride  20 mEq Oral Daily  . sodium chloride flush  10-40 mL Intracatheter Q12H    Continuous Infusions: . levETIRAcetam  500 mg (03/25/18 2022)    PRN Meds: HYDROcodone-acetaminophen, LORazepam, LORazepam, morphine CONCENTRATE, ondansetron (ZOFRAN) IV, ondansetron, phenol, sodium chloride flush  Physical Exam        Well developed obese female, awake, alert.  Appears anxious CV tachy Abdomen distended  Vital Signs: BP (!) 111/100 (BP Location: Right Arm)   Pulse (!) 103   Temp 98.2 F (36.8 C) (Oral)   Resp 20   Ht 5\' 4"  (1.626 m)   Wt 89.5 kg (197 lb 5 oz)   LMP  (LMP Unknown)   SpO2 96%   BMI 33.87 kg/m  SpO2: SpO2: 96 % O2 Device: O2 Device: Room Air O2 Flow Rate:    Intake/output summary:   Intake/Output Summary (Last 24 hours) at 03/26/2018 1003 Last data filed at 03/25/2018 1800 Gross per 24 hour  Intake -  Output 600 ml  Net -600 ml   LBM: Last BM Date: 03/24/18 Baseline Weight: Weight: 88.9 kg (196 lb) Most recent weight: Weight: 89.5 kg (197 lb 5 oz)       Palliative Assessment/Data:  40%    Flowsheet Rows     Most Recent Value  Intake Tab  Referral Department  Hospitalist  Unit at Time of Referral  Other (Comment) [telementry/urology]  Palliative Care Primary Diagnosis  Cancer  Date Notified  03/22/18  Palliative Care Type  New Palliative care  Reason for referral  Clarify Goals of Care  Date of Admission  03/20/18  Date first seen by Palliative Care  03/23/18  # of days Palliative referral response time  1 Day(s)  # of days IP prior to Palliative referral  2  Clinical Assessment  Psychosocial & Spiritual Assessment  Palliative Care Outcomes      Patient Active Problem List   Diagnosis Date Noted  . Acute metabolic encephalopathy 25/95/6387  . Cirrhosis ([place]) 03/23/2018  . Thrombocytopenia ([place]) 03/23/2018  . Abnormal liver function   . Seizures ([place])   . Bilateral leg weakness 03/11/2018  . Metastatic colon cancer in female [facility]) 05/05/2017  .  Dehydration 10/03/2016  . Severe episode of recurrent major depressive disorder, without psychotic features  ([place]) 10/03/2016  . TMJ (temporomandibular joint syndrome) 05/07/2016  . Cephalalgia 05/07/2016  . Knee pain, left 09/04/2015  . Postlaminectomy syndrome of lumbar region 04/02/2015  . Anxiety and depression 12/18/2014  . Anemia 07/03/2014  . Vitamin D deficiency 07/03/2014  . Other fatigue 07/03/2014  . Essential hypertension, benign 06/30/2014  . Genital herpes 06/30/2014    Palliative Care Plan    Recommendations/Plan:  Will check abdominal ultrasound.  I believe her chest pressure may be coming from ascites.      Will restart HCTZ and potassium as patient requested  Will start scheduled ativan for anxiety and insomnia.  Will start treatment for vaginal yeast infection.  Family has had another death in the family - funeral 04/14/23.  They are unable to accept equipment delivery from Hospice until Sunday.   Code Status:  Full code  Prognosis:  days to weeks.  In the setting of aggressive widely metastatic cancer with immobility, minimal PO intake, new onset seizures.  No further oncology treatment.  Choosing Hospice support.     Discharge Planning:  Home with Hospice when able to be arranged.  Care plan was discussed with patient, family, case management, attending MD  Thank you for allowing the Palliative Medicine Team to assist in the care of this patient.  Total time spent:  35 min.     Greater than 50%  of this time was spent counseling and coordinating care related to the above assessment and plan.  Florentina Jenny, PA-C Palliative Medicine  Please contact Palliative MedicineTeam phone at [phone] for questions and concerns between 7 am - 7 pm.   Please see AMION for individual provider pager numbers.

## 2018-03-27 LAB — BASIC METABOLIC PANEL
ANION GAP: 10 (ref 5–15)
BUN: 9 mg/dL (ref 6–20)
CO2: 24 mmol/L (ref 22–32)
Calcium: 8.3 mg/dL — ABNORMAL LOW (ref 8.9–10.3)
Chloride: 100 mmol/L (ref 98–111)
Creatinine, Ser: 0.55 mg/dL (ref 0.44–1.00)
GFR calc Af Amer: >60 mL/min (ref >60)
GFR calc non Af Amer: >60 mL/min (ref >60)
Glucose, Bld: 104 mg/dL — ABNORMAL HIGH (ref 70–99)
POTASSIUM: 3.2 mmol/L — AB (ref 3.5–5.1)
SODIUM: 134 mmol/L — AB (ref 135–145)

## 2018-03-27 MED ORDER — GABAPENTIN 100 MG PO CAPS
100.0000 mg | ORAL_CAPSULE | Freq: Three times a day (TID) | ORAL | Status: DC
  Administered 2018-03-27 – 2018-03-29 (×8): 100 mg via ORAL
  Filled 2018-03-27 (×8): qty 1

## 2018-03-27 MED ORDER — FUROSEMIDE 20 MG PO TABS
20.0000 mg | ORAL_TABLET | Freq: Every day | ORAL | Status: DC
  Administered 2018-03-27 – 2018-03-29 (×3): 20 mg via ORAL
  Filled 2018-03-27 (×3): qty 1

## 2018-03-27 MED ORDER — POTASSIUM CHLORIDE CRYS ER 20 MEQ PO TBCR
20.0000 meq | EXTENDED_RELEASE_TABLET | Freq: Once | ORAL | Status: AC
  Administered 2018-03-27: 20 meq via ORAL
  Filled 2018-03-27: qty 1

## 2018-03-27 NOTE — Progress Notes (Addendum)
PROGRESS NOTE    Brittney Young  HWY:[id number] DOB: Feb 09, 1971 DOA: 03/20/2018 PCP: Donella Stade, PA-C   Brief Narrative:  Patient 47 year old female with history of metastatic cecal adenocarcinoma, status post chemotherapy presented with acute encephalopathy.  She is being managed for encephalopathy, pneumonia, sepsis and hyponatremia.  She underwent treatment for pneumonia with empiric antibiotics.  Also underwent diagnostic paracentesis.  During the hospital stay, she also developed seizure activity.  Neurology was consulted.  Neurology recommended long-term video monitoring EEG which did not show any seizure activity.  There was also concern about metastatic disease contributing to her change in mental status.  Palliative care is assisting on transition of her care to hospice.The goal is to set up Hospice service at home.Hopefully that can happen soon.At this point,planning for DC to home with Hospice on Monday.  Assessment & Plan:   Active Problems:   Metastatic colon cancer in female [facility])   Abnormal liver function   Seizures ([facility])   Acute metabolic encephalopathy   Cirrhosis ([place])   Thrombocytopenia ([facility])  Acute metabolic encephalopathy: Presented with altered mental status. Now alert and oriented.AMS initially thought to be secondary to metastatic liver disease but her serum ammonia was normal.  MRI of the brain did not show any metastatic lesion or abnormal meningeal enhancement.  There was concern for left cerebellar infarct but as per neurology appears most consistent with artifact.  Infectious work-up negative.  Most likely her change in  mental status is secondary to advanced liver disease secondary to metastatic adenocarcinoma of the colon vs post ictal phage vs toxic metabolic encephalopathy.Neurology was following .She is  on Keppra.LTM EEG did not show any seizure activity.Plan is to continue [place]. Continue seizure precaution.  Metastatic adenocarcinoma of the  colon: Metastatic disease to the liver and increased LFT.  Last chemotherapy was on July 8.  As per last oncology note, she is no longer a candidate for chemotherapy and they recommended hospice care.  Palliative care on board.She [facility] oncology at [facility]  Decreased urinary output: Much improved. Renal function normal.  Right Lower extremity numbness: We will start on low dose gabapentin.  Sepsis considered on admission: Pneumonia suspected on admission.  She was never hypoxic.  CT chest showed bilateral pleural effusion, atelectasis no infiltrate, widespread metastatic disease.  Blood cultures no growth till date.  Respiratory viral panel negative.  Urine culture did not show any growth.  Peritoneal fluid did not show any growth.  She is afebrile and hemodynamically stable.  Antibiotics discontinued.  Cirrhosis: Secondary to metastatic disease with associated hyperbilirubinemia, LFTs, thrombocytopenia and ascites.  Continue supportive care.  She was on hydrochlorthiazide which has been changed to lasix.  Hyponatremia: Improvement.  Continue to monitor.  Thrombocytopenia: Most likely associated with chemotherapy and cirrhosis.  We will continue to monitor.  Anemia of chronic disease: Likely associated with chemotherapy.  We will continue to monitor CBC.  Dysphagia: Speech therapy recommended dysphagia 1, thin liquid.  Social/ethics/poor prognosis: Patient has a 72-year-old son and he is unable to make decision.  Currently patient's sister is assisting on making the decision. Palliative Care following regarding goals of care, CODE STATUS discussion and and  transition to hospice.  Currently she is full code.  Patient also has indeterminate left breast mass.     DVT prophylaxis: SCD Code Status: Full Family Communication: None present at the bedside Disposition Plan:hospice at home as soon as arrangement is done.  Sister is busy this weekend so she cannot  take her  home.Likely Monday.   Consultants: palliative care, neurology  Procedures: EEG  Antimicrobials: None  Subjective: Patient seen and examined the bedside this morning.  She is alert and oriented .  She is hemodynamically stable.  She looks comfortable and not in any kind of distress.  Continues to Complain of right lower extremity numbness .  Objective: Vitals:   03/26/18 1658 03/26/18 2300 03/27/18 0300 03/27/18 0748  BP: 117/86 121/74  114/79  Pulse: 90 84    Resp: 20 (!) 22  (!) 21  Temp: 98.2 F (36.8 C) 98.2 F (36.8 C) 98.6 F (37 C) 98.2 F (36.8 C)  TempSrc: Oral Oral Oral Oral  SpO2: 99% 95%    Weight:      Height:        Intake/Output Summary (Last 24 hours) at 03/27/2018 0759 Last data filed at 03/27/2018 0700 Gross per 24 hour  Intake -  Output 750 ml  Net -750 ml   Filed Weights   03/20/18 1803 03/22/18 2044  Weight: 88.9 kg (196 lb) 89.5 kg (197 lb 5 oz)    Examination:  General exam: Appears calm and comfortable ,Not in distress,average built HEENT:PERRL,Oral mucosa moist, Ear/Nose normal on gross exam Respiratory system: Bilateral decreased air entry on the bases otherwise clear cardiovascular system: S1 & S2 heard, RRR. No JVD, murmurs, rubs, gallops or clicks.+1 pedal edema Gastrointestinal system: Abdomen is mildly  distended, soft and nontender. Normal bowel sounds heard. Central nervous system: Alert and oriented times 4. No focal neurological deficits. Extremities: 1+  edema, no clubbing ,no cyanosis, distal peripheral pulses palpable. Skin: No rashes, lesions or ulcers,no icterus ,no pallor     Data Reviewed: I have personally reviewed following labs and imaging studies  CBC: Recent Labs  Lab 03/20/18 1710 03/22/18 0447 03/24/18 0317  WBC 7.7 9.6 9.1  NEUTROABS 6.7  --   --   HGB 10.6* 8.6* 8.9*  HCT 31.7* 25.6* 27.6*  MCV 91.1 94.1 94.5  PLT 91* 59* 59*   Basic Metabolic Panel: Recent Labs  Lab 03/22/18 0447 03/22/18 1634  03/24/18 0317 03/25/18 0630 03/27/18 0540  NA 126* 129* 129* 133* 134*  K 3.4* 3.6 3.4* 4.1 3.2*  CL 95* 93* 97* 98 100  CO2 20* 19* 20* 23 24  GLUCOSE 97 110* 126* 88 104*  BUN 14 15 11 11 9   CREATININE 0.51 0.50 0.54 0.58 0.55  CALCIUM 8.0* 8.7* 8.6* 8.5* 8.3*   GFR: Estimated Creatinine Clearance: 96.2 mL/min (by C-G formula based on SCr of 0.55 mg/dL). Liver Function Tests: Recent Labs  Lab 03/20/18 1710 03/22/18 1634 03/24/18 0317  AST 223* 140* 113*  ALT 251* 160* 134*  ALKPHOS 588* 513* 533*  BILITOT 5.6* 3.9* 2.7*  PROT 7.7 7.5 6.6  ALBUMIN 3.1* 2.8* 2.3*   No results for input(s): LIPASE, AMYLASE in the last 168 hours. Recent Labs  Lab 03/20/18 2039 03/22/18 1627  AMMONIA 47* 24   Coagulation Profile: No results for input(s): INR, PROTIME in the last 168 hours. Cardiac Enzymes: Recent Labs  Lab 03/20/18 2202 03/21/18 0454 03/21/18 1001  TROPONINI 0.04* 0.04* 0.04*   BNP (last 3 results) No results for input(s): PROBNP in the last 8760 hours. HbA1C: No results for input(s): HGBA1C in the last 72 hours. CBG: Recent Labs  Lab 03/20/18 1738  GLUCAP 105*   Lipid Profile: No results for input(s): CHOL, HDL, LDLCALC, TRIG, CHOLHDL, LDLDIRECT in the last 72 hours. Thyroid Function Tests: No  results for input(s): TSH, T4TOTAL, FREET4, T3FREE, THYROIDAB in the last 72 hours. Anemia Panel: No results for input(s): VITAMINB12, FOLATE, FERRITIN, TIBC, IRON, RETICCTPCT in the last 72 hours. Sepsis Labs: Recent Labs  Lab 03/20/18 1816 03/21/18 0103 03/21/18 0454  LATICACIDVEN 3.39* 1.8 1.9    Recent Results (from the past 240 hour(s))  Culture, blood (routine x 2)     Status: None   Collection Time: 03/20/18  5:49 PM  Result Value Ref Range Status   Specimen Description   Final    BLOOD RIGHT ANTECUBITAL Performed at [facility], [place] [address] [place], [place] [address]    Special Requests   Final    BOTTLES DRAWN AEROBIC  AND ANAEROBIC Blood Culture adequate volume Performed at [place] [address] [place], [place] [address]    Culture   Final    NO GROWTH 5 DAYS Performed at [facility], [place] [address], [place], [place] [address]    Report Status 03/25/2018 FINAL  Final  Culture, blood (routine x 2)     Status: None   Collection Time: 03/20/18  5:59 PM  Result Value Ref Range Status   Specimen Description   Final    BLOOD RIGHT ANTECUBITAL Performed at [place] [address], [place], [place] [address]    Special Requests   Final    BOTTLES DRAWN AEROBIC AND ANAEROBIC Blood Culture adequate volume Performed at [place] [address], [place], [place] [address]    Culture   Final    NO GROWTH 5 DAYS Performed at [facility], [place] [address], [place], [place] [address]    Report Status 03/25/2018 FINAL  Final  Respiratory Panel by PCR     Status: None   Collection Time: 03/21/18  7:50 AM  Result Value Ref Range Status   Adenovirus NOT DETECTED NOT DETECTED Final   Coronavirus 229E NOT DETECTED NOT DETECTED Final   Coronavirus HKU1 NOT DETECTED NOT DETECTED Final   Coronavirus NL63 NOT DETECTED NOT DETECTED Final   Coronavirus OC43 NOT DETECTED NOT DETECTED Final   Metapneumovirus NOT DETECTED NOT DETECTED Final   Rhinovirus / Enterovirus NOT DETECTED NOT DETECTED Final   Influenza A NOT DETECTED NOT DETECTED Final   Influenza B NOT DETECTED NOT DETECTED Final   Parainfluenza Virus 1 NOT DETECTED NOT DETECTED Final   Parainfluenza Virus 2 NOT DETECTED NOT DETECTED Final   Parainfluenza Virus 3 NOT DETECTED NOT DETECTED Final   Parainfluenza Virus 4 NOT DETECTED NOT DETECTED Final   Respiratory Syncytial Virus NOT DETECTED NOT DETECTED Final   Bordetella pertussis NOT DETECTED NOT DETECTED Final   Chlamydophila pneumoniae NOT DETECTED NOT DETECTED Final   Mycoplasma pneumoniae NOT DETECTED NOT DETECTED  Final    Comment: Performed at [facility], [place] [address], [place], [place] [address]  Culture, body fluid-bottle     Status: None   Collection Time: 03/21/18  1:42 PM  Result Value Ref Range Status   Specimen Description PERITONEAL  Final   Special Requests NONE  Final   Culture   Final    NO GROWTH 5 DAYS Performed at [facility], [address] [place],  [address]    Report Status 03/26/2018 FINAL  Final  Gram stain     Status: None   Collection Time: 03/21/18  1:42 PM  Result Value Ref Range Status   Specimen Description  PERITONEAL  Final   Special Requests NONE  Final   Gram Stain   Final    RARE WBC PRESENT, PREDOMINANTLY PMN NO ORGANISMS SEEN Performed at [facility], [place] [address], [place], [place] [address]    Report Status 03/22/2018 FINAL  Final  Culture, Urine     Status: None   Collection Time: 03/21/18  4:39 PM  Result Value Ref Range Status   Specimen Description   Final    URINE, RANDOM Performed at [place] [address] [place], [place] [address]    Special Requests   Final    NONE Performed at [facility], [place] [address] [place], [place] [address]    Culture   Final    NO GROWTH Performed at [facility], [place] [address] [place], [place] [address]    Report Status 03/23/2018 FINAL  Final  MRSA PCR Screening     Status: None   Collection Time: 03/23/18  1:42 AM  Result Value Ref Range Status   MRSA by PCR NEGATIVE NEGATIVE Final    Comment:        The GeneXpert MRSA Assay (FDA approved for NASAL specimens only), is one component of a comprehensive MRSA colonization surveillance program. It is not intended to diagnose MRSA infection nor to guide or monitor treatment for MRSA infections. Performed at [facility], [place] [address], [place], [place] [address]          Radiology Studies: Ir Abdomen US Limited  Result Date: 03/26/2018 CLINICAL DATA:   Metastatic colon cancer, assess for paracentesis EXAM: LIMITED ABDOMEN ULTRASOUND FOR ASCITES TECHNIQUE: Limited ultrasound survey for ascites was performed in all four abdominal quadrants. COMPARISON:  03/21/2018 FINDINGS: Survey of the abdomen demonstrates a small amount of perihepatic ascites. No large volume of ascites within the abdominal 4 quadrants to warrant therapeutic paracentesis. Procedure not performed. Diffuse liver metastases noted. IMPRESSION: Small volume ascites. Not enough to warrant therapeutic paracentesis. Electronically Signed   By: Jerilynn Mages.  Shick M.D.   On: 03/26/2018 12:52        Scheduled Meds: . citalopram  20 mg Oral Daily  . clotrimazole  1 Applicatorful Vaginal QHS  . dexamethasone  4 mg Oral Daily  . furosemide  20 mg Oral Daily  . LORazepam  0.26 mg Oral BID  . metoprolol tartrate  50 mg Oral BID  . ondansetron (ZOFRAN) IV  4 mg Intravenous TID AC & HS  . potassium chloride  20 mEq Oral Daily  . potassium chloride  20 mEq Oral Once  . sodium chloride flush  10-40 mL Intracatheter Q12H   Continuous Infusions: . levETIRAcetam 500 mg (03/27/18 0730)     LOS: 7 days    Time spent: 25 mins.More than 50% of that time was spent in counseling and/or coordination of care.      Shelly Coss, MD [facility] Pager [phone]  If 7PM-7AM, please contact night-coverage [email or web address] Password [facility] 03/27/2018, 7:59 AM

## 2018-03-27 NOTE — Plan of Care (Signed)
Discussed plan of care with patient.  Patient will remain at [facility] until Monday.  At that time her family will take care of her.  Patient seems to have a good understanding of her diagnosis and prospect for the future,  Some teach back displayed.

## 2018-03-28 NOTE — Progress Notes (Signed)
PROGRESS NOTE    LORIJEAN HUSSER  WCB:[id number] DOB: 01-25-1971 DOA: 03/20/2018 PCP: Donella Stade, PA-C   Brief Narrative:  Patient 47 year old female with history of metastatic cecal adenocarcinoma, status post chemotherapy presented with acute encephalopathy.  She is being managed for encephalopathy, pneumonia, sepsis and hyponatremia.  She underwent treatment for pneumonia with empiric antibiotics.  Also underwent diagnostic paracentesis.  During the hospital stay, she also developed seizure activity.  Neurology was consulted.  Neurology recommended long-term video monitoring EEG which did not show any seizure activity.  There was also concern about metastatic disease contributing to her change in mental status.  Palliative care is assisting on transition of her care to hospice.The goal is to set up Hospice service at home.Hopefully that can happen soon.At this point,planning for DC to home with Hospice on Monday.  Assessment & Plan:   Active Problems:   Metastatic colon cancer in female [facility])   Abnormal liver function   Seizures (HCC)   Acute metabolic encephalopathy   Cirrhosis ([place])   Thrombocytopenia (HCC)  Acute metabolic encephalopathy: Presented with altered mental status. Now alert and oriented but intermittently confused.AMS initially thought to be secondary to metastatic liver disease but her serum ammonia was normal.  MRI of the brain did not show any metastatic lesion or abnormal meningeal enhancement.  There was concern for left cerebellar infarct but as per neurology appears most consistent with artifact.  Infectious work-up negative.  Most likely her change in  mental status is secondary to advanced liver disease secondary to metastatic adenocarcinoma of the colon vs post ictal phage vs toxic metabolic encephalopathy.Neurology was following .She is  on Keppra.LTM EEG did not show any seizure activity.Plan is to continue [place]. Continue seizure  precaution.  Metastatic adenocarcinoma of the colon: Metastatic disease to the liver and increased LFT.  Last chemotherapy was on July 8.  As per last oncology note, she is no longer a candidate for chemotherapy and they recommended hospice care.  Palliative care on board.She follows with oncology at [facility]  Decreased urinary output: Much improved. Renal function normal.  Right Lower extremity numbness: We will start on low dose gabapentin.  Sepsis considered on admission: Pneumonia suspected on admission.  She was never hypoxic.  CT chest showed bilateral pleural effusion, atelectasis no infiltrate, widespread metastatic disease.  Blood cultures no growth till date.  Respiratory viral panel negative.  Urine culture did not show any growth.  Peritoneal fluid did not show any growth.  She is afebrile and hemodynamically stable.  Antibiotics discontinued.  Cirrhosis: Secondary to metastatic disease with associated hyperbilirubinemia, LFTs, thrombocytopenia and ascites.  Continue supportive care.  She was on hydrochlorthiazide which has been changed to lasix.  Attempted to do ultrasound-guided paracentesis but there is not much significant fluid.  Hyponatremia: Improvement.  Continue to monitor.  Thrombocytopenia: Most likely associated with chemotherapy and cirrhosis.  We will continue to monitor.  Anemia of chronic disease: Likely associated with chemotherapy.  We will continue to monitor CBC.  Dysphagia: Speech therapy recommended dysphagia 1, thin liquid.  Social/ethics/poor prognosis: Patient has a 39-year-old son and he is unable to make decision.  Currently patient's sister is assisting on making the decision. Palliative Care following regarding goals of care, CODE STATUS discussion and and  transition to hospice.  Currently she is full code.  Patient also has indeterminate left breast mass.     DVT prophylaxis: SCD Code Status: Full Family Communication: None  present at the bedside Disposition Plan:hospice  at home as soon as arrangement is done.  Sister is busy this weekend so she cannot take her home.Likely Monday.   Consultants: palliative care, neurology  Procedures: EEG  Antimicrobials: None  Subjective: Patient seen and examined the bedside this morning.  She is hemodynamically stable.  She looks comfortable and not in any kind of distress.  Right lower extremity numbness has improved  . Plan for discharge tomorrow to hospice at home.   Objective: Vitals:   03/27/18 1657 03/27/18 2252 03/28/18 0300 03/28/18 0802  BP: (!) 120/91 (!) 136/97 118/86 (!) 119/91  Pulse:  86 87 91  Resp: 18  (!) 24 (!) 23  Temp: 98.2 F (36.8 C)  98.3 F (36.8 C) 97.7 F (36.5 C)  TempSrc: Oral  Oral Oral  SpO2:   96% 100%  Weight:      Height:        Intake/Output Summary (Last 24 hours) at 03/28/2018 7616 Last data filed at 03/27/2018 2256 Gross per 24 hour  Intake 10 ml  Output -  Net 10 ml   Filed Weights   03/20/18 1803 03/22/18 2044  Weight: 88.9 kg (196 lb) 89.5 kg (197 lb 5 oz)    Examination:  General exam: Appears calm and comfortable ,Not in distress,average built HEENT:PERRL,Oral mucosa moist, Ear/Nose normal on gross exam Respiratory system: Bilateral decreased air entry on the bases otherwise clear cardiovascular system: S1 & S2 heard, RRR. No JVD, murmurs, rubs, gallops or clicks.+1 pedal edema Gastrointestinal system: Abdomen is mildly  distended, soft and nontender. Normal bowel sounds heard. Central nervous system: Alert and oriented times 4. No focal neurological deficits. Extremities: 1+  edema, no clubbing ,no cyanosis, distal peripheral pulses palpable. Skin: No rashes, lesions or ulcers,no icterus ,no pallor     Data Reviewed: I have personally reviewed following labs and imaging studies  CBC: Recent Labs  Lab 03/22/18 0447 03/24/18 0317  WBC 9.6 9.1  HGB 8.6* 8.9*  HCT 25.6* 27.6*  MCV 94.1 94.5  PLT  59* 59*   Basic Metabolic Panel: Recent Labs  Lab 03/22/18 0447 03/22/18 1634 03/24/18 0317 03/25/18 0630 03/27/18 0540  NA 126* 129* 129* 133* 134*  K 3.4* 3.6 3.4* 4.1 3.2*  CL 95* 93* 97* 98 100  CO2 20* 19* 20* 23 24  GLUCOSE 97 110* 126* 88 104*  BUN 14 15 11 11 9   CREATININE 0.51 0.50 0.54 0.58 0.55  CALCIUM 8.0* 8.7* 8.6* 8.5* 8.3*   GFR: Estimated Creatinine Clearance: 96.2 mL/min (by C-G formula based on SCr of 0.55 mg/dL). Liver Function Tests: Recent Labs  Lab 03/22/18 1634 03/24/18 0317  AST 140* 113*  ALT 160* 134*  ALKPHOS 513* 533*  BILITOT 3.9* 2.7*  PROT 7.5 6.6  ALBUMIN 2.8* 2.3*   No results for input(s): LIPASE, AMYLASE in the last 168 hours. Recent Labs  Lab 03/22/18 1627  AMMONIA 24   Coagulation Profile: No results for input(s): INR, PROTIME in the last 168 hours. Cardiac Enzymes: Recent Labs  Lab 03/21/18 1001  TROPONINI 0.04*   BNP (last 3 results) No results for input(s): PROBNP in the last 8760 hours. HbA1C: No results for input(s): HGBA1C in the last 72 hours. CBG: No results for input(s): GLUCAP in the last 168 hours. Lipid Profile: No results for input(s): CHOL, HDL, LDLCALC, TRIG, CHOLHDL, LDLDIRECT in the last 72 hours. Thyroid Function Tests: No results for input(s): TSH, T4TOTAL, FREET4, T3FREE, THYROIDAB in the last 72 hours. Anemia Panel: No results  for input(s): VITAMINB12, FOLATE, FERRITIN, TIBC, IRON, RETICCTPCT in the last 72 hours. Sepsis Labs: No results for input(s): PROCALCITON, LATICACIDVEN in the last 168 hours.  Recent Results (from the past 240 hour(s))  Culture, blood (routine x 2)     Status: None   Collection Time: 03/20/18  5:49 PM  Result Value Ref Range Status   Specimen Description   Final    BLOOD RIGHT ANTECUBITAL Performed at [facility] [address] [place], [place] [address]    Special Requests   Final    BOTTLES DRAWN AEROBIC AND ANAEROBIC Blood Culture adequate  volume Performed at [facility] [address] [place], [place] [address]    Culture   Final    NO GROWTH 5 DAYS Performed at [facility], [place] [address] [place], [place] [address]    Report Status 03/25/2018 FINAL  Final  Culture, blood (routine x 2)     Status: None   Collection Time: 03/20/18  5:59 PM  Result Value Ref Range Status   Specimen Description   Final    BLOOD RIGHT ANTECUBITAL Performed at [facility] [address] [place], [place] [address]    Special Requests   Final    BOTTLES DRAWN AEROBIC AND ANAEROBIC Blood Culture adequate volume Performed at [facility] [address] [place], [place] [address]    Culture   Final    NO GROWTH 5 DAYS Performed at [facility], [place] [address], [place], [place] [address]    Report Status 03/25/2018 FINAL  Final  Respiratory Panel by PCR     Status: None   Collection Time: 03/21/18  7:50 AM  Result Value Ref Range Status   Adenovirus NOT DETECTED NOT DETECTED Final   Coronavirus 229E NOT DETECTED NOT DETECTED Final   Coronavirus HKU1 NOT DETECTED NOT DETECTED Final   Coronavirus NL63 NOT DETECTED NOT DETECTED Final   Coronavirus OC43 NOT DETECTED NOT DETECTED Final   Metapneumovirus NOT DETECTED NOT DETECTED Final   Rhinovirus / Enterovirus NOT DETECTED NOT DETECTED Final   Influenza A NOT DETECTED NOT DETECTED Final   Influenza B NOT DETECTED NOT DETECTED Final   Parainfluenza Virus 1 NOT DETECTED NOT DETECTED Final   Parainfluenza Virus 2 NOT DETECTED NOT DETECTED Final   Parainfluenza Virus 3 NOT DETECTED NOT DETECTED Final   Parainfluenza Virus 4 NOT DETECTED NOT DETECTED Final   Respiratory Syncytial Virus NOT DETECTED NOT DETECTED Final   Bordetella pertussis NOT DETECTED NOT DETECTED Final   Chlamydophila pneumoniae NOT DETECTED NOT DETECTED Final   Mycoplasma pneumoniae NOT DETECTED NOT DETECTED Final    Comment: Performed at [facility], [place] [address], [place], [place] [address]  Culture, body fluid-bottle     Status: None   Collection Time: 03/21/18  1:42 PM  Result Value Ref Range Status   Specimen Description PERITONEAL  Final   Special Requests NONE  Final   Culture   Final    NO GROWTH 5 DAYS Performed at [facility], [address], [place], [place] [address]    Report Status 03/26/2018 FINAL  Final  Gram stain     Status: None   Collection Time: 03/21/18  1:42 PM  Result Value Ref Range Status   Specimen Description PERITONEAL  Final   Special Requests NONE  Final   Gram Stain   Final    RARE WBC PRESENT,  PREDOMINANTLY PMN NO ORGANISMS SEEN Performed at [facility], [place] [address], [place], [place] [address]    Report Status 03/22/2018 FINAL  Final  Culture, Urine     Status: None   Collection Time: 03/21/18  4:39 PM  Result Value Ref Range Status   Specimen Description   Final    URINE, RANDOM Performed at [facility] [address], [place], [place] [address]    Special Requests   Final    NONE Performed at [facility], [place] [address] [place], [place] [address]    Culture   Final    NO GROWTH Performed at [facility], [place] [address], [place], [place] [address]    Report Status 03/23/2018 FINAL  Final  MRSA PCR Screening     Status: None   Collection Time: 03/23/18  1:42 AM  Result Value Ref Range Status   MRSA by PCR NEGATIVE NEGATIVE Final    Comment:        The GeneXpert MRSA Assay (FDA approved for NASAL specimens only), is one component of a comprehensive MRSA colonization surveillance program. It is not intended to diagnose MRSA infection nor to guide or monitor treatment for MRSA infections. Performed at [facility], [place] [address], [place], [place] [address]          Radiology Studies: Ir Abdomen US Limited  Result Date: 03/26/2018 CLINICAL DATA:  Metastatic colon cancer, assess for  paracentesis EXAM: LIMITED ABDOMEN ULTRASOUND FOR ASCITES TECHNIQUE: Limited ultrasound survey for ascites was performed in all four abdominal quadrants. COMPARISON:  03/21/2018 FINDINGS: Survey of the abdomen demonstrates a small amount of perihepatic ascites. No large volume of ascites within the abdominal 4 quadrants to warrant therapeutic paracentesis. Procedure not performed. Diffuse liver metastases noted. IMPRESSION: Small volume ascites. Not enough to warrant therapeutic paracentesis. Electronically Signed   By: Jerilynn Mages.  Shick M.D.   On: 03/26/2018 12:52        Scheduled Meds: . citalopram  20 mg Oral Daily  . clotrimazole  1 Applicatorful Vaginal QHS  . dexamethasone  4 mg Oral Daily  . furosemide  20 mg Oral Daily  . gabapentin  100 mg Oral TID  . LORazepam  0.26 mg Oral BID  . metoprolol tartrate  50 mg Oral BID  . ondansetron (ZOFRAN) IV  4 mg Intravenous TID AC & HS  . potassium chloride  20 mEq Oral Daily  . sodium chloride flush  10-40 mL Intracatheter Q12H   Continuous Infusions: . levETIRAcetam 500 mg (03/27/18 2303)     LOS: 8 days    Time spent: 25 mins.More than 50% of that time was spent in counseling and/or coordination of care.      Shelly Coss, MD [facility] Pager [phone]  If 7PM-7AM, please contact night-coverage [email or web address] Password [facility] 03/28/2018, 8:28 AM

## 2018-03-28 NOTE — Progress Notes (Addendum)
[facility])  Continue to follow for admission to [facility] at home after discharge. Spoke with patient's PCG/sister [name] who confirms [employer] to deliver DME this evening at family request. [name] expressed concern over how patient will transport home. [name] is requesting ambulance transportation. [name] requested to be contacted by hospital staff Monday morning regarding time of patient's discharge. She tells me she will need to make arrangements to have someone at home when patient arrives. She tells me she has made bedside RN aware of the situation.   [facility] will continue to follow until time of discharge.   Please send discharge summary to [phone]. Please send home with patient scripts for any medication she does not already have including comfort medications.   Thank you,  [name], LCSW [phone]

## 2018-03-29 DIAGNOSIS — Z515 Encounter for palliative care: Secondary | ICD-10-CM

## 2018-03-29 DIAGNOSIS — C189 Malignant neoplasm of colon, unspecified: Secondary | ICD-10-CM

## 2018-03-29 LAB — BASIC METABOLIC PANEL
Anion gap: 8 (ref 5–15)
BUN: 15 mg/dL (ref 6–20)
CALCIUM: 8.4 mg/dL — AB (ref 8.9–10.3)
CHLORIDE: 103 mmol/L (ref 98–111)
CO2: 25 mmol/L (ref 22–32)
CREATININE: 0.67 mg/dL (ref 0.44–1.00)
GFR calc Af Amer: >60 mL/min (ref >60)
GFR calc non Af Amer: >60 mL/min (ref >60)
Glucose, Bld: 89 mg/dL (ref 70–99)
Potassium: 3.7 mmol/L (ref 3.5–5.1)
SODIUM: 136 mmol/L (ref 135–145)

## 2018-03-29 MED ORDER — LORAZEPAM 2 MG/ML PO CONC: 0.6000 mg | Freq: Four times a day (QID) | ORAL | 0 refills | Status: AC | PRN

## 2018-03-29 MED ORDER — METOPROLOL TARTRATE 50 MG PO TABS: 50.0000 mg | ORAL_TABLET | Freq: Two times a day (BID) | ORAL | 0 refills | Status: AC

## 2018-03-29 MED ORDER — DEXAMETHASONE 4 MG PO TABS: 4.0000 mg | ORAL_TABLET | Freq: Every day | ORAL | 0 refills | Status: AC

## 2018-03-29 MED ORDER — ONDANSETRON 4 MG PO TBDP: 4.0000 mg | ORAL_TABLET | ORAL | 0 refills | Status: AC | PRN

## 2018-03-29 MED ORDER — FUROSEMIDE 20 MG PO TABS: 20.0000 mg | ORAL_TABLET | Freq: Every day | ORAL | 0 refills | Status: AC

## 2018-03-29 MED ORDER — MORPHINE SULFATE (CONCENTRATE) 10 MG/0.5ML PO SOLN: 10.0000 mg | ORAL | 0 refills | Status: AC | PRN

## 2018-03-29 MED ORDER — CLOTRIMAZOLE 1 % VA CREA: 1.0000 | TOPICAL_CREAM | Freq: Every day | VAGINAL | 0 refills | Status: AC

## 2018-03-29 MED ORDER — KEPPRA 500 MG PO TABS: 500.0000 mg | ORAL_TABLET | Freq: Two times a day (BID) | ORAL | 0 refills | Status: AC

## 2018-03-29 MED ORDER — HEPARIN SOD (PORK) LOCK FLUSH 100 UNIT/ML IV SOLN
500.0000 [IU] | INTRAVENOUS | Status: AC | PRN
  Administered 2018-03-29: 500 [IU]

## 2018-03-29 NOTE — Discharge Summary (Signed)
Physician Discharge Summary  CHRISTIN MOLINE AOZ:[id number] DOB: Jan 30, 1971 DOA: 03/20/2018  PCP: Donella Stade, PA-C  Admit date: 03/20/2018 Discharge date: 03/29/2018  Admitted From: Home Disposition:  Home  Discharge Condition:Stable CODE STATUS:FULL Diet recommendation:  Regular   Brief/Interim Summary:  Patient 47 year old female with history of metastatic cecal adenocarcinoma, status post chemotherapy presented with acute encephalopathy.  She was being managed for encephalopathy, pneumonia, sepsis and hyponatremia.  She underwent treatment for pneumonia with empiric antibiotics.  Also underwent diagnostic paracentesis.  During the hospital stay, she also developed seizure activity.  Neurology was consulted.  Neurology recommended long-term video monitoring EEG which did not show any seizure activity.  There was also concern about metastatic disease contributing to her change in mental status.  Mental status has improved.Palliative care was assisting on transition of her care to hospice.She has terminal stage cancer and there is no plan for further treatment. After extensive discussion,we decided to discharge her home with hospice services.  Following problems were addressed during her hospitalization:  Acute metabolic encephalopathy: Presented with altered mental status. Now alert and oriented but intermittently confused.AMS initially thought to be secondary to metastatic liver disease but her serum ammonia was normal.  MRI of the brain did not show any metastatic lesion or abnormal meningeal enhancement.  There was concern for left cerebellar infarct but as per neurology appears most consistent with artifact.  Infectious work-up negative.  Most likely her change in  mental status is secondary to advanced liver disease secondary to metastatic adenocarcinoma of the colon vs post ictal phage vs toxic metabolic encephalopathy.Neurology was following .She is  on Keppra.LTM EEG did not show  any seizure activity.Plan is to continue [place].  Metastatic adenocarcinoma of the colon: Metastatic disease to the liver and increased LFT.  Last chemotherapy was on July 8.  As per last oncology note, she is no longer a candidate for chemotherapy and they recommended hospice care.  Palliative care on board.She was following  [facility] oncology [facility]  Decreased urinary output: Much improved. Renal function normal.  Right Lower extremity numbness: Continue pregabalin at home.  Sepsis considered on admission: Pneumonia suspected on admission.  She was never hypoxic.  CT chest showed bilateral pleural effusion, atelectasis no infiltrate, widespread metastatic disease.  Blood cultures no growth till date.  Respiratory viral panel negative.  Urine culture did not show any growth.  Peritoneal fluid did not show any growth.  She is afebrile and hemodynamically stable.  Antibiotics discontinued.  Cirrhosis: Secondary to metastatic disease with associated hyperbilirubinemia, LFTs, thrombocytopenia and ascites.  Continue supportive care.  She was on hydrochlorthiazide which has been changed to lasix.  Attempted to do ultrasound-guided paracentesis but there is not much significant fluid.  Hyponatremia: Improvement.  Continue to monitor.  Thrombocytopenia: Most likely associated with chemotherapy and cirrhosis.    Anemia of chronic disease: Likely associated with chemotherapy/malignancy.   Loss of appetite/nausea/Weakness: Palliative care started on dexamethasone 4 mg daily.  Social/ethics/poor prognosis: Patient has a 32-year-old son and he is unable to make decision.  Currently patient's sister is assisting on making the decision. Palliative Care following regarding goals of care, CODE STATUS discussion and and  transition to hospice.  Currently she is full code.  Patient also has indeterminate left breast mass.    Discharge Diagnoses:  Active Problems:   Metastatic  colon cancer in female [facility])   Abnormal liver function   Seizures ([place])   Acute metabolic encephalopathy   Cirrhosis ([place])  Thrombocytopenia [facility])    Discharge Instructions  Discharge Instructions    Diet general   Complete by:  As directed    Discharge instructions   Complete by:  As directed    1) Take prescribed medications as instructed. 2) Follow up with Hospice Services.   Increase activity slowly   Complete by:  As directed      Allergies as of 03/29/2018   No Known Allergies     Medication List    STOP taking these medications   acyclovir 400 MG tablet Commonly known as:  ZOVIRAX   hydrochlorothiazide 25 MG tablet Commonly known as:  HYDRODIURIL     TAKE these medications   citalopram 20 MG tablet Commonly known as:  CELEXA Take 1 tablet (20 mg total) by mouth daily.   clotrimazole 1 % vaginal cream Commonly known as:  GYNE-LOTRIMIN Place 1 Applicatorful vaginally at bedtime for 4 days.   dexamethasone 4 MG tablet Commonly known as:  DECADRON Take 1 tablet (4 mg total) by mouth daily.   Ferrous Fumarate 324 (106 Fe) MG Tabs tablet Commonly known as:  HEMOCYTE - 106 mg FE Take 1 tablet (106 mg of iron total) by mouth 2 (two) times daily before a meal.   furosemide 20 MG tablet Commonly known as:  LASIX Take 1 tablet (20 mg total) by mouth daily.   KEPPRA 500 MG tablet Generic drug:  levETIRAcetam Take 1 tablet (500 mg total) by mouth 2 (two) times daily.   LORazepam 2 MG/ML concentrated solution Commonly known as:  ATIVAN Take 0.3 mLs (0.6 mg total) by mouth every 6 (six) hours as needed for anxiety or sleep.   metoprolol tartrate 50 MG tablet Commonly known as:  LOPRESSOR Take 1 tablet (50 mg total) by mouth 2 (two) times daily. What changed:    medication strength  how much to take  how to take this  when to take this  additional instructions   morphine CONCENTRATE 10 MG/0.5ML Soln concentrated solution Place 0.5 mLs (10 mg  total) under the tongue every 2 (two) hours as needed for severe pain or shortness of breath.   ondansetron 4 MG disintegrating tablet Commonly known as:  ZOFRAN-ODT Take 1 tablet (4 mg total) by mouth every 4 (four) hours as needed for nausea or vomiting.   potassium chloride SA 20 MEQ tablet Commonly known as:  K-DUR,KLOR-CON Take 1 tablet (20 mEq total) by mouth daily with breakfast.   pregabalin 25 MG capsule Commonly known as:  LYRICA Take 1 capsule (25 mg total) by mouth 2 (two) times daily.      Follow-up Information    [facility]  Follow up.   Why:  home hospice Contact information: [place] [phone]         No Known Allergies  Consultations: Palliative care,neurology  Procedures/Studies: Dg Lumbar Spine Complete  Result Date: 03/10/2018 CLINICAL DATA:  BILATERAL leg weakness for several weeks, history metastatic colon cancer on chemotherapy, hypertension EXAM: LUMBAR SPINE - COMPLETE 4+ VIEW COMPARISON:  02/17/2017 FINDINGS: 5 non-rib-bearing lumbar vertebra. Minimal levoconvex lumbar scoliosis unchanged. Vertebral body and disc space heights maintained. No acute fracture, subluxation, or bone destruction. No spondylolysis. SI joints preserved. IMPRESSION: No acute osseous abnormalities. Electronically Signed   By: Lavonia Dana M.D.   On: 03/10/2018 14:35   Dg Knee 1-2 Views Left  Result Date: 03/21/2018 CLINICAL DATA:  Left knee pain. EXAM: LEFT KNEE - 1-2 VIEW  COMPARISON:  None. FINDINGS: No fracture.  No bone lesion. Knee joint is normally spaced and aligned. No arthropathic changes. No joint effusion. Soft tissues are unremarkable. IMPRESSION: Negative. Electronically Signed   By: Lajean Manes M.D.   On: 03/21/2018 18:16   Dg Ankle Complete Left  Result Date: 03/20/2018 CLINICAL DATA:  Ankle pain. Stage IV colon cancer. No trauma history submitted. EXAM: LEFT ANKLE COMPLETE - 3+ VIEW  COMPARISON:  None. FINDINGS: Diffuse soft tissue swelling could be due to patient body habitus. No acute fracture or dislocation. Base of fifth metatarsal and talar dome intact. IMPRESSION: No acute osseous abnormality. Electronically Signed   By: Abigail Miyamoto M.D.   On: 03/20/2018 19:06   Dg Ankle Complete Right  Result Date: 03/20/2018 CLINICAL DATA:  Patient arrived via [facility] from home. Patient has had altered mental status and generalized weakness for the past day. Stage 4 colon cancer pt. Patient states to the EMS, "I am a ghost, I am a alien, I cant walk , I am white". PT c/o bilateral ankle pain. EXAM: RIGHT ANKLE - COMPLETE 3+ VIEW COMPARISON:  None. FINDINGS: Diffuse soft tissue swelling could be habitus induced. No acute fracture or dislocation. Base of fifth metatarsal and talar dome intact. No focal osseous lesion. IMPRESSION: No acute osseous abnormality. Electronically Signed   By: Abigail Miyamoto M.D.   On: 03/20/2018 19:07   Ct Head Wo Contrast  Result Date: 03/20/2018 CLINICAL DATA:  Altered consciousness.  Colon cancer. EXAM: CT HEAD WITHOUT CONTRAST TECHNIQUE: Contiguous axial images were obtained from the base of the skull through the vertex without intravenous contrast. COMPARISON:  10/12/2004 FINDINGS: Brain: Mild cerebral atrophy for age. Dural calcifications again identified. No mass lesion, hemorrhage, hydrocephalus, acute infarct, intra-axial, or extra-axial fluid collection. Vascular: No hyperdense vessel or unexpected calcification. Skull: Normal Sinuses/Orbits: Normal imaged portions of the orbits and globes. Clear paranasal sinuses and mastoid air cells. Other: None. IMPRESSION: Mild cerebral atrophy.  No acute intracranial abnormality. Electronically Signed   By: Abigail Miyamoto M.D.   On: 03/20/2018 17:40   Ct Angio Chest Pe W Or Wo Contrast  Result Date: 03/21/2018 CLINICAL DATA:  Altered mental status with elevated D-dimer levels. Evaluate for acute pulmonary embolism.  Metastatic colon cancer, chemotherapy ongoing. Cough. EXAM: CT ANGIOGRAPHY CHEST WITH CONTRAST TECHNIQUE: Multidetector CT imaging of the chest was performed using the standard protocol during bolus administration of intravenous contrast. Multiplanar CT image reconstructions and MIPs were obtained to evaluate the vascular anatomy. CONTRAST:  120mL ISOVUE-370 IOPAMIDOL (ISOVUE-370) INJECTION 76% COMPARISON:  Chest radiographs 03/20/2018. Abdominal ultrasound 03/20/2018. Limited correlation made with lumbar MRI 11/17/2016. FINDINGS: Cardiovascular: The pulmonary arteries are well opacified with contrast to the level of the subsegmental branches. There is no evidence of acute pulmonary embolism. Mild aortic atherosclerosis without acute vascular findings. Right IJ Port-A-Cath extends to the superior cavoatrial junction. The heart size is normal. There is no pericardial effusion. Mediastinum/Nodes: There are no enlarged mediastinal, hilar, axillary or internal mammary lymph nodes. The thyroid gland, trachea and esophagus demonstrate no significant findings. Lungs/Pleura: There are small right-greater-than-left dependent pleural effusions. Mild dependent atelectasis is present in both lungs. There is no confluent airspace opacity or suspicious pulmonary nodule. Upper abdomen: The liver is enlarged with numerous ill-defined low-density masses, most consistent with widespread metastatic disease. Representative lesion measures 5.1 cm in the right lobe on image 68/4. There is some upper abdominal ascites. Musculoskeletal/Chest wall: Peripheral left breast mass is incompletely visualized, measuring up to 4.1  cm on image 122/7. No other chest wall masses are seen. There are no suspicious osseous findings. Review of the MIP images confirms the above findings. IMPRESSION: 1. No evidence of acute pulmonary embolism. 2. Bilateral pleural effusions with bibasilar atelectasis. 3. Widespread hepatic metastatic disease. If this has  not been previously documented elsewhere, consider dedicated abdominopelvic CT for further staging. 4. Indeterminate left breast mass. This apparently has been previously evaluated by mammography, ultrasound and biopsy in 2015. 5. No evidence of thoracic nodal, pulmonary or osseous metastases. Electronically Signed   By: Richardean Sale M.D.   On: 03/21/2018 02:36   Mr Jeri Cos XH Contrast  Result Date: 03/22/2018 CLINICAL DATA:  Altered mental status. History of metastatic colon cancer EXAM: MRI HEAD WITHOUT AND WITH CONTRAST TECHNIQUE: Multiplanar, multiecho pulse sequences of the brain and surrounding structures were obtained without and with intravenous contrast. CONTRAST:  44mL MULTIHANCE GADOBENATE DIMEGLUMINE 529 MG/ML IV SOLN COMPARISON:  CT head 03/20/2018 FINDINGS: Brain: Small acute infarct left lateral cerebellum which is not enhance but shows restricted diffusion. No surrounding edema. Small white matter hyperintensities bilaterally most likely due to chronic microvascular ischemia. Negative for hemorrhage or mass. Normal enhancement postcontrast infusion. No evidence of metastatic disease. Vascular: Normal arterial flow voids Skull and upper cervical spine: No skull lesion. Extensive dural ossification bilaterally is likely degenerative. Sinuses/Orbits: Negative Other: None IMPRESSION: Small acute infarct left lateral cerebellum. Negative for metastatic disease to the head. Electronically Signed   By: Franchot Gallo M.D.   On: 03/22/2018 15:22   US Abdomen Limited  Result Date: 03/20/2018 CLINICAL DATA:  47 y/o  F; abnormal liver function. EXAM: ULTRASOUND ABDOMEN LIMITED RIGHT UPPER QUADRANT COMPARISON:  None. FINDINGS: Gallbladder: Cholelithiasis. No gallbladder wall thickening. No pericholecystic fluid. Negative sonographic Murphy's sign. Common bile duct: Diameter: 2.9 mm Liver: Diffuse heterogeneity of liver parenchyma and nodular contour. Small volume of perihepatic ascites. Portal vein is  patent on color Doppler imaging with normal direction of blood flow towards the liver. IMPRESSION: 1. Cholelithiasis.  No secondary findings of acute cholecystitis. 2. Nodular liver contour and heterogeneous echotexture compatible with cirrhosis. 3. Small volume of perihepatic ascites. Electronically Signed   By: Kristine Garbe M.D.   On: 03/20/2018 21:00   US Paracentesis  Result Date: 03/21/2018 INDICATION: Metastatic colorectal cancer. Abdominal discomfort. Small volume ascites noted on prior imaging studies. Request for diagnostic paracentesis. EXAM: ULTRASOUND GUIDED LEFT UPPER QUADRANT PARACENTESIS MEDICATIONS: None. COMPLICATIONS: None immediate. PROCEDURE: Informed written consent was obtained from the patient after a discussion of the risks, benefits and alternatives to treatment. A timeout was performed prior to the initiation of the procedure. Initial ultrasound scanning demonstrates a small amount of ascites within the left upper abdominal quadrant. The left upper abdomen was prepped and draped in the usual sterile fashion. 1% lidocaine with epinephrine was used for local anesthesia. Following this, a 19 gauge, 7-cm, Yueh catheter was introduced. An ultrasound image was saved for documentation purposes. The paracentesis was performed. The catheter was removed and a dressing was applied. The patient tolerated the procedure well without immediate post procedural complication. FINDINGS: A total of approximately only 120 mL of clear, golden fluid was removed. Samples were sent to the laboratory as requested by the clinical team. IMPRESSION: Successful ultrasound-guided paracentesis yielding 120 mL of peritoneal fluid. Read by: Ascencion Dike PA-C Electronically Signed   By: Jacqulynn Cadet M.D.   On: 03/21/2018 13:32   Dg Chest Portable 1 View  Result Date: 03/20/2018 CLINICAL DATA:  Cough. Altered mental status. History of stage IV colon carcinoma. EXAM: PORTABLE CHEST 1 VIEW COMPARISON:   07/28/2012 FINDINGS: Right lung base opacity partly silhouettes the right hemidiaphragm. Remainder of the lungs is clear. Cardiac silhouette is normal in size. No mediastinal or hilar masses. Right anterior chest wall power Port-A-Cath has its tip in the mid superior vena cava. Skeletal structures are intact. IMPRESSION: 1. Right lung base opacity consistent with pneumonia. Electronically Signed   By: Lajean Manes M.D.   On: 03/20/2018 17:41   Ir Abdomen US Limited  Result Date: 03/26/2018 CLINICAL DATA:  Metastatic colon cancer, assess for paracentesis EXAM: LIMITED ABDOMEN ULTRASOUND FOR ASCITES TECHNIQUE: Limited ultrasound survey for ascites was performed in all four abdominal quadrants. COMPARISON:  03/21/2018 FINDINGS: Survey of the abdomen demonstrates a small amount of perihepatic ascites. No large volume of ascites within the abdominal 4 quadrants to warrant therapeutic paracentesis. Procedure not performed. Diffuse liver metastases noted. IMPRESSION: Small volume ascites. Not enough to warrant therapeutic paracentesis. Electronically Signed   By: Jerilynn Mages.  Shick M.D.   On: 03/26/2018 12:52       Subjective: Patient seen and examined at the bedside. No active issues at present except for the right lower extremity weakness.No nausea,vomiting or abdominal pain. We discussed about the discharge planning today to home with Hospice.   Discharge Exam: Vitals:   03/29/18 0300 03/29/18 0815  BP: 109/64 114/74  Pulse: 86 95  Resp: 18 (!) 28  Temp: 99.2 F (37.3 C) 97.8 F (36.6 C)  SpO2: 97% 96%   Vitals:   03/28/18 1609 03/28/18 2218 03/29/18 0300 03/29/18 0815  BP: 118/73 120/82 109/64 114/74  Pulse: 86 (!) 122 86 95  Resp: 18 18 18  (!) 28  Temp: 97.9 F (36.6 C) (!) 97.3 F (36.3 C) 99.2 F (37.3 C) 97.8 F (36.6 C)  TempSrc: Oral Oral Oral Oral  SpO2: 98% 91% 97% 96%  Weight:      Height:        General: Pt is alert, awake, not in acute distress Cardiovascular: RRR, S1/S2 +,  no rubs, no gallops Respiratory: CTA bilaterally, no wheezing, no rhonchi Abdominal: Soft, Mildly distended, ND, bowel sounds + Extremities: 1+ edema on the lower extremities, no cyanosis    The results of significant diagnostics from this hospitalization (including imaging, microbiology, ancillary and laboratory) are listed below for reference.     Microbiology: Recent Results (from the past 240 hour(s))  Culture, blood (routine x 2)     Status: None   Collection Time: 03/20/18  5:49 PM  Result Value Ref Range Status   Specimen Description   Final    BLOOD RIGHT ANTECUBITAL Performed at [facility] [address] [place], [place] [address]    Special Requests   Final    BOTTLES DRAWN AEROBIC AND ANAEROBIC Blood Culture adequate volume Performed at [facility] [address] [place], [place] [address]    Culture   Final    NO GROWTH 5 DAYS Performed at [facility], [place] [address], [place], [place] [address]    Report Status 03/25/2018 FINAL  Final  Culture, blood (routine x 2)     Status: None   Collection Time: 03/20/18  5:59 PM  Result Value Ref Range Status   Specimen Description   Final    BLOOD RIGHT ANTECUBITAL Performed at [facility] [address] [place], [place] [address]    Special Requests  Final    BOTTLES DRAWN AEROBIC AND ANAEROBIC Blood Culture adequate volume Performed at [facility] [address], [place], [place] [address]    Culture   Final    NO GROWTH 5 DAYS Performed at [facility], [place] [address] [place], [place] [address]    Report Status 03/25/2018 FINAL  Final  Respiratory Panel by PCR     Status: None   Collection Time: 03/21/18  7:50 AM  Result Value Ref Range Status   Adenovirus NOT DETECTED NOT DETECTED Final   Coronavirus 229E NOT DETECTED NOT DETECTED Final   Coronavirus HKU1 NOT DETECTED NOT DETECTED Final   Coronavirus NL63 NOT  DETECTED NOT DETECTED Final   Coronavirus OC43 NOT DETECTED NOT DETECTED Final   Metapneumovirus NOT DETECTED NOT DETECTED Final   Rhinovirus / Enterovirus NOT DETECTED NOT DETECTED Final   Influenza A NOT DETECTED NOT DETECTED Final   Influenza B NOT DETECTED NOT DETECTED Final   Parainfluenza Virus 1 NOT DETECTED NOT DETECTED Final   Parainfluenza Virus 2 NOT DETECTED NOT DETECTED Final   Parainfluenza Virus 3 NOT DETECTED NOT DETECTED Final   Parainfluenza Virus 4 NOT DETECTED NOT DETECTED Final   Respiratory Syncytial Virus NOT DETECTED NOT DETECTED Final   Bordetella pertussis NOT DETECTED NOT DETECTED Final   Chlamydophila pneumoniae NOT DETECTED NOT DETECTED Final   Mycoplasma pneumoniae NOT DETECTED NOT DETECTED Final    Comment: Performed at [facility], [place] [address], [place], [place] [address]  Culture, body fluid-bottle     Status: None   Collection Time: 03/21/18  1:42 PM  Result Value Ref Range Status   Specimen Description PERITONEAL  Final   Special Requests NONE  Final   Culture   Final    NO GROWTH 5 DAYS Performed at [facility], [address], [place], [place] [address]    Report Status 03/26/2018 FINAL  Final  Gram stain     Status: None   Collection Time: 03/21/18  1:42 PM  Result Value Ref Range Status   Specimen Description PERITONEAL  Final   Special Requests NONE  Final   Gram Stain   Final    RARE WBC PRESENT, PREDOMINANTLY PMN NO ORGANISMS SEEN Performed at [facility], [place] [address], [place], [place] [address]    Report Status 03/22/2018 FINAL  Final  Culture, Urine     Status: None   Collection Time: 03/21/18  4:39 PM  Result Value Ref Range Status   Specimen Description   Final    URINE, RANDOM Performed at [facility] [address], [place], [place] [address]    Special Requests   Final    NONE Performed at [facility], [place] [address] [place], [place] [address]    Culture    Final    NO GROWTH Performed at [facility], [place] [address] [place], [place] [address]    Report Status 03/23/2018 FINAL  Final  MRSA PCR Screening     Status: None   Collection Time: 03/23/18  1:42 AM  Result Value Ref Range Status   MRSA by PCR NEGATIVE NEGATIVE Final    Comment:        The GeneXpert MRSA Assay (FDA approved for NASAL specimens only), is one component of a comprehensive MRSA colonization surveillance program. It is not intended to diagnose MRSA infection nor to guide or monitor treatment for MRSA infections.  Performed at [facility], [place] [address], [place], [place] [address]      Labs: BNP (last 3 results) No results for input(s): BNP in the last 8760 hours. Basic Metabolic Panel: Recent Labs  Lab 03/22/18 1634 03/24/18 0317 03/25/18 0630 03/27/18 0540 03/29/18 0304  NA 129* 129* 133* 134* 136  K 3.6 3.4* 4.1 3.2* 3.7  CL 93* 97* 98 100 103  CO2 19* 20* 23 24 25   GLUCOSE 110* 126* 88 104* 89  BUN 15 11 11 9 15   CREATININE 0.50 0.54 0.58 0.55 0.67  CALCIUM 8.7* 8.6* 8.5* 8.3* 8.4*   Liver Function Tests: Recent Labs  Lab 03/22/18 1634 03/24/18 0317  AST 140* 113*  ALT 160* 134*  ALKPHOS 513* 533*  BILITOT 3.9* 2.7*  PROT 7.5 6.6  ALBUMIN 2.8* 2.3*   No results for input(s): LIPASE, AMYLASE in the last 168 hours. Recent Labs  Lab 03/22/18 1627  AMMONIA 24   CBC: Recent Labs  Lab 03/24/18 0317  WBC 9.1  HGB 8.9*  HCT 27.6*  MCV 94.5  PLT 59*   Cardiac Enzymes: No results for input(s): CKTOTAL, CKMB, CKMBINDEX, TROPONINI in the last 168 hours. BNP: Invalid input(s): POCBNP CBG: No results for input(s): GLUCAP in the last 168 hours. D-Dimer No results for input(s): DDIMER in the last 72 hours. Hgb A1c No results for input(s): HGBA1C in the last 72 hours. Lipid Profile No results for input(s): CHOL, HDL, LDLCALC, TRIG, CHOLHDL, LDLDIRECT in the last 72 hours. Thyroid function studies No results for  input(s): TSH, T4TOTAL, T3FREE, THYROIDAB in the last 72 hours.  Invalid input(s): FREET3 Anemia work up No results for input(s): VITAMINB12, FOLATE, FERRITIN, TIBC, IRON, RETICCTPCT in the last 72 hours. Urinalysis    Component Value Date/Time   COLORURINE AMBER (A) 03/20/2018 1714   APPEARANCEUR CLEAR 03/20/2018 1714   LABSPEC 1.018 03/20/2018 1714   PHURINE 5.0 03/20/2018 1714   GLUCOSEU NEGATIVE 03/20/2018 1714   HGBUR NEGATIVE 03/20/2018 1714   BILIRUBINUR NEGATIVE 03/20/2018 1714   BILIRUBINUR neg 03/26/2015 1539   KETONESUR 5 (A) 03/20/2018 1714   PROTEINUR NEGATIVE 03/20/2018 1714   UROBILINOGEN 0.2 03/26/2015 1539   NITRITE NEGATIVE 03/20/2018 1714   LEUKOCYTESUR NEGATIVE 03/20/2018 1714   Sepsis Labs Invalid input(s): PROCALCITONIN,  WBC,  LACTICIDVEN Microbiology Recent Results (from the past 240 hour(s))  Culture, blood (routine x 2)     Status: None   Collection Time: 03/20/18  5:49 PM  Result Value Ref Range Status   Specimen Description   Final    BLOOD RIGHT ANTECUBITAL Performed at [facility], [place] [address], [place], [place] [address]    Special Requests   Final    BOTTLES DRAWN AEROBIC AND ANAEROBIC Blood Culture adequate volume Performed at [place] [address], [place], [place] [address]    Culture   Final    NO GROWTH 5 DAYS Performed at [facility], [place] [address], [place], [place] [address]    Report Status 03/25/2018 FINAL  Final  Culture, blood (routine x 2)     Status: None   Collection Time: 03/20/18  5:59 PM  Result Value Ref Range Status   Specimen Description   Final    BLOOD RIGHT ANTECUBITAL Performed at [place] [address], [place], [place] [address]    Special Requests   Final    BOTTLES DRAWN AEROBIC AND ANAEROBIC Blood Culture adequate volume Performed  at [facility], [place] [address] [place], [place] [address]    Culture    Final    NO GROWTH 5 DAYS Performed at [facility], [place] [address], [place], [place] [address]    Report Status 03/25/2018 FINAL  Final  Respiratory Panel by PCR     Status: None   Collection Time: 03/21/18  7:50 AM  Result Value Ref Range Status   Adenovirus NOT DETECTED NOT DETECTED Final   Coronavirus 229E NOT DETECTED NOT DETECTED Final   Coronavirus HKU1 NOT DETECTED NOT DETECTED Final   Coronavirus NL63 NOT DETECTED NOT DETECTED Final   Coronavirus OC43 NOT DETECTED NOT DETECTED Final   Metapneumovirus NOT DETECTED NOT DETECTED Final   Rhinovirus / Enterovirus NOT DETECTED NOT DETECTED Final   Influenza A NOT DETECTED NOT DETECTED Final   Influenza B NOT DETECTED NOT DETECTED Final   Parainfluenza Virus 1 NOT DETECTED NOT DETECTED Final   Parainfluenza Virus 2 NOT DETECTED NOT DETECTED Final   Parainfluenza Virus 3 NOT DETECTED NOT DETECTED Final   Parainfluenza Virus 4 NOT DETECTED NOT DETECTED Final   Respiratory Syncytial Virus NOT DETECTED NOT DETECTED Final   Bordetella pertussis NOT DETECTED NOT DETECTED Final   Chlamydophila pneumoniae NOT DETECTED NOT DETECTED Final   Mycoplasma pneumoniae NOT DETECTED NOT DETECTED Final    Comment: Performed at [facility], [place] [address] [place], [place] [address]  Culture, body fluid-bottle     Status: None   Collection Time: 03/21/18  1:42 PM  Result Value Ref Range Status   Specimen Description PERITONEAL  Final   Special Requests NONE  Final   Culture   Final    NO GROWTH 5 DAYS Performed at [facility], [address] [place], [place] [address]    Report Status 03/26/2018 FINAL  Final  Gram stain     Status: None   Collection Time: 03/21/18  1:42 PM  Result Value Ref Range Status   Specimen Description PERITONEAL  Final   Special Requests NONE  Final   Gram Stain   Final    RARE WBC PRESENT, PREDOMINANTLY PMN NO ORGANISMS SEEN Performed at [facility], [place] [address] [place], [place]  [address]    Report Status 03/22/2018 FINAL  Final  Culture, Urine     Status: None   Collection Time: 03/21/18  4:39 PM  Result Value Ref Range Status   Specimen Description   Final    URINE, RANDOM Performed at [place] [address], [place], [place] [address]    Special Requests   Final    NONE Performed at [facility], [place] [address], [place], [place] [address]    Culture   Final    NO GROWTH Performed at [facility], [place] [address], [place], [place] [address]    Report Status 03/23/2018 FINAL  Final  MRSA PCR Screening     Status: None   Collection Time: 03/23/18  1:42 AM  Result Value Ref Range Status   MRSA by PCR NEGATIVE NEGATIVE Final    Comment:        The GeneXpert MRSA Assay (FDA approved for NASAL specimens only), is one component of a comprehensive MRSA colonization surveillance program. It is not intended to diagnose MRSA infection nor to guide or monitor treatment for MRSA infections. Performed at [facility], [place] [address], [place], [place] [address]  Please note: You were cared for by a hospitalist during your hospital stay. Once you are discharged, your primary care physician will handle any further medical issues. Please note that NO REFILLS for any discharge medications will be authorized once you are discharged, as it is imperative that you return to your primary care physician (or establish a relationship with a primary care physician if you do not have one) for your post hospital discharge needs so that they can reassess your need for medications and monitor your lab values.    Time coordinating discharge: 40 minutes  SIGNED:   Shelly Coss, MD  [facility] 03/29/2018, 8:39 AM Pager [phone]  If 7PM-7AM, please contact night-coverage [email or web address] Password [email or web address]

## 2018-03-29 NOTE — Progress Notes (Signed)
Daily Progress Note   Patient Name: Brittney Young       Date: 03/29/2018 DOB: Dec 06, 1970  Age: 47 y.o. MRN#: [id number] Attending Physician: Brittney Coss, MD Primary Care Physician: Brittney Young Admit Date: 03/20/2018  Reason for Consultation/Follow-up: Establishing goals of care and Psychosocial/spiritual support  Subjective: Brittney Young complains of fatigue and leg stiffness and pain today.  Brittney Young has several questions.  Brittney Young asks where she can buy our Young pillows - they are comfortable 2.  She asks about physical therapy again.  I explained that with hospice and a goal of comfort official physical therapy is not covered by insurance, but the family can walk with Brittney Young or help her do range of motion exercises.   3.  She asks about oncology follow up.  I explain that the Oncologist would likely be very Young to see Brittney Young but further chemotherapy is not recommended.  Brittney Young's primary medical care will by provided thru her Hospice doctor.  Brittney Young from Hospice will call Brittney Young with the name of Brittney Young doctor. 4.  Brittney Young asked if Hospice would be there to give Brittney Young her medications.  When I explained that Hospice normally teaches the family how to give medications, Brittney Young sighed and said - this is going to be [name]. I talked with Brittney Young about transitioning Brittney Young to Brittney Young when she declines.  I encouraged her to be in close contact with Hospice.  Assessment: 47 yo female with widespread metastatic colon cancer to liver, lungs, pancreas, with peritoneal carcinomatosis.  Unable to tolerate chemotherapy.  Admitted with seizures and encephalopathy. Declining rapidly.   Patient Profile/HPI:  47 y.o. female  with past medical history of metastatic colon  cancer (diagnosed 04/2017) with mets to the liver and lung, left breast mass, HTN, depression and anxiety who was admitted on 03/20/2018 with encephalopathy x 3 days.  She had also been having bilateral LE weakness with pain in her right knee.  Work up revealed right lung opacity c/w PNA,  slightly elevated ammonia, small left cerebellar stroke and likely seizure activity.  Peritoneal fluid is significant for elevated lactate dehydrogenase (3055).        Length of Stay: 9  Current Medications: Scheduled Meds:  . citalopram  20 mg Oral Daily  . clotrimazole  1 Applicatorful Vaginal  QHS  . dexamethasone  4 mg Oral Daily  . furosemide  20 mg Oral Daily  . gabapentin  100 mg Oral TID  . LORazepam  0.26 mg Oral BID  . metoprolol tartrate  50 mg Oral BID  . ondansetron (ZOFRAN) IV  4 mg Intravenous TID AC & HS  . potassium chloride  20 mEq Oral Daily  . sodium chloride flush  10-40 mL Intracatheter Q12H    Continuous Infusions: . levETIRAcetam 500 mg (03/29/18 0848)    PRN Meds: HYDROcodone-acetaminophen, LORazepam, LORazepam, morphine CONCENTRATE, ondansetron (ZOFRAN) IV, ondansetron, phenol, sodium chloride flush  Physical Exam        Well developed female, appears fatigued.  Soft spoken, A&O CV rrr Resp no distress Abdomen soft, distended, NT to light palpation  Vital Signs: BP 92/63   Pulse 92   Temp 97.8 F (36.6 C) (Oral)   Resp (!) 28   Ht 5\' 4"  (1.626 m)   Wt 89.5 kg (197 lb 5 oz)   LMP  (LMP Unknown)   SpO2 96%   BMI 33.87 kg/m  SpO2: SpO2: 96 % O2 Device: O2 Device: Room Air O2 Flow Rate:    Intake/output summary:   Intake/Output Summary (Last 24 hours) at 03/29/2018 1041 Last data filed at 03/28/2018 2232 Gross per 24 hour  Intake 130 ml  Output 1 ml  Net 129 ml   LBM: Last BM Date: 03/26/18 Baseline Weight: Weight: 88.9 kg (196 lb) Most recent weight: Weight: 89.5 kg (197 lb 5 oz)       Palliative Assessment/Data:  40%    Flowsheet Rows     Most  Recent Value  Intake Tab  Referral Department  Hospitalist  Unit at Time of Referral  Other (Comment) [telementry/urology]  Palliative Care Primary Diagnosis  Cancer  Date Notified  03/22/18  Palliative Care Type  New Palliative care  Reason for referral  Clarify Goals of Care  Date of Admission  03/20/18  Date first seen by Palliative Care  03/23/18  # of days Palliative referral response time  1 Day(s)  # of days IP prior to Palliative referral  2  Clinical Assessment  Psychosocial & Spiritual Assessment  Palliative Care Outcomes      Patient Active Problem List   Diagnosis Date Noted  . Acute metabolic encephalopathy 09/38/1829  . Cirrhosis ([place]) 03/23/2018  . Thrombocytopenia ([place]) 03/23/2018  . Abnormal liver function   . Seizures ([place])   . Bilateral leg weakness 03/11/2018  . Metastatic colon cancer in female [name]) 05/05/2017  . Dehydration 10/03/2016  . Severe episode of recurrent major depressive disorder, without psychotic features ([place]) 10/03/2016  . TMJ (temporomandibular joint syndrome) 05/07/2016  . Cephalalgia 05/07/2016  . Knee pain, left 09/04/2015  . Postlaminectomy syndrome of lumbar region 04/02/2015  . Anxiety and depression 12/18/2014  . Anemia 07/03/2014  . Vitamin D deficiency 07/03/2014  . Other fatigue 07/03/2014  . Essential hypertension, benign 06/30/2014  . Genital herpes 06/30/2014    Palliative Care Plan    Recommendations/Plan:  Home with hospice today.  Please send home with decadron 4mg  daily, morphine concentrate sublingual, ativan solution low dose scheduled, keppra, lasix and celexa.  Goals of Care and Additional Recommendations:  Limitations on Scope of Treatment: Full Comfort Care  Code Status:  Full code  Prognosis:  Difficult to determine at this point.  She is at high risk for a rapid decline or event that would take her life suddenly.  Awilda Bill a sudden  event she likely has weeks.  Discharge Planning:  Home with  Hospice  Care plan was discussed with patient and family.  Thank you for allowing the Palliative Medicine Team to assist in the care of this patient.  Total time spent:  35 min.     Greater than 50%  of this time was spent counseling and coordinating care related to the above assessment and plan.  Brittney Jenny, PA-C Palliative Medicine  Please contact Palliative MedicineTeam phone at [phone] for questions and concerns between 7 am - 7 pm.   Please see AMION for individual provider pager numbers.

## 2018-03-29 NOTE — Progress Notes (Signed)
Patient was sleeping and did not want to disturb.  Her cousin was visiting and we went out to the hall to pray so patient would be able to rest. Brittney Young, [place]   03/29/18 1100  Clinical Encounter Type  Visited With Family  Visit Type Spiritual support;Critical Care  Referral From Physician  Consult/Referral To Chaplain  Spiritual Encounters  Spiritual Needs Prayer;Emotional  Stress Factors  Patient Stress Factors Other (Comment) (hospice care)  Family Stress Factors Other (Comment) (sad )

## 2018-03-30 ENCOUNTER — Other Ambulatory Visit: Payer: Self-pay

## 2018-03-30 DIAGNOSIS — I10 Essential (primary) hypertension: Secondary | ICD-10-CM

## 2018-03-30 DIAGNOSIS — D509 Iron deficiency anemia, unspecified: Secondary | ICD-10-CM

## 2018-03-30 DIAGNOSIS — M961 Postlaminectomy syndrome, not elsewhere classified: Secondary | ICD-10-CM

## 2018-03-30 MED ORDER — PREGABALIN 25 MG PO CAPS: 25.0000 mg | ORAL_CAPSULE | Freq: Two times a day (BID) | ORAL | 3 refills | Status: AC

## 2018-03-30 MED ORDER — POTASSIUM CHLORIDE CRYS ER 20 MEQ PO TBCR: 20.0000 meq | EXTENDED_RELEASE_TABLET | Freq: Every day | ORAL | 5 refills | Status: AC

## 2018-03-30 MED ORDER — FERROUS FUMARATE 324 (106 FE) MG PO TABS: 1.0000 | ORAL_TABLET | Freq: Two times a day (BID) | ORAL | 3 refills | Status: AC

## 2018-03-30 NOTE — Telephone Encounter (Signed)
Hospice called for Dr Madilyn Fireman to fill three medications. Please advise.

## 2018-03-31 NOTE — Telephone Encounter (Signed)
Patient will call back about the pharmacy location.

## 2018-04-02 ENCOUNTER — Telehealth: Payer: Self-pay | Admitting: Physician Assistant

## 2018-04-02 NOTE — Telephone Encounter (Signed)
Go ahead and cut the metoprolol in half and take half a tab twice a day.  If BP is under 110 then ok to hold the next dose of medication.    Continue current dose of lasix.  Only increae to 2 tabs if edema increases to 2+.    Beatrice Lecher, MD

## 2018-04-02 NOTE — Telephone Encounter (Signed)
Nurse notified  

## 2018-04-02 NOTE — Telephone Encounter (Signed)
Jane from Hospice called with the following questions from the Pt's family. She was recently discharged from the hospital and placed with Hospice.  1. The family is concerned about her blood pressure getting too low. She is currently prescribed metoprolol 50mg  BID. They do have a home BP cuff. They want to know what BP range she should be in, and if it is below what number should they hold the second dose?   2. Pt is prescribed Lasix 20mg  daily. Per Nurse she has +1 pitting edema in her feet. Question if this should be increased or stay on current dose.  Routing to covering Physician in office for review.

## 2018-04-21 ENCOUNTER — Other Ambulatory Visit: Payer: Self-pay

## 2018-04-21 ENCOUNTER — Emergency Department (HOSPITAL_COMMUNITY)
Admission: EM | Admit: 2018-04-21 | Discharge: 2018-05-09 | Disposition: E | Payer: BLUE CROSS/BLUE SHIELD | Attending: Emergency Medicine | Admitting: Emergency Medicine

## 2018-04-21 ENCOUNTER — Emergency Department (HOSPITAL_COMMUNITY): Payer: BLUE CROSS/BLUE SHIELD

## 2018-04-21 ENCOUNTER — Encounter (HOSPITAL_COMMUNITY): Payer: Self-pay | Admitting: Emergency Medicine

## 2018-04-21 DIAGNOSIS — I469 Cardiac arrest, cause unspecified: Secondary | ICD-10-CM | POA: Diagnosis not present

## 2018-04-21 DIAGNOSIS — J189 Pneumonia, unspecified organism: Secondary | ICD-10-CM | POA: Diagnosis not present

## 2018-04-21 DIAGNOSIS — I10 Essential (primary) hypertension: Secondary | ICD-10-CM | POA: Insufficient documentation

## 2018-04-21 DIAGNOSIS — A419 Sepsis, unspecified organism: Secondary | ICD-10-CM | POA: Insufficient documentation

## 2018-04-21 DIAGNOSIS — R0602 Shortness of breath: Secondary | ICD-10-CM | POA: Diagnosis present

## 2018-04-21 DIAGNOSIS — R092 Respiratory arrest: Secondary | ICD-10-CM

## 2018-04-21 DIAGNOSIS — Z85038 Personal history of other malignant neoplasm of large intestine: Secondary | ICD-10-CM | POA: Insufficient documentation

## 2018-04-21 DIAGNOSIS — Z79899 Other long term (current) drug therapy: Secondary | ICD-10-CM | POA: Diagnosis not present

## 2018-04-21 LAB — URINALYSIS, ROUTINE W REFLEX MICROSCOPIC
Glucose, UA: 50 mg/dL — AB
Ketones, ur: NEGATIVE mg/dL
Leukocytes, UA: NEGATIVE
Nitrite: NEGATIVE
PROTEIN: 100 mg/dL — AB
SPECIFIC GRAVITY, URINE: 1.017 (ref 1.005–1.030)
pH: 5 (ref 5.0–8.0)

## 2018-04-21 LAB — CBG MONITORING, ED: GLUCOSE-CAPILLARY: 53 mg/dL — AB (ref 70–99)

## 2018-04-21 MED ORDER — EPINEPHRINE PF 1 MG/10ML IJ SOSY
PREFILLED_SYRINGE | INTRAMUSCULAR | Status: AC | PRN
  Administered 2018-04-21: 1 mg via INTRAVENOUS

## 2018-04-21 MED ORDER — EPINEPHRINE PF 1 MG/10ML IJ SOSY
PREFILLED_SYRINGE | INTRAMUSCULAR | Status: AC | PRN
  Administered 2018-04-21 (×4): 1 mg via INTRAVENOUS

## 2018-04-21 MED ORDER — DEXTROSE 50 % IV SOLN
INTRAVENOUS | Status: AC | PRN
  Administered 2018-04-21: 25 g via INTRAVENOUS

## 2018-04-21 MED ORDER — CALCIUM CHLORIDE 10 % IV SOLN
INTRAVENOUS | Status: AC | PRN
  Administered 2018-04-21: 1 g via INTRAVENOUS

## 2018-04-21 MED ORDER — SODIUM CHLORIDE 0.9 % IV BOLUS: 1000.0000 mL | Freq: Once | INTRAVENOUS | Status: DC

## 2018-04-21 MED FILL — Medication: Qty: 1 | Status: AC

## 2018-04-24 LAB — URINE CULTURE: Culture: >=100000 — AB

## 2018-05-09 NOTE — ED Notes (Signed)
Brittney Young and [facility] has been contacted.

## 2018-05-09 NOTE — Code Documentation (Signed)
Pulse check. Compressions continued

## 2018-05-09 NOTE — Progress Notes (Signed)
Home Care SW made visit to Brittney Young ED after learning that Patient had been sent to the hospital.  SW arrived and met with Sister, Brittney Young, and several other family.  SW spent time sitting outside the room with family when Patient coded and provided support after family was told she had passed away.  SW explained Bereavement services and provided information regarding [facility] for Patient's 45 year old Son.  Cecilio Asper, [place]

## 2018-05-09 NOTE — Code Documentation (Signed)
Shock. 150 jewels. Pt in v fib.

## 2018-05-09 NOTE — Code Documentation (Signed)
Shock. 150 jewels.

## 2018-05-09 NOTE — Code Documentation (Signed)
IO attempted

## 2018-05-09 NOTE — Code Documentation (Signed)
Pulse check. No pulse. Shock. 200 jewels.

## 2018-05-09 NOTE — ED Triage Notes (Signed)
Patient BIB [facility] from home for SOB. Pt is a hospice pt but full code. Hx of colon and liver ca. Unsure about last treatment.

## 2018-05-09 NOTE — Code Documentation (Addendum)
Code called by Dr Maryan Rued. Time of death 13:31

## 2018-05-09 NOTE — ED Notes (Addendum)
Pt is deceased, Supporting family at 45 yr old son at bedside, chaplain called. Will continue to support family as necessary.

## 2018-05-09 NOTE — Code Documentation (Addendum)
Compressions started. Dr at bedside

## 2018-05-09 NOTE — ED Notes (Signed)
Family requested to wait another 15 minutes for additional family member to arrive before moving patient to [name].

## 2018-05-09 NOTE — ED Notes (Signed)
Rn went in to look at port to possibly attempt access.  RN unable to feel prongs to access [employer], Charge RN attempted to access port x2 without success.

## 2018-05-09 NOTE — ED Notes (Signed)
[facility] (April Shore: Referral agent) Referral Number [id number]  (A few drops of sterile saline in each eye and covered with sterile soaked gauze for corneal transplant)

## 2018-05-09 NOTE — Code Documentation (Signed)
Compressions held. Dr Maryan Rued attempting intubation

## 2018-05-09 NOTE — ED Notes (Signed)
RN asked family if they would be interested in the corneal donation, pt's nephew responded they would not.

## 2018-05-09 NOTE — ED Provider Notes (Signed)
[place] DEPT Provider Note   CSN: [id number] Arrival date & time: May 18, 2018  1140     History   Chief Complaint Chief Complaint  Patient presents with  . Shortness of Breath    HPI Brittney Young is a 47 y.o. female.  Patient is a 47 year old female with a history of metastatic colon cancer to the liver who is currently still living at home with hospice presenting today with family for altered mental status, anorexia, difficulty breathing.  Patient was just discharged on 22 July after having altered mental status and pneumonia.  Family states for the last 3 days since she stopped eating and has become more more confused as well as started to look like she was having difficulty breathing.  Last bowel movement was on 8-19 and she was given stool softeners by hospice but has not had any further bowel movements.  They state mild coughing today but she has not had a persistent cough.  She has had no nausea vomiting.  She did urinate last night but has not had any further urine production today.  Currently they state patient is still a full code however they are discussing if they want to continue that.  She was not complaining of any abdominal discomfort that was out of the ordinary in the last few days before she became altered.  They state normally her baseline is normal mental status.  The history is provided by a relative.    Past Medical History:  Diagnosis Date  . Anxiety   . Colon cancer ([place])   . Depression   . Hypertension     Patient Active Problem List   Diagnosis Date Noted  . Palliative care encounter   . Acute metabolic encephalopathy 64/40/3474  . Cirrhosis ([place]) 03/23/2018  . Thrombocytopenia ([place]) 03/23/2018  . Abnormal liver function   . Seizures ([place])   . Bilateral leg weakness 03/11/2018  . Metastatic colon cancer in female [facility]) 05/05/2017  . Dehydration 10/03/2016  . Severe episode of recurrent major depressive  disorder, without psychotic features ([place]) 10/03/2016  . TMJ (temporomandibular joint syndrome) 05/07/2016  . Cephalalgia 05/07/2016  . Knee pain, left 09/04/2015  . Postlaminectomy syndrome of lumbar region 04/02/2015  . Anxiety and depression 12/18/2014  . Anemia 07/03/2014  . Vitamin D deficiency 07/03/2014  . Other fatigue 07/03/2014  . Essential hypertension, benign 06/30/2014  . Genital herpes 06/30/2014    Past Surgical History:  Procedure Laterality Date  . CESAREAN SECTION       OB History   None      Home Medications    Prior to Admission medications   Medication Sig Start Date End Date Taking? Authorizing Provider  citalopram (CELEXA) 20 MG tablet Take 1 tablet (20 mg total) by mouth daily. 02/26/18   Breeback, Jade L, PA-C  dexamethasone (DECADRON) 4 MG tablet Take 1 tablet (4 mg total) by mouth daily. 03/29/18   Shelly Coss, MD  Ferrous Fumarate (HEMOCYTE - 106 MG FE) 324 (106 Fe) MG TABS tablet Take 1 tablet (106 mg of iron total) by mouth 2 (two) times daily before a meal. 03/30/18   Hali Marry, MD  furosemide (LASIX) 20 MG tablet Take 1 tablet (20 mg total) by mouth daily. 03/29/18   Shelly Coss, MD  KEPPRA 500 MG tablet Take 1 tablet (500 mg total) by mouth 2 (two) times daily. 03/29/18   Shelly Coss, MD  LORazepam (ATIVAN) 2 MG/ML concentrated solution Take 0.3 mLs (  0.6 mg total) by mouth every 6 (six) hours as needed for anxiety or sleep. 03/29/18   Shelly Coss, MD  metoprolol tartrate (LOPRESSOR) 50 MG tablet Take 1 tablet (50 mg total) by mouth 2 (two) times daily. 03/29/18   Shelly Coss, MD  Morphine Sulfate (MORPHINE CONCENTRATE) 10 MG/0.5ML SOLN concentrated solution Place 0.5 mLs (10 mg total) under the tongue every 2 (two) hours as needed for severe pain or shortness of breath. 03/29/18 04/28/18  Shelly Coss, MD  ondansetron (ZOFRAN-ODT) 4 MG disintegrating tablet Take 1 tablet (4 mg total) by mouth every 4 (four) hours as needed  for nausea or vomiting. 03/29/18   Shelly Coss, MD  potassium chloride SA (K-DUR,KLOR-CON) 20 MEQ tablet Take 1 tablet (20 mEq total) by mouth daily with breakfast. 03/30/18   Hali Marry, MD  pregabalin (LYRICA) 25 MG capsule Take 1 capsule (25 mg total) by mouth 2 (two) times daily. 03/30/18   Hali Marry, MD    Family History Family History  Problem Relation Age of Onset  . Stroke Mother   . Cancer Sister   . Migraines Sister     Social History Social History   Tobacco Use  . Smoking status: Never Smoker  . Smokeless tobacco: Never Used  Substance Use Topics  . Alcohol use: No  . Drug use: No     Allergies   Patient has no known allergies.   Review of Systems Review of Systems  Unable to perform ROS: Acuity of condition     Physical Exam Updated Vital Signs BP (!) 120/99   Pulse (!) 102   Temp (!) 101 F (38.3 C) (Rectal)   Resp (!) 27   LMP  (LMP Unknown)   SpO2 98%   Physical Exam  Constitutional: She appears well-developed and well-nourished. She appears toxic. She does not appear ill. No distress.  HENT:  Head: Normocephalic and atraumatic.  Mouth/Throat: Oropharynx is clear and moist.  Dry mucous membranes  Eyes: Pupils are equal, round, and reactive to light. Conjunctivae and EOM are normal.  jaundice  Neck: Normal range of motion. Neck supple.  Cardiovascular: Regular rhythm and intact distal pulses. Tachycardia present.  No murmur heard. Pulmonary/Chest: Tachypnea noted. She has decreased breath sounds. She has no wheezes. She has no rales.  Poor effort  Abdominal: Soft. She exhibits distension. There is no tenderness. There is no rebound and no guarding.  Musculoskeletal: Normal range of motion. She exhibits no edema or tenderness.  Neurological:  Drowsy will occasionally respond  Skin: Skin is warm and dry. No rash noted. No erythema.  Psychiatric:  Altered and encephalopathic  Nursing note and vitals  reviewed.    ED Treatments / Results  Labs (all labs ordered are listed, but only abnormal results are displayed) Labs Reviewed  CULTURE, BLOOD (ROUTINE X 2)  CULTURE, BLOOD (ROUTINE X 2)  COMPREHENSIVE METABOLIC PANEL  CBC WITH DIFFERENTIAL/PLATELET  PROTIME-INR  URINALYSIS, ROUTINE W REFLEX MICROSCOPIC  AMMONIA  I-STAT CG4 LACTIC ACID, ED  I-STAT BETA HCG BLOOD, ED ([facility], [facility], [facility] ONLY)    EKG None  Radiology Dg Chest 2 View  Result Date: 2018-05-02 CLINICAL DATA:  Shortness breath.  History of colon carcinoma EXAM: CHEST - 2 VIEW COMPARISON:  Chest radiograph March 20, 2018 and chest CT March 21, 2018 FINDINGS: There is a left pleural effusion with patchy airspace opacity throughout the left mid and lower lung zones. Lungs elsewhere clear. Heart size and pulmonary vascular normal. No appreciable  adenopathy. Port-A-Cath tip is in the superior vena cava. No pneumothorax. No blastic or lytic bone lesions are evident. IMPRESSION: Left pleural effusion with patchy infiltrate left lower lobe. Right lung clear. Heart size normal. No evident adenopathy. Port-A-Cath tip in superior vena cava. Electronically Signed   By: Lowella Grip III M.D.   On: 27-Apr-2018 13:20    Procedures Procedure Name: Intubation Date/Time: Apr 27, 2018 2:44 PM Performed by: Blanchie Dessert, MD Pre-anesthesia Checklist: Patient identified, Suction available and Patient being monitored Oxygen Delivery Method: Ambu bag Preoxygenation: Pre-oxygenation with 100% oxygen Ventilation: Mask ventilation without difficulty Laryngoscope Size: Mac and 3 Tube size: 7.5 mm Number of attempts: 1 Placement Confirmation: ETT inserted through vocal cords under direct vision,  CO2 detector and Breath sounds checked- equal and bilateral Secured at: 23 cm Tube secured with: ETT holder Dental Injury: Teeth and Oropharynx as per pre-operative assessment  Difficulty Due To: Difficulty was unanticipated      (including  critical care time)  Medications Ordered in ED Medications  sodium chloride 0.9 % bolus 1,000 mL (has no administration in time range)     Initial Impression / Assessment and Plan / ED Course  I have reviewed the triage vital signs and the nursing notes.  Pertinent labs & imaging results that were available during my care of the patient were reviewed by me and considered in my medical decision making (see chart for details).     Patient is a 47 year old chronically ill female with a history of colon cancer that metastasized to her liver who is currently living at home with hospice presenting today by EMS for shortness of breath and altered mental status.  Patient was recently discharged the hospital at the end of July for healthcare associated pneumonia.  She is not currently on any antibiotics and was her normal self until about Saturday.  On Saturday she started having decreased p.o. intake and mild confusion.  This progressed until today when she started having significant signs of shortness of breath.  Patient is not responsive on exam but is breathing spontaneously.  Speaking with the family they states she wishes to be a full code and would be okay with intubation at this point if she were to have difficulty breathing.  Patient is tachypneic with shallow breaths.  Blood pressure and heart rate initially are within normal limits except for some mild tachycardia.  Patient was febrile to 101.  Code sepsis was initiated.  Patient went to x-ray on return from x-ray patient had acute respiratory arrest.  Initiated bagging but patient soon lost pulses.  An IO was placed and she was given multiple rounds of epi as well as calcium and D50 for a blood sugar of 50.  Patient initially went into V. tach and was defibrillated.  She then was in V. fib despite multiple shocks and rounds of epi and CPR.  Within the code patient was intubated with good breath sounds bilaterally.  End-tidal CO2 was 13.  After last  defibrillation patient went into asystole.  Unable to obtain return of circulation.  Time of death was called at 1330.  Death certificate was signed.  Patient would not be a medical examiner case.  Patient's chest x-ray did return with a right-sided pleural effusion and concern for pneumonia.  Suspect patient's cause of death was healthcare associated pneumonia and additional sepsis.  CRITICAL CARE Performed by: Kadeshia Kasparian Total critical care time: 40 minutes Critical care time was exclusive of separately billable procedures and treating other  patients. Critical care was necessary to treat or prevent imminent or life-threatening deterioration. Critical care was time spent personally by me on the following activities: development of treatment plan with patient and/or surrogate as well as nursing, discussions with consultants, evaluation of patient's response to treatment, examination of patient, obtaining history from patient or surrogate, ordering and performing treatments and interventions, ordering and review of laboratory studies, ordering and review of radiographic studies, pulse oximetry and re-evaluation of patient's condition.   Final Clinical Impressions(s) / ED Diagnoses   Final diagnoses:  Respiratory arrest before cardiac arrest [facility])  Cardiac arrest ([place])  HCAP (healthcare-associated pneumonia)  Sepsis, due to unspecified organism [facility])    ED Discharge Orders    None       Blanchie Dessert, MD April 22, 2018 1445

## 2018-05-09 NOTE — Code Documentation (Signed)
Pulse check. Pt in v tach.

## 2018-05-09 NOTE — Code Documentation (Signed)
Bagging started  

## 2018-05-09 NOTE — ED Notes (Signed)
Bed: IY34 Expected date:  Expected time:  Means of arrival:  Comments: EMS -47yo f [facility]- hospice pt

## 2018-05-09 NOTE — Code Documentation (Signed)
Fransico Meadow RRT at bedside

## 2018-05-09 NOTE — ED Notes (Addendum)
Chaplain paged at 13:32.  Responded at 13:40.  Report pt death with family at bedside.    Chaplain providing support with extended family around patient death.  Provided grief support at bedside and in [facility] consult and waiting.   Chaplain provided empathic presence and pastoral support, normalizing grief responses and providing safe space for family to engage in life review.  Provided prayers with family and 45 yo son at request.    Dirk Dress / [facility] Chaplain Jerene Pitch, MDiv, [facility]

## 2018-05-09 NOTE — Code Documentation (Signed)
Pulse check.   

## 2018-05-09 NOTE — Code Documentation (Signed)
IO attempted.

## 2018-05-09 DEATH — deceased

## 2019-11-06 IMAGING — DX DG LUMBAR SPINE COMPLETE 4+V
6 series · 6 of 6 positions shown · non-contrast
Comparison: 02/17/2017

CLINICAL DATA: BILATERAL leg weakness for several weeks, history
metastatic colon cancer on chemotherapy, hypertension

EXAM:
LUMBAR SPINE - COMPLETE 4+ VIEW

[l-spine ap (1 of 2)]
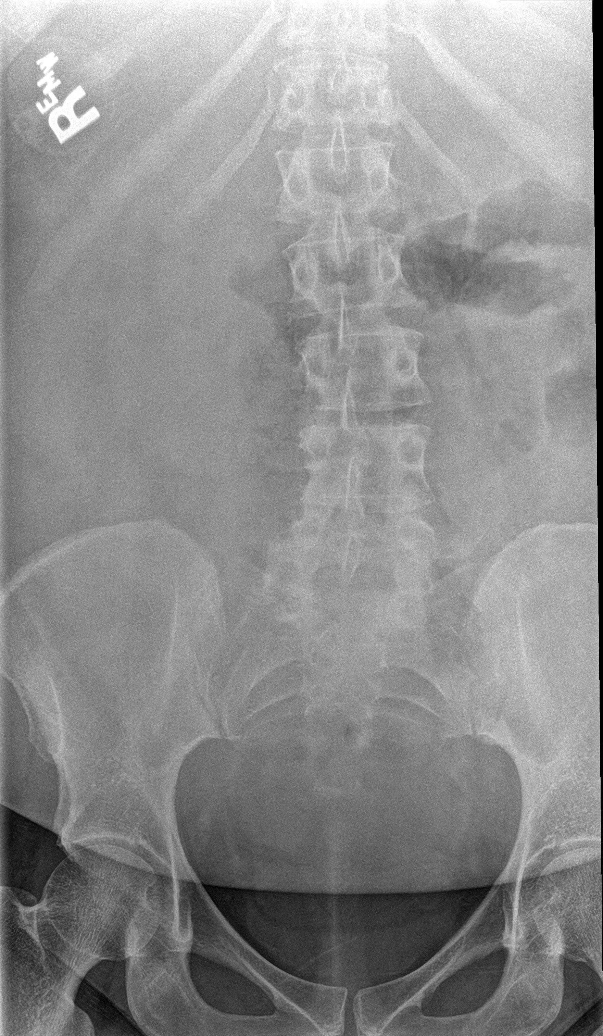

[l-spine obl (1 of 2)]
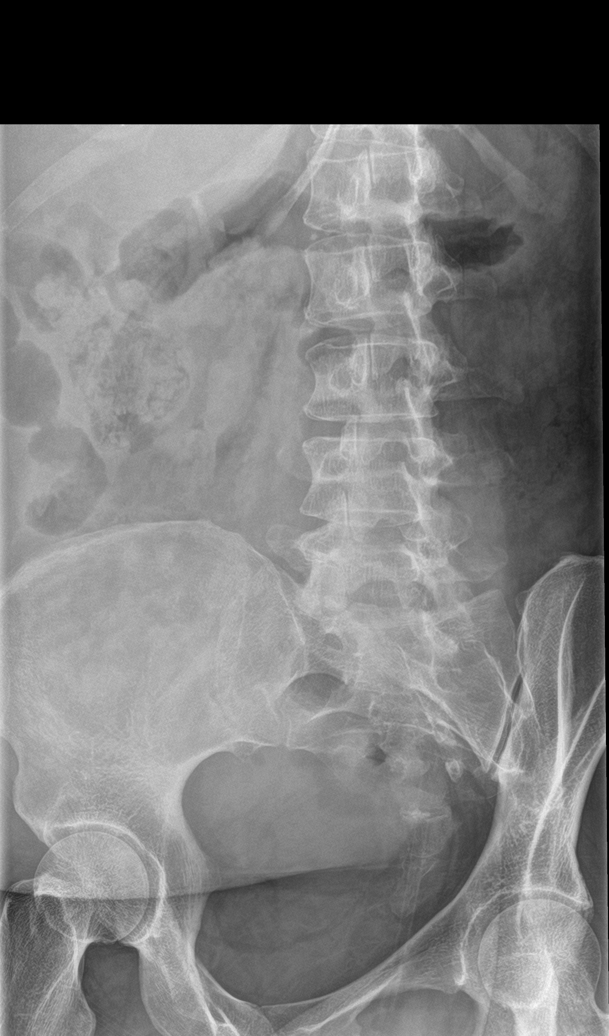

[l-spine obl (2 of 2)]
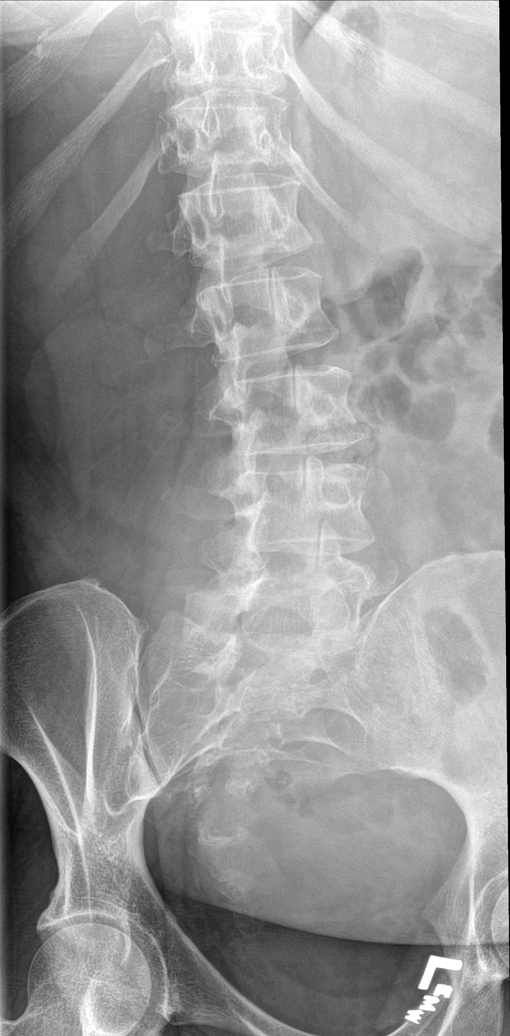

[l-spine lat]
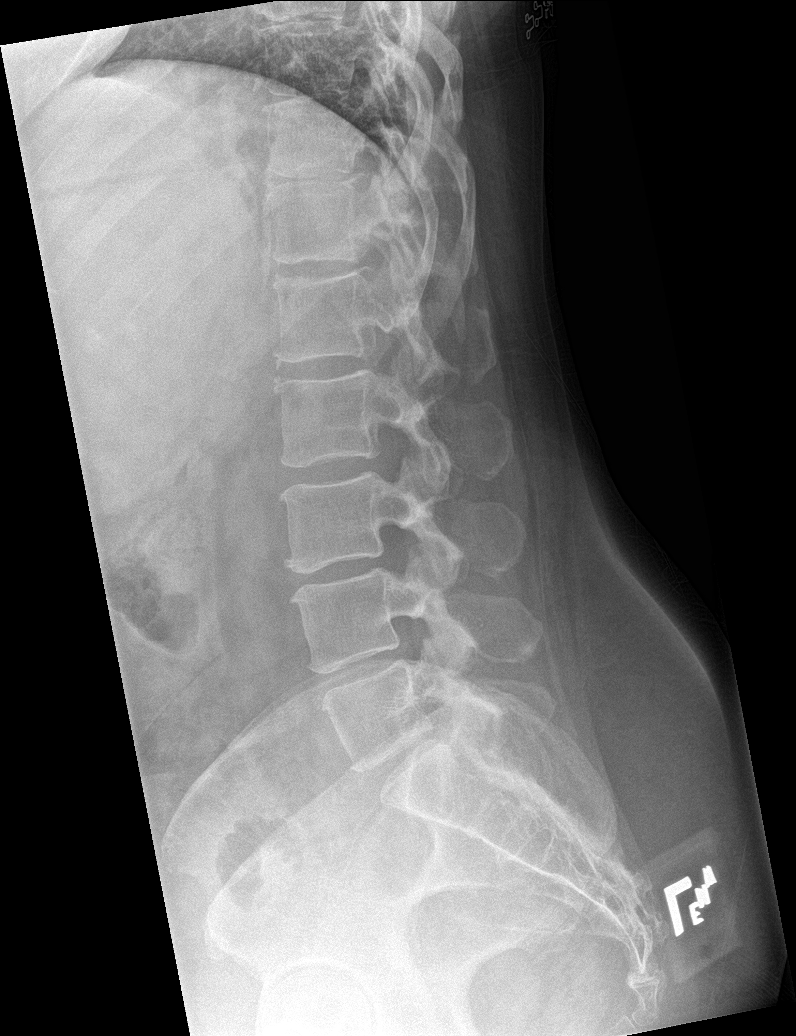

[l-spine spot]
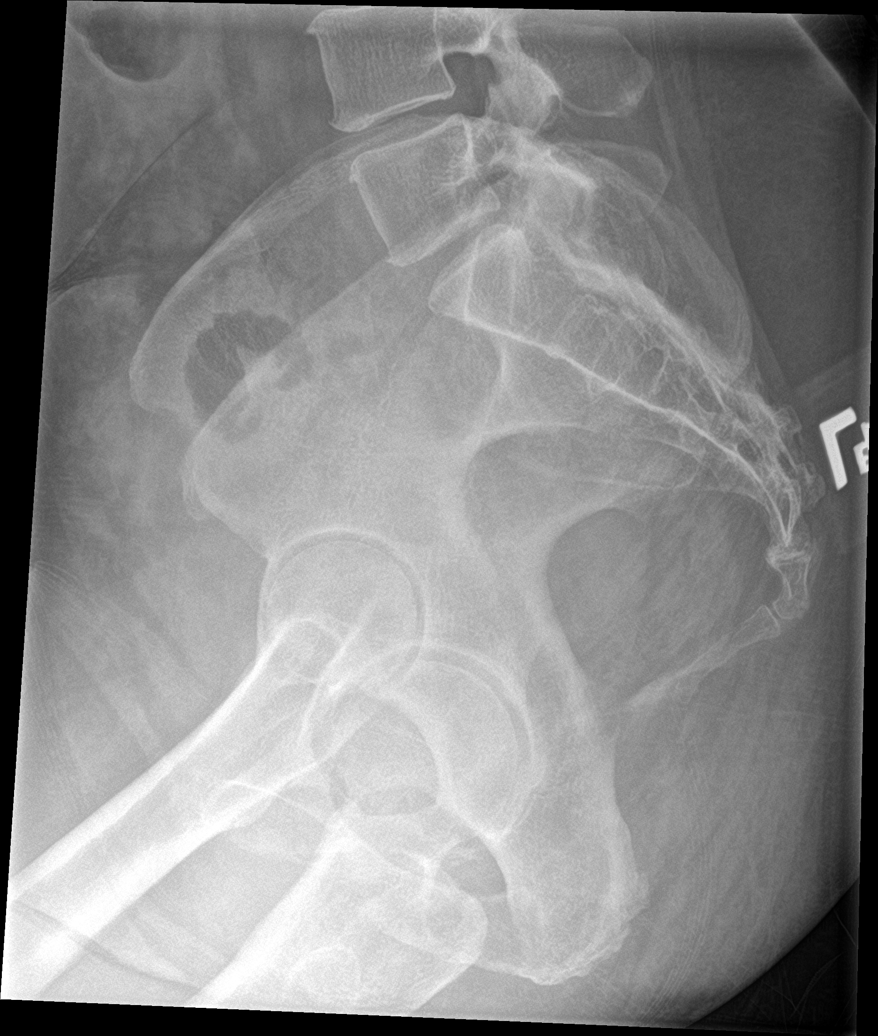

[l-spine ap (2 of 2)]
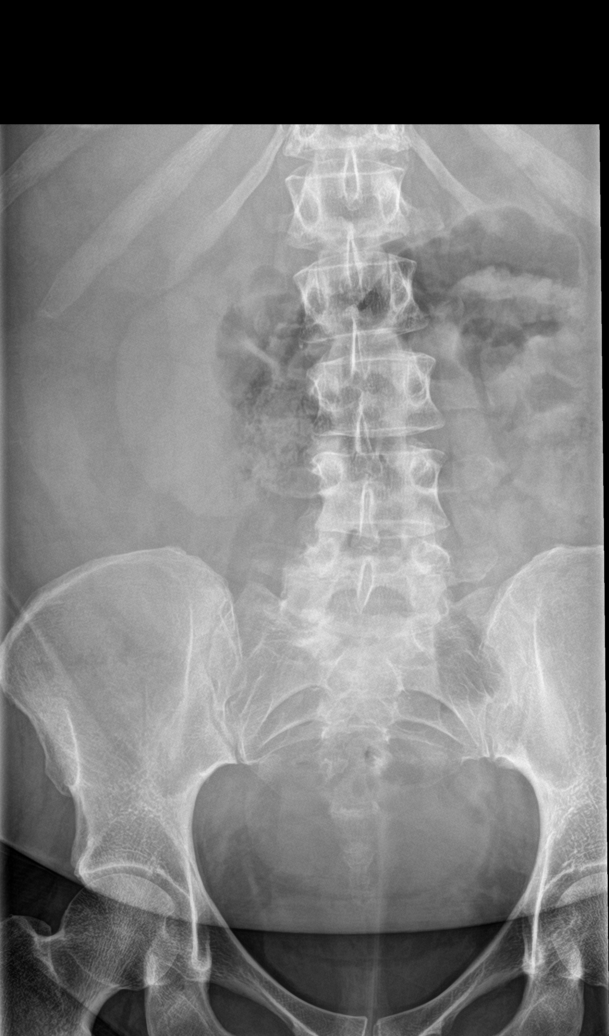

[6 of 6 positions shown; findings below may reference images not displayed]

FINDINGS: 5 non-rib-bearing lumbar vertebra.

Minimal levoconvex lumbar scoliosis unchanged.

Vertebral body and disc space heights maintained.

No acute fracture, subluxation, or bone destruction.

No spondylolysis.

SI joints preserved.
IMPRESSION: No acute osseous abnormalities.
